# Patient Record
Sex: Male | Born: 1950 | Race: White | Hispanic: No | Smoking: Current every day smoker
Health system: Southern US, Community
[De-identification: ages and names within clinical notes are randomized; demographics above are authoritative.]

## PROBLEM LIST (undated history)

## (undated) DIAGNOSIS — L89306 Pressure-induced deep tissue damage of unspecified buttock: Secondary | ICD-10-CM

## (undated) DIAGNOSIS — I10 Essential (primary) hypertension: Secondary | ICD-10-CM

## (undated) DIAGNOSIS — F329 Major depressive disorder, single episode, unspecified: Secondary | ICD-10-CM

## (undated) DIAGNOSIS — E785 Hyperlipidemia, unspecified: Secondary | ICD-10-CM

## (undated) DIAGNOSIS — F32A Depression, unspecified: Secondary | ICD-10-CM

---

## 2014-04-15 ENCOUNTER — Ambulatory Visit: Payer: Self-pay | Admitting: Specialist

## 2014-04-15 DIAGNOSIS — I10 Essential (primary) hypertension: Secondary | ICD-10-CM

## 2014-04-15 LAB — BASIC METABOLIC PANEL
ANION GAP: 5 — AB (ref 7–16)
BUN: 10 mg/dL (ref 7–18)
CREATININE: 0.8 mg/dL (ref 0.60–1.30)
Calcium, Total: 9 mg/dL (ref 8.5–10.1)
Chloride: 104 mmol/L (ref 98–107)
Co2: 30 mmol/L (ref 21–32)
EGFR (African American): >60
EGFR (Non-African Amer.): >60
Glucose: 97 mg/dL (ref 65–99)
Osmolality: 277 (ref 275–301)
Potassium: 4.3 mmol/L (ref 3.5–5.1)
Sodium: 139 mmol/L (ref 136–145)

## 2014-04-15 LAB — PROTIME-INR
INR: 0.9
Prothrombin Time: 12.1 secs (ref 11.5–14.7)

## 2014-04-15 LAB — CBC
HCT: 45 % (ref 40.0–52.0)
HGB: 14.9 g/dL (ref 13.0–18.0)
MCH: 32.2 pg (ref 26.0–34.0)
MCHC: 33.2 g/dL (ref 32.0–36.0)
MCV: 97 fL (ref 80–100)
PLATELETS: 287 10*3/uL (ref 150–440)
RBC: 4.64 10*6/uL (ref 4.40–5.90)
RDW: 14.6 % — ABNORMAL HIGH (ref 11.5–14.5)
WBC: 10.1 10*3/uL (ref 3.8–10.6)

## 2014-04-15 LAB — APTT: ACTIVATED PTT: 25.2 s (ref 23.6–35.9)

## 2014-04-15 LAB — MRSA PCR SCREENING

## 2014-04-17 LAB — URINALYSIS, COMPLETE
BACTERIA: NONE SEEN
BILIRUBIN, UR: NEGATIVE
BLOOD: NEGATIVE
GLUCOSE, UR: NEGATIVE mg/dL (ref 0–75)
Leukocyte Esterase: NEGATIVE
NITRITE: NEGATIVE
Ph: 5 (ref 4.5–8.0)
Protein: NEGATIVE
RBC,UR: NONE SEEN /HPF (ref 0–5)
SPECIFIC GRAVITY: 1.029 (ref 1.003–1.030)
Squamous Epithelial: NONE SEEN
WBC UR: NONE SEEN /HPF (ref 0–5)

## 2018-01-14 ENCOUNTER — Emergency Department: Payer: Medicare PPO

## 2018-01-14 ENCOUNTER — Other Ambulatory Visit: Payer: Self-pay

## 2018-01-14 ENCOUNTER — Observation Stay
Admission: EM | Admit: 2018-01-14 | Discharge: 2018-01-18 | Disposition: A | Discharge status: Home / Self Care | Payer: Medicare PPO | Attending: Specialist | Admitting: Specialist

## 2018-01-14 ENCOUNTER — Encounter: Payer: Self-pay | Admitting: Intensive Care

## 2018-01-14 DIAGNOSIS — R062 Wheezing: Secondary | ICD-10-CM | POA: Diagnosis not present

## 2018-01-14 DIAGNOSIS — M79605 Pain in left leg: Secondary | ICD-10-CM

## 2018-01-14 DIAGNOSIS — I10 Essential (primary) hypertension: Secondary | ICD-10-CM | POA: Insufficient documentation

## 2018-01-14 DIAGNOSIS — Z803 Family history of malignant neoplasm of breast: Secondary | ICD-10-CM | POA: Diagnosis not present

## 2018-01-14 DIAGNOSIS — M25562 Pain in left knee: Secondary | ICD-10-CM | POA: Diagnosis not present

## 2018-01-14 DIAGNOSIS — S82102A Unspecified fracture of upper end of left tibia, initial encounter for closed fracture: Secondary | ICD-10-CM

## 2018-01-14 DIAGNOSIS — F17210 Nicotine dependence, cigarettes, uncomplicated: Secondary | ICD-10-CM | POA: Diagnosis not present

## 2018-01-14 DIAGNOSIS — M8580 Other specified disorders of bone density and structure, unspecified site: Secondary | ICD-10-CM | POA: Insufficient documentation

## 2018-01-14 DIAGNOSIS — G629 Polyneuropathy, unspecified: Secondary | ICD-10-CM | POA: Insufficient documentation

## 2018-01-14 DIAGNOSIS — M1612 Unilateral primary osteoarthritis, left hip: Secondary | ICD-10-CM | POA: Diagnosis not present

## 2018-01-14 DIAGNOSIS — S82192A Other fracture of upper end of left tibia, initial encounter for closed fracture: Principal | ICD-10-CM | POA: Insufficient documentation

## 2018-01-14 DIAGNOSIS — M1712 Unilateral primary osteoarthritis, left knee: Secondary | ICD-10-CM | POA: Diagnosis not present

## 2018-01-14 DIAGNOSIS — E785 Hyperlipidemia, unspecified: Secondary | ICD-10-CM | POA: Diagnosis not present

## 2018-01-14 DIAGNOSIS — Z9181 History of falling: Secondary | ICD-10-CM | POA: Diagnosis not present

## 2018-01-14 DIAGNOSIS — S82832A Other fracture of upper and lower end of left fibula, initial encounter for closed fracture: Secondary | ICD-10-CM | POA: Insufficient documentation

## 2018-01-14 DIAGNOSIS — Y9389 Activity, other specified: Secondary | ICD-10-CM | POA: Diagnosis not present

## 2018-01-14 DIAGNOSIS — S82209A Unspecified fracture of shaft of unspecified tibia, initial encounter for closed fracture: Secondary | ICD-10-CM | POA: Diagnosis present

## 2018-01-14 DIAGNOSIS — Z823 Family history of stroke: Secondary | ICD-10-CM | POA: Insufficient documentation

## 2018-01-14 DIAGNOSIS — Y92009 Unspecified place in unspecified non-institutional (private) residence as the place of occurrence of the external cause: Secondary | ICD-10-CM | POA: Diagnosis not present

## 2018-01-14 DIAGNOSIS — F329 Major depressive disorder, single episode, unspecified: Secondary | ICD-10-CM | POA: Diagnosis not present

## 2018-01-14 DIAGNOSIS — W3189XA Contact with other specified machinery, initial encounter: Secondary | ICD-10-CM | POA: Insufficient documentation

## 2018-01-14 DIAGNOSIS — W19XXXA Unspecified fall, initial encounter: Secondary | ICD-10-CM

## 2018-01-14 DIAGNOSIS — I252 Old myocardial infarction: Secondary | ICD-10-CM | POA: Insufficient documentation

## 2018-01-14 DIAGNOSIS — Z419 Encounter for procedure for purposes other than remedying health state, unspecified: Secondary | ICD-10-CM

## 2018-01-14 HISTORY — DX: Depression, unspecified: F32.A

## 2018-01-14 HISTORY — DX: Hyperlipidemia, unspecified: E78.5

## 2018-01-14 HISTORY — DX: Essential (primary) hypertension: I10

## 2018-01-14 HISTORY — DX: Major depressive disorder, single episode, unspecified: F32.9

## 2018-01-14 LAB — BASIC METABOLIC PANEL
ANION GAP: 12 (ref 5–15)
BUN: 21 mg/dL (ref 8–23)
CALCIUM: 9.1 mg/dL (ref 8.9–10.3)
CO2: 25 mmol/L (ref 22–32)
Chloride: 102 mmol/L (ref 98–111)
Creatinine, Ser: 0.96 mg/dL (ref 0.61–1.24)
GFR calc non Af Amer: >60 mL/min (ref >60)
Glucose, Bld: 111 mg/dL — ABNORMAL HIGH (ref 70–99)
Potassium: 3.8 mmol/L (ref 3.5–5.1)
Sodium: 139 mmol/L (ref 135–145)

## 2018-01-14 LAB — CBC
HEMATOCRIT: 40.6 % (ref 40.0–52.0)
Hemoglobin: 13.6 g/dL (ref 13.0–18.0)
MCH: 32.1 pg (ref 26.0–34.0)
MCHC: 33.4 g/dL (ref 32.0–36.0)
MCV: 96 fL (ref 80.0–100.0)
Platelets: 333 10*3/uL (ref 150–440)
RBC: 4.23 MIL/uL — AB (ref 4.40–5.90)
RDW: 14.3 % (ref 11.5–14.5)
WBC: 16.9 10*3/uL — AB (ref 3.8–10.6)

## 2018-01-14 MED ORDER — ACETAMINOPHEN 325 MG PO TABS: 650.0000 mg | ORAL_TABLET | Freq: Four times a day (QID) | ORAL | Status: DC | PRN

## 2018-01-14 MED ORDER — SODIUM CHLORIDE 0.9% FLUSH
3.0000 mL | Freq: Two times a day (BID) | INTRAVENOUS | Status: DC
  Administered 2018-01-14 – 2018-01-15 (×3): 3 mL via INTRAVENOUS

## 2018-01-14 MED ORDER — DULOXETINE HCL 60 MG PO CPEP
60.0000 mg | ORAL_CAPSULE | Freq: Every day | ORAL | Status: DC
  Administered 2018-01-15 – 2018-01-18 (×3): 60 mg via ORAL
  Filled 2018-01-14 (×4): qty 1

## 2018-01-14 MED ORDER — LISINOPRIL-HYDROCHLOROTHIAZIDE 20-12.5 MG PO TABS: 1.0000 | ORAL_TABLET | Freq: Every day | ORAL | Status: DC

## 2018-01-14 MED ORDER — HYDROMORPHONE HCL 1 MG/ML IJ SOLN
1.0000 mg | INTRAMUSCULAR | Status: AC
  Administered 2018-01-14: 1 mg via INTRAVENOUS
  Filled 2018-01-14: qty 1

## 2018-01-14 MED ORDER — ACETAMINOPHEN 650 MG RE SUPP: 650.0000 mg | Freq: Four times a day (QID) | RECTAL | Status: DC | PRN

## 2018-01-14 MED ORDER — MORPHINE SULFATE (PF) 4 MG/ML IV SOLN
4.0000 mg | Freq: Once | INTRAVENOUS | Status: AC
  Administered 2018-01-14: 4 mg via INTRAVENOUS
  Filled 2018-01-14: qty 1

## 2018-01-14 MED ORDER — BUPROPION HCL ER (SR) 150 MG PO TB12
150.0000 mg | ORAL_TABLET | Freq: Two times a day (BID) | ORAL | Status: DC
  Administered 2018-01-15 – 2018-01-16 (×2): 150 mg via ORAL
  Filled 2018-01-14 (×9): qty 1

## 2018-01-14 MED ORDER — NICOTINE 21 MG/24HR TD PT24
21.0000 mg | MEDICATED_PATCH | Freq: Every day | TRANSDERMAL | Status: DC
  Administered 2018-01-14 – 2018-01-18 (×4): 21 mg via TRANSDERMAL
  Filled 2018-01-14 (×5): qty 1

## 2018-01-14 MED ORDER — SODIUM CHLORIDE 0.9 % IV SOLN: 250.0000 mL | INTRAVENOUS | Status: DC | PRN

## 2018-01-14 MED ORDER — PRAVASTATIN SODIUM 20 MG PO TABS
20.0000 mg | ORAL_TABLET | Freq: Every day | ORAL | Status: DC
  Administered 2018-01-15 – 2018-01-17 (×3): 20 mg via ORAL
  Filled 2018-01-14 (×3): qty 1

## 2018-01-14 MED ORDER — HYDROCODONE-ACETAMINOPHEN 5-325 MG PO TABS
1.0000 | ORAL_TABLET | ORAL | Status: DC | PRN
  Administered 2018-01-14: 1 via ORAL
  Administered 2018-01-15 (×2): 2 via ORAL
  Filled 2018-01-14 (×2): qty 2
  Filled 2018-01-14: qty 1

## 2018-01-14 MED ORDER — LISINOPRIL 20 MG PO TABS
20.0000 mg | ORAL_TABLET | Freq: Every day | ORAL | Status: DC
  Administered 2018-01-15: 20 mg via ORAL
  Filled 2018-01-14: qty 1

## 2018-01-14 MED ORDER — KETOROLAC TROMETHAMINE 15 MG/ML IJ SOLN
15.0000 mg | Freq: Four times a day (QID) | INTRAMUSCULAR | Status: DC | PRN
  Administered 2018-01-14 – 2018-01-15 (×3): 15 mg via INTRAVENOUS
  Filled 2018-01-14 (×3): qty 1

## 2018-01-14 MED ORDER — SENNOSIDES-DOCUSATE SODIUM 8.6-50 MG PO TABS: 1.0000 | ORAL_TABLET | Freq: Every evening | ORAL | Status: DC | PRN

## 2018-01-14 MED ORDER — KETAMINE HCL 10 MG/ML IJ SOLN
0.3000 mg/kg | Freq: Once | INTRAMUSCULAR | Status: AC
  Administered 2018-01-14: 27 mg via INTRAVENOUS
  Filled 2018-01-14: qty 1

## 2018-01-14 MED ORDER — ONDANSETRON HCL 4 MG/2ML IJ SOLN
4.0000 mg | Freq: Once | INTRAMUSCULAR | Status: AC
  Administered 2018-01-14: 4 mg via INTRAVENOUS
  Filled 2018-01-14: qty 2

## 2018-01-14 MED ORDER — HYDROCHLOROTHIAZIDE 12.5 MG PO CAPS
12.5000 mg | ORAL_CAPSULE | Freq: Every day | ORAL | Status: DC
  Administered 2018-01-15: 12.5 mg via ORAL
  Filled 2018-01-14: qty 1

## 2018-01-14 MED ORDER — SODIUM CHLORIDE 0.9% FLUSH: 3.0000 mL | INTRAVENOUS | Status: DC | PRN

## 2018-01-14 MED ORDER — INFLUENZA VAC SPLIT HIGH-DOSE 0.5 ML IM SUSY
0.5000 mL | PREFILLED_SYRINGE | INTRAMUSCULAR | Status: DC
  Filled 2018-01-14: qty 0.5

## 2018-01-14 NOTE — ED Provider Notes (Addendum)
 -----------------------------------------   4:53 PM on 01/14/2018 -----------------------------------------  Still severe pain.  X-rays reveal a proximal fibular fracture.  No foot drop.  Will obtain x-ray of the knee.  Low-dose IV ketamine for ongoing analgesia.    Sharman Cheek, MD 01/14/18 1653   ----------------------------------------- 6:01 PM on 01/14/2018 -----------------------------------------  Pain improved after IV ketamine.  X-ray suspicious for tibial plateau fracture/transverse fracture of the tibial metaphysis with posterior displacement as well.  Will obtain CT knee for further characterization.   Sharman Cheek, MD 01/14/18 1801   ----------------------------------------- 8:51 PM on 01/14/2018 -----------------------------------------  CT shows impacted fracture of the proximal tibia as well as the fibular fracture.  Patient has baseline ambulatory dysfunction, uses a walker and has frequent falls.  With this injury his ability to care for himself at home is highly suspect.  Case discussed with the hospitalist for further management, pain control, Ortho/follow-up planning.  Plan knee immobilizer in the meantime.  Final diagnoses:  Left leg pain  Closed fracture of proximal end of left fibula, unspecified fracture morphology, initial encounter  Closed fracture of proximal end of left tibia, unspecified fracture morphology, initial encounter      Sharman Cheek, MD 01/14/18 2052

## 2018-01-14 NOTE — ED Notes (Signed)
Patient transported to CT 

## 2018-01-14 NOTE — ED Provider Notes (Signed)
[facility] Emergency Department Provider Note   ____________________________________________   First MD Initiated Contact with Patient 01/14/18 1415     (approximate)  I have reviewed the triage vital signs and the nursing notes.   HISTORY  Chief Complaint Fall    HPI GRAVES NIPP is a 67 y.o. 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  He is been having severe pain in the area of the left thigh and left knee since that time.  He fell back onto his buttock.  Denies any back pain.  No numbness tingling or weakness in the legs.  Denies injury to the right leg.  Did not lose consciousness did not strike his head.  Reports he was leaning to put something away in the rack fell with the microwave landing up on him.  No nausea vomiting.  No headache no neck pain no back pain.  Does report he takes a pain pill that he is prescribed at [facility] clinic, unsure of the name or dose and is taking it for a long time which he took about 2 hours ago   Past Medical History:  Diagnosis Date  . Depression   . Hyperlipidemia   . Hypertension     There are no active problems to display for this patient.   History reviewed. No pertinent surgical history.  Prior to Admission medications   Not on File    Allergies Patient has no known allergies.  History reviewed. No pertinent family history.  Social History Social History   Tobacco Use  . Smoking status: Current Every Day Smoker    Types: Cigarettes  . Smokeless tobacco: Never Used  Substance Use Topics  . Alcohol use: Never    Frequency: Never  . Drug use: Yes    Types: Marijuana    Review of Systems Constitutional: No fever/chills Eyes: No  visual changes. ENT: No sore throat. Cardiovascular: Denies chest pain. Respiratory: Denies shortness of breath. Gastrointestinal: No abdominal pain.   Genitourinary: Negative for dysuria. Musculoskeletal: Negative for back pain.  See HPI Skin: Negative for rash. Neurological: Negative for headaches, areas of focal weakness or numbness.    ____________________________________________   PHYSICAL EXAM:  VITAL SIGNS: ED Triage Vitals  Enc Vitals Group     BP 01/14/18 1347 124/75     Pulse Rate 01/14/18 1347 74     Resp 01/14/18 1347 13     Temp 01/14/18 1347 (!) 97.5 F (36.4 C)     Temp Source 01/14/18 1347 Oral     SpO2 01/14/18 1347 98 %     Weight 01/14/18 1349 199 lb (90.3 kg)     Height 01/14/18 1349 5\' 11"  (1.803 m)     Head Circumference --      Peak Flow --      Pain Score 01/14/18 1349 10     Pain Loc --      Pain Edu? --      Excl. in GC? --     Constitutional: Alert and oriented.  Sitting holding hand over her left mid thigh, appears in moderate to severe pain, especially with attempts to move the left leg. Eyes: Conjunctivae are normal. Head:  Atraumatic. Nose: No congestion/rhinnorhea. Mouth/Throat: Mucous membranes are moist. Neck: No stridor.  Cardiovascular: Normal rate, regular rhythm. Grossly normal heart sounds.  Good peripheral circulation. Respiratory: Normal respiratory effort.  No retractions. Lungs CTAB. Gastrointestinal: Soft and nontender. No distention. Musculoskeletal:   RIGHT Right upper extremity demonstrates normal strength, good use of all muscles. No edema bruising or contusions of the right shoulder/upper arm, right elbow, right forearm / hand. Full range of motion of the right right upper extremity without pain. No evidence of trauma. Strong radial pulse. Intact median/ulnar/radial neuro-muscular exam.  LEFT Left upper extremity demonstrates normal strength, good use of all muscles. No edema bruising or contusions of the left  shoulder/upper arm, left elbow, left forearm / hand. Full range of motion of the left  upper extremity without pain. No evidence of trauma. Strong radial pulse. Intact median/ulnar/radial neuro-muscular exam.   Lower Extremities  No edema. Normal DP/PT pulses bilateral with good cap refill.  Normal neuro-motor function lower extremities bilateral.  RIGHT Right lower extremity demonstrates normal strength, good use of all muscles. No edema bruising or contusions of the right hip, right knee, right ankle. Full range of motion of the right lower extremity without pain. No pain on axial loading. No evidence of trauma.  LEFT Left lower extremity demonstrates limited strength due to pain with slight tenderness over the left hip, left mid thigh and modest to severe tenderness overlying the distal left femur.  No obvious deformity or hematoma is denoted.  Some slight bruising over the anterior left mid to lower femur is notable without deformity.  Normal strength, good use of all muscles symptoms limited by pain without neurologic deficit denoted. No edema bruising or contusions of the hip, ankle, or foot.   Neurologic:  Normal speech and language. No gross focal neurologic deficits are appreciated.  Skin:  Skin is warm, dry and intact. No rash noted. Psychiatric: Mood and affect are normal. Speech and behavior are normal.  ____________________________________________   LABS (all labs ordered are listed, but only abnormal results are displayed)  Labs Reviewed  CBC - Abnormal; Notable for the following components:      Result Value   WBC 16.9 (*)    RBC 4.23 (*)    All other components within normal limits  BASIC METABOLIC PANEL - Abnormal; Notable for the following components:   Glucose, Bld 111 (*)    All other components within normal limits    ____________________________________________  EKG   ____________________________________________  RADIOLOGY   ____________________________________________   PROCEDURES  Procedure(s) performed: None  Procedures  Critical Care performed: No  ____________________________________________   INITIAL IMPRESSION / ASSESSMENT AND PLAN / ED COURSE  Pertinent labs & imaging results that were available during my care of the patient were reviewed by me and considered in my medical decision making (see chart for details).   Pain after microwave fell off cooking rack onto his left leg.  Majority of tenderness seems to be around the distal left femur without obvious injury or deformity about the knee or remaining left lower leg.  No back pain or injury.  No thoracic or lumbar tenderness  Notable pain, after first dose of morphine table patient unable to tolerate x-ray, given second dose of morphine with better effect.  Suspect musculoskeletal injury, no evidence of neurologic or vascular deficits.  No large hematoma and compartments are soft.  ----------------------------------------- 3:37 PM on 01/14/2018 -----------------------------------------  Patient pain is improved, reports feeling better on his way to x-ray and  appears more comfortable and fully alert.  Ongoing care assigned to Dr. Scotty Court, follow-up imaging studies of the left lower leg, reassess thereafter and if pain continued to be severe without clear x-ray finding will consider CT scan of the left femur.  I suspect musculoskeletal etiology.      ____________________________________________   FINAL CLINICAL IMPRESSION(S) / ED DIAGNOSES  Final diagnoses:  Left leg pain        Note:  This document was prepared using Dragon voice recognition software and may include unintentional dictation errors       Sharyn Creamer, MD 01/14/18 1538

## 2018-01-14 NOTE — H&P (Signed)
[facility] [place] at [facility]   PATIENT NAME: Alejandro Cook    MR#:  [id number]  DATE OF BIRTH:  1950/06/29  DATE OF ADMISSION:  01/14/2018  PRIMARY CARE PHYSICIAN: Leanna Sato, MD   REQUESTING/REFERRING PHYSICIAN:   CHIEF COMPLAINT:   Chief Complaint  Patient presents with  . Fall    HISTORY OF PRESENT ILLNESS: Alejandro Cook  is a 67 y.o. male with a known history of hypertension, hyperlipidemia, tobacco abuse was trying to load his bottom freezer.  The microwave on the top shelf displaced and fell on patient's left knee.  Patient has no swelling in the left knee and pain.  Pain was very severe aching and sharp in nature and 10 out of 10 on a scale of 1-10 when he presented to the emergency room.  IV ketamine relieve the pain.  Patient was worked up with x-ray imaging studies of the left knee along with CT scan of the knee which showed tibial and fibular fracture.  ER physician consulted orthopedics and hospitalist service.  PAST MEDICAL HISTORY:   Past Medical History:  Diagnosis Date  . Depression   . Hyperlipidemia   . Hypertension     PAST SURGICAL HISTORY: History reviewed. No pertinent surgical history.  SOCIAL HISTORY:  Social History   Tobacco Use  . Smoking status: Current Every Day Smoker    Types: Cigarettes  . Smokeless tobacco: Never Used  Substance Use Topics  . Alcohol use: Never    Frequency: Never    FAMILY HISTORY:  Family History  Problem Relation Age of Onset  . Breast cancer Mother   . CVA Father     DRUG ALLERGIES: No Known Allergies  REVIEW OF SYSTEMS:   CONSTITUTIONAL: No fever, fatigue or weakness.  EYES: No blurred or double vision.  EARS, NOSE, AND THROAT: No tinnitus or ear pain.  RESPIRATORY: No cough, shortness of breath, wheezing or hemoptysis.  CARDIOVASCULAR: No chest pain, orthopnea, edema.  GASTROINTESTINAL: No nausea, vomiting, diarrhea or abdominal pain.  GENITOURINARY: No dysuria,  hematuria.  ENDOCRINE: No polyuria, nocturia,  HEMATOLOGY: No anemia, easy bruising or bleeding SKIN: No rash or lesion. MUSCULOSKELETAL: Has left knee swelling and pain NEUROLOGIC: No tingling, numbness, weakness.  PSYCHIATRY: No anxiety or depression.   MEDICATIONS AT HOME:  Prior to Admission medications   Not on File      PHYSICAL EXAMINATION:   VITAL SIGNS: Blood pressure (!) 145/98, pulse 90, temperature (!) 97.5 F (36.4 C), temperature source Oral, resp. rate 20, height 5\' 11"  (1.803 m), weight 90.3 kg, SpO2 95 %.  GENERAL:  67 y.o.-year-old patient lying in the bed with no acute distress.  EYES: Pupils equal, round, reactive to light and accommodation. No scleral icterus. Extraocular muscles intact.  HEENT: Head atraumatic, normocephalic. Oropharynx and nasopharynx clear.  NECK:  Supple, no jugular venous distention. No thyroid enlargement, no tenderness.  LUNGS: Normal breath sounds bilaterally, no wheezing, rales,rhonchi or crepitation. No use of accessory muscles of respiration.  CARDIOVASCULAR: S1, S2 normal. No murmurs, rubs, or gallops.  ABDOMEN: Soft, nontender, nondistended. Bowel sounds present. No organomegaly or mass.  EXTREMITIES: No pedal edema, cyanosis, or clubbing.  Left knee swollen Tenderness present NEUROLOGIC: Cranial nerves II through XII are intact. Muscle strength 5/5 in all extremities. Sensation intact. Gait not checked.  PSYCHIATRIC: The patient is alert and oriented x 3.  SKIN: No obvious rash, lesion, or ulcer.   LABORATORY PANEL:   CBC Recent Labs  Lab 01/14/18 1435  WBC 16.9*  HGB 13.6  HCT 40.6  PLT 333  MCV 96.0  MCH 32.1  MCHC 33.4  RDW 14.3   ------------------------------------------------------------------------------------------------------------------  Chemistries  Recent Labs  Lab 01/14/18 1435  NA 139  K 3.8  CL 102  CO2 25  GLUCOSE 111*  BUN 21  CREATININE 0.96  CALCIUM 9.1    ------------------------------------------------------------------------------------------------------------------ estimated creatinine clearance is 78.4 mL/min (by C-G formula based on SCr of 0.96 mg/dL). ------------------------------------------------------------------------------------------------------------------ No results for input(s): TSH, T4TOTAL, T3FREE, THYROIDAB in the last 72 hours.  Invalid input(s): FREET3   Coagulation profile No results for input(s): INR, PROTIME in the last 168 hours. ------------------------------------------------------------------------------------------------------------------- No results for input(s): DDIMER in the last 72 hours. -------------------------------------------------------------------------------------------------------------------  Cardiac Enzymes No results for input(s): CKMB, TROPONINI, MYOGLOBIN in the last 168 hours.  Invalid input(s): CK ------------------------------------------------------------------------------------------------------------------ Invalid input(s): POCBNP  ---------------------------------------------------------------------------------------------------------------  Urinalysis    Component Value Date/Time   COLORURINE Yellow 04/17/2014 1601   APPEARANCEUR Turbid 04/17/2014 1601   LABSPEC 1.029 04/17/2014 1601   PHURINE 5.0 04/17/2014 1601   GLUCOSEU Negative 04/17/2014 1601   HGBUR Negative 04/17/2014 1601   BILIRUBINUR Negative 04/17/2014 1601   KETONESUR Trace 04/17/2014 1601   PROTEINUR Negative 04/17/2014 1601   NITRITE Negative 04/17/2014 1601   LEUKOCYTESUR Negative 04/17/2014 1601     RADIOLOGY: Dg Pelvis 1-2 Views  Result Date: 01/14/2018 CLINICAL DATA:  Pelvic pain since a shelf fell on the patient today. EXAM: PELVIS - 1-2 VIEW COMPARISON:  None. FINDINGS: No acute bony or joint abnormality is identified. Severe left hip osteoarthritis is seen. Extensive atherosclerosis is noted.  Colon projects far below the pubic bone suggestive of a large ventral hernia. IMPRESSION: Negative for fracture. Severe left hip osteoarthritis. Findings suggestive of a large ventral hernia. Electronically Signed   By: Drusilla Kanner M.D.   On: 01/14/2018 16:07   Dg Tibia/fibula Left  Result Date: 01/14/2018 CLINICAL DATA:  Onset left lower leg pain when a shelf fell on the patient today. Initial encounter. EXAM: LEFT TIBIA AND FIBULA - 2 VIEW COMPARISON:  None. FINDINGS: Nondisplaced fracture of the proximal diaphysis of the fibula is identified. No other fracture is seen. The patient has severe subtalar osteoarthritis. Extensive atherosclerosis is noted. IMPRESSION: Nondisplaced fracture proximal diaphysis of the fibula. If the patient has knee pain, dedicated plain films of the knee are recommended for further evaluation as the knee is incompletely evaluated on this exam. Severe subtalar arthritis. Atherosclerosis. Electronically Signed   By: Drusilla Kanner M.D.   On: 01/14/2018 16:06   Ct Knee Left Wo Contrast  Result Date: 01/14/2018 CLINICAL DATA:  Tibial and fibular fractures. EXAM: CT OF THE LEFT KNEE WITHOUT CONTRAST TECHNIQUE: Multidetector CT imaging of the LEFT knee was performed according to the standard protocol. Multiplanar CT image reconstructions were also generated. COMPARISON:  None. FINDINGS: Bones/Joint/Cartilage Fine bony detail is slightly limited due to marked osteopenia about the knee. No joint effusion is identified. The fracture involving the proximal tibial metaphysis as seen on the same day radiographs appears to be extra-articular extending to the level of the tibial tuberosity where there is partial avulsion of the tibial tuberosity. No patella alta is identified as the tibial tuberosity still maintains its relationship with the proximal tibia. Slight volar angulation of the distal main fracture fragment of the tibia is identified with probable slight anterior impaction. A  proximal diaphyseal oblique fibular fracture with slight anterior and medial displacement of the distal fracture fragment is  identified with roughly 1/4 shaft width. Ligaments Suboptimally assessed by CT. Muscles and Tendons Intact appearing extensor mechanism tendons. No intramuscular hemorrhage or mass. Soft tissues Mild subcutaneous soft tissue edema about the fracture. Atherosclerotic calcifications of the included popliteal and proximal tibial arteries. IMPRESSION: Osteopenic appearance of the left knee limiting fine bony detail. 1. The fracture involving the proximal tibial metaphysis extends to the level of the tibial tuberosity where there is partial avulsion noted. The fracture does not appear to extend into the knee joint as initially suspected radiographically. This is further supported by lack of a joint effusion. Slight anterior impaction of the tibial diaphysis upon the metaphysis is suggested on the sagittal images. Slight volar angulation is also noted of the tibial diaphysis. 2. Acute proximal fibular diaphyseal fracture with slight anterior and medial displacement of the main fracture fragment. Electronically Signed   By: Tollie Eth M.D.   On: 01/14/2018 19:55   Dg Knee Complete 4 Views Left  Result Date: 01/14/2018 CLINICAL DATA:  Fibular fracture EXAM: LEFT KNEE - COMPLETE 4+ VIEW COMPARISON:  LEFT tibial and fibular radiographs of 01/14/2018 FINDINGS: Diffuse osseous demineralization. Nondisplaced transverse fracture of the proximal LEFT fibular diaphysis with minimal apex medial angulation. Narrowing of knee joint spaces. Femur and patella appear grossly intact. Mildly displaced fracture of the proximal tibial metaphysis, extent poorly delineated due to degree of demineralization but at least involving the lateral plateau and suspect transversely at the metaphysis. Extensive atherosclerotic calcifications and regional soft tissue swelling. IMPRESSION: Transverse displaced fracture of the  proximal LEFT fibular diaphysis. Probable transverse metaphyseal fracture of the proximal LEFT tibia at with extension into the lateral plateau, poorly delineated due to degree of demineralization; CT of the LEFT knee recommended to characterize. Electronically Signed   By: Ulyses Southward M.D.   On: 01/14/2018 17:55   Dg Femur Min 2 Views Left  Result Date: 01/14/2018 CLINICAL DATA:  Diffuse left leg pain due to an injury today when a shelf fell on the patient. Initial encounter. EXAM: LEFT FEMUR 2 VIEWS COMPARISON:  None. FINDINGS: The patient has severe left hip osteoarthritis with bone-on-bone joint space narrowing and extensive subchondral sclerosis. No femur fracture is identified but the patient has a fracture of the proximal diaphysis of the fibula. Extensive atherosclerosis is noted. Colon projects below the pubic bones. IMPRESSION: Intact left femur. Partial visualization of a left fibular fracture. Severe osteoarthritis left hip. Extensive atherosclerosis. Findings highly suggestive of a large ventral hernia. Electronically Signed   By: Drusilla Kanner M.D.   On: 01/14/2018 16:05    EKG: Orders placed or performed during the hospital encounter of 01/14/18  . ED EKG  . ED EKG    IMPRESSION AND PLAN:  67 year old male patient with history of hypertension, hyperlipidemia, tobacco abuse was at home when the microwave got displaced and fell on his left knee  -Left tibial and fibular fracture Admit patient to medical floor Orthopedic surgery consultation Pain management with IV Toradol and oral Percocet N.p.o. after midnight for orthopedic surgery evaluation and procedure  -Tobacco abuse Tobacco cessation counseled to the patient for 6 minutes Nicotine patch offered  -Hypertension Stable Resume home meds  -DVT prophylaxis We will hold blood thinners for now as patient may need procedure to fix the fracture All the records are reviewed and case discussed with ED  provider. Management plans discussed with the patient, family and they are in agreement.  CODE STATUS:Full code    TOTAL TIME TAKING CARE OF THIS  PATIENT: 51 minutes.    Ihor Austin M.D on 01/14/2018 at 8:18 PM  Between 7am to 6pm - Pager - [phone]  After 6pm go to [email or web address] - password [email or web address] [email or web address]  [place] [facility]  Office  [phone]  CC: Primary care physician; Leanna Sato, MD

## 2018-01-14 NOTE — Progress Notes (Signed)
Advanced care plan. Purpose of the Encounter: CODE STATUS Parties in Attendance: Patient Patient's Decision Capacity: Good Subjective/Patient's story: Presented to emergency room with left knee pain after microwave fell on the knee Objective/Medical story Imaging studies in the emergency room showed a tibia and fibular fracture in the left knee Goals of care determination:  Advance care directives goals of care discussed Tobacco cessation discussed Patient wants everything done for now which includes CPR, intubation ventilator if the need arises CODE STATUS: Full code Time spent discussing advanced care planning: 16 minutes

## 2018-01-14 NOTE — ED Triage Notes (Addendum)
Patient arrived by EMS from home for fall. C/o pain to L knee after falling and a metal cooking rack fell on L knee. Patient has had problems for years with L hip and uses walker at home. Reports multiple falls due to L hip. A&O x4 during triage

## 2018-01-14 NOTE — Progress Notes (Signed)
Pt admitted to floor alert and oriented. Pt refusing to remove his pants and T-shirt for skin check. Pt refusing to wear knee immobilizer at this time. Pt has chronic pain baseline pain score is a 9.

## 2018-01-14 NOTE — ED Notes (Addendum)
Pt hollering out in room due to pain from L knee. This RN and tech Gerilyn Pilgrim tried to pull patient up in bed and he refused.

## 2018-01-15 LAB — URINALYSIS, ROUTINE W REFLEX MICROSCOPIC
Bilirubin Urine: NEGATIVE
Glucose, UA: NEGATIVE mg/dL
Hgb urine dipstick: NEGATIVE
Ketones, ur: 5 mg/dL — AB
LEUKOCYTES UA: NEGATIVE
NITRITE: NEGATIVE
PROTEIN: NEGATIVE mg/dL
Specific Gravity, Urine: 1.028 (ref 1.005–1.030)
pH: 5 (ref 5.0–8.0)

## 2018-01-15 LAB — BASIC METABOLIC PANEL
Anion gap: 5 (ref 5–15)
BUN: 22 mg/dL (ref 8–23)
CALCIUM: 8.7 mg/dL — AB (ref 8.9–10.3)
CO2: 29 mmol/L (ref 22–32)
CREATININE: 1.06 mg/dL (ref 0.61–1.24)
Chloride: 106 mmol/L (ref 98–111)
GFR calc Af Amer: >60 mL/min (ref >60)
GLUCOSE: 115 mg/dL — AB (ref 70–99)
Potassium: 3.9 mmol/L (ref 3.5–5.1)
SODIUM: 140 mmol/L (ref 135–145)

## 2018-01-15 LAB — CBC
HCT: 36.4 % — ABNORMAL LOW (ref 40.0–52.0)
Hemoglobin: 12.2 g/dL — ABNORMAL LOW (ref 13.0–18.0)
MCH: 32.6 pg (ref 26.0–34.0)
MCHC: 33.5 g/dL (ref 32.0–36.0)
MCV: 97.3 fL (ref 80.0–100.0)
PLATELETS: 303 10*3/uL (ref 150–440)
RBC: 3.74 MIL/uL — ABNORMAL LOW (ref 4.40–5.90)
RDW: 14.1 % (ref 11.5–14.5)
WBC: 11.5 10*3/uL — AB (ref 3.8–10.6)

## 2018-01-15 LAB — SURGICAL PCR SCREEN
MRSA, PCR: NEGATIVE
STAPHYLOCOCCUS AUREUS: NEGATIVE

## 2018-01-15 MED ORDER — MORPHINE SULFATE (PF) 2 MG/ML IV SOLN
2.0000 mg | INTRAVENOUS | Status: DC | PRN
  Administered 2018-01-16: 2 mg via INTRAVENOUS
  Filled 2018-01-15: qty 1

## 2018-01-15 MED ORDER — ENOXAPARIN SODIUM 40 MG/0.4ML ~~LOC~~ SOLN
40.0000 mg | Freq: Once | SUBCUTANEOUS | Status: DC
  Filled 2018-01-15: qty 0.4

## 2018-01-15 MED ORDER — CEFAZOLIN SODIUM-DEXTROSE 2-4 GM/100ML-% IV SOLN
2.0000 g | INTRAVENOUS | Status: DC
  Filled 2018-01-15: qty 100

## 2018-01-15 MED ORDER — MORPHINE SULFATE (PF) 2 MG/ML IV SOLN
2.0000 mg | Freq: Once | INTRAVENOUS | Status: AC
  Administered 2018-01-15: 2 mg via INTRAVENOUS
  Filled 2018-01-15: qty 1

## 2018-01-15 MED ORDER — METHOCARBAMOL 500 MG PO TABS
500.0000 mg | ORAL_TABLET | Freq: Four times a day (QID) | ORAL | Status: DC | PRN
  Administered 2018-01-15 – 2018-01-16 (×3): 500 mg via ORAL
  Filled 2018-01-15 (×3): qty 1

## 2018-01-15 MED ORDER — INFLUENZA VAC SPLIT HIGH-DOSE 0.5 ML IM SUSY: 0.5000 mL | PREFILLED_SYRINGE | INTRAMUSCULAR | Status: DC

## 2018-01-15 MED ORDER — CEFAZOLIN SODIUM-DEXTROSE 2-4 GM/100ML-% IV SOLN
2.0000 g | INTRAVENOUS | Status: AC
  Administered 2018-01-16: 2 g via INTRAVENOUS
  Filled 2018-01-15: qty 100

## 2018-01-15 MED ORDER — OXYCODONE HCL 5 MG PO TABS
5.0000 mg | ORAL_TABLET | ORAL | Status: DC | PRN
  Administered 2018-01-15: 5 mg via ORAL
  Administered 2018-01-15 – 2018-01-16 (×3): 10 mg via ORAL
  Filled 2018-01-15 (×3): qty 2
  Filled 2018-01-15: qty 1

## 2018-01-15 MED ORDER — MUPIROCIN 2 % EX OINT
1.0000 "application " | TOPICAL_OINTMENT | Freq: Two times a day (BID) | CUTANEOUS | Status: DC
  Filled 2018-01-15: qty 22

## 2018-01-15 NOTE — Consult Note (Signed)
ORTHOPAEDIC CONSULTATION  REQUESTING PHYSICIAN: Houston Siren, MD  Chief Complaint: Left tibia and fibula fracture  HPI: Alejandro Cook is a 67 y.o. male who complains of pain in the left knee after a fall at home yesterday.  A shelf collapsed and a microwave fell onto his left knee.  He was brought to the ER and was diagnosed with a proximal tibia and fibula fracture by xray and CT.  Patient is seen in his hospital room today with his son at the bedside.  Patient explains that he has advanced osteoarthritis in the left knee and has limited range of motion at baseline.  He states he is unable to fully straighten the knee even at baseline.  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  Past Medical History:  Diagnosis Date  . Depression   . Hyperlipidemia   . Hypertension    History reviewed. No pertinent surgical history. Social History   Socioeconomic History  . Marital status: Divorced    Spouse name: Not on file  . Number of children: Not on file  . Years of education: Not on file  . Highest education level: Not on file  Occupational History    Employer: DISABLED  Social Needs  . Financial resource strain: Not on file  . Food insecurity:    Worry: Not on file    Inability: Not on file  . Transportation needs:    Medical: Not on file    Non-medical: Not on file  Tobacco Use  . Smoking status: Current Every Day Smoker    Types: Cigarettes  . Smokeless tobacco: Never Used  Substance and Sexual Activity  . Alcohol use: Never    Frequency: Never  . Drug use: Yes    Types: Marijuana  . Sexual activity: Not on file  Lifestyle  . Physical activity:    Days per week: Not on file    Minutes per session: Not on file  . Stress: Not on file  Relationships  . Social connections:    Talks on phone: Not on file    Gets together: Not on file    Attends religious service: Not on file    Active member of club or organization: Not on file    Attends meetings  of clubs or organizations: Not on file    Relationship status: Not on file  Other Topics Concern  . Not on file  Social History Narrative  . Not on file   Family History  Problem Relation Age of Onset  . Breast cancer Mother   . CVA Father    No Known Allergies Prior to Admission medications   Medication Sig Start Date End Date Taking? Authorizing Provider  diclofenac (VOLTAREN) 75 MG EC tablet Take 1 tablet by mouth 2 (two) times daily. 11/20/17  Yes [provider]  DULoxetine (CYMBALTA) 60 MG capsule Take 1 capsule by mouth daily. 12/19/17  Yes [provider]  lisinopril-hydrochlorothiazide (PRINZIDE,ZESTORETIC) 20-12.5 MG tablet Take 1 tablet by mouth daily. 12/19/17  Yes [provider]  lovastatin (MEVACOR) 40 MG tablet Take 1 tablet by mouth daily.   Yes [provider]  buPROPion (WELLBUTRIN SR) 150 MG 12 hr tablet Take 1 tablet by mouth 2 (two) times daily. 11/08/17   [provider]   Dg Pelvis 1-2 Views  Result Date: 01/14/2018 CLINICAL DATA:  Pelvic pain since a shelf fell on the patient today. EXAM: PELVIS - 1-2 VIEW COMPARISON:  None. FINDINGS: No acute bony or joint abnormality is identified. Severe left hip osteoarthritis is seen. Extensive atherosclerosis is noted. Colon projects far below the pubic bone suggestive of a large ventral hernia. IMPRESSION: Negative for fracture. Severe left hip osteoarthritis. Findings suggestive of a large ventral hernia. Electronically Signed   By: Drusilla Kanner M.D.   On: 01/14/2018 16:07   Dg Tibia/fibula Left  Result Date: 01/14/2018 CLINICAL DATA:  Onset left lower leg pain when a shelf fell on the patient today. Initial encounter. EXAM: LEFT TIBIA AND FIBULA - 2 VIEW COMPARISON:  None. FINDINGS: Nondisplaced fracture of the proximal diaphysis of the fibula is identified. No other fracture is seen. The patient has severe subtalar osteoarthritis. Extensive atherosclerosis is noted. IMPRESSION:  Nondisplaced fracture proximal diaphysis of the fibula. If the patient has knee pain, dedicated plain films of the knee are recommended for further evaluation as the knee is incompletely evaluated on this exam. Severe subtalar arthritis. Atherosclerosis. Electronically Signed   By: Drusilla Kanner M.D.   On: 01/14/2018 16:06   Ct Knee Left Wo Contrast  Result Date: 01/14/2018 CLINICAL DATA:  Tibial and fibular fractures. EXAM: CT OF THE LEFT KNEE WITHOUT CONTRAST TECHNIQUE: Multidetector CT imaging of the LEFT knee was performed according to the standard protocol. Multiplanar CT image reconstructions were also generated. COMPARISON:  None. FINDINGS: Bones/Joint/Cartilage Fine bony detail is slightly limited due to marked osteopenia about the knee. No joint effusion is identified. The fracture involving the proximal tibial metaphysis as seen on the same day radiographs appears to be extra-articular extending to the level of the tibial tuberosity where there is partial avulsion of the tibial tuberosity. No patella alta is identified as the tibial tuberosity still maintains its relationship with the proximal tibia. Slight volar angulation of the distal main fracture fragment of the tibia is identified with probable slight anterior impaction. A proximal diaphyseal oblique fibular fracture with slight anterior and medial displacement of the distal fracture fragment is identified with roughly 1/4 shaft width. Ligaments Suboptimally assessed by CT. Muscles and Tendons Intact appearing extensor mechanism tendons. No intramuscular hemorrhage or mass. Soft tissues Mild subcutaneous soft tissue edema about the fracture. Atherosclerotic calcifications of the included popliteal and proximal tibial arteries. IMPRESSION: Osteopenic appearance of the left knee limiting fine bony detail. 1. The fracture involving the proximal tibial metaphysis extends to the level of the tibial tuberosity where there is partial avulsion noted.  The fracture does not appear to extend into the knee joint as initially suspected radiographically. This is further supported by lack of a joint effusion. Slight anterior impaction of the tibial diaphysis upon the metaphysis is suggested on the sagittal images. Slight volar angulation is also noted of the tibial diaphysis. 2. Acute proximal fibular diaphyseal fracture with slight anterior and medial displacement of the main fracture fragment. Electronically Signed   By: Tollie Eth M.D.   On: 01/14/2018 19:55   Dg Knee Complete 4 Views Left  Result Date: 01/14/2018 CLINICAL DATA:  Fibular fracture EXAM: LEFT KNEE - COMPLETE 4+ VIEW COMPARISON:  LEFT tibial and fibular radiographs of 01/14/2018 FINDINGS: Diffuse osseous demineralization. Nondisplaced transverse fracture of the proximal LEFT fibular diaphysis with minimal apex medial angulation. Narrowing of knee joint spaces. Femur and patella appear grossly intact. Mildly displaced fracture of the proximal tibial metaphysis, extent poorly delineated due to degree of demineralization but at least involving the lateral plateau and suspect transversely at the metaphysis. Extensive atherosclerotic calcifications and regional soft tissue swelling. IMPRESSION:  Transverse displaced fracture of the proximal LEFT fibular diaphysis. Probable transverse metaphyseal fracture of the proximal LEFT tibia at with extension into the lateral plateau, poorly delineated due to degree of demineralization; CT of the LEFT knee recommended to characterize. Electronically Signed   By: Ulyses Southward M.D.   On: 01/14/2018 17:55   Dg Femur Min 2 Views Left  Result Date: 01/14/2018 CLINICAL DATA:  Diffuse left leg pain due to an injury today when a shelf fell on the patient. Initial encounter. EXAM: LEFT FEMUR 2 VIEWS COMPARISON:  None. FINDINGS: The patient has severe left hip osteoarthritis with bone-on-bone joint space narrowing and extensive subchondral sclerosis. No femur fracture is  identified but the patient has a fracture of the proximal diaphysis of the fibula. Extensive atherosclerosis is noted. Colon projects below the pubic bones. IMPRESSION: Intact left femur. Partial visualization of a left fibular fracture. Severe osteoarthritis left hip. Extensive atherosclerosis. Findings highly suggestive of a large ventral hernia. Electronically Signed   By: Drusilla Kanner M.D.   On: 01/14/2018 16:05    Positive ROS: All other systems have been reviewed and were otherwise negative with the exception of those mentioned in the HPI and as above.  Physical Exam: General: Alert, no acute distress  MUSCULOSKELETAL: Left lower extremity: Patient has his left knee flex to approximately 60 degrees.  His hip is externally rotated.  He has intact skin.  He has swelling of the left knee and proximal leg without erythema or ecchymosis.  Compartments are soft and compressible.  He has intact motor function.  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  The x-rays and CT scan are limited due to the patient's knee range of motion.  Is difficult to determine the displacement of these fractures.  Patient states he has at least 20 degrees of flexion contracture at baseline.  He can put his toes to the ground on the left side but his heel does not touch the ground.  I recommended that we bring the patient to the OR tomorrow.  I would recommend trying to straighten his left knee as far as possible.  At that point we will take the arm images of the fracture.  If it is minimally displaced the patient would like to avoid surgery.  A splint or cast will be applied.  If the fracture is displaced then an open reduction internal fixation would be performed.  I reviewed the plan for surgery, the details of  the operation and the postoperative course with the patient and his son.  They are in agreement with this plan.  Patient will discontinue his Lovenox in preparation for surgery.  Will be n.p.o. after midnight.  Secondarily the patient will require left hip replacement and likely left knee replacement in the future once his fracture has healed.    Juanell Fairly, MD    01/15/2018 1:45 PM

## 2018-01-15 NOTE — NC FL2 (Signed)
[place] MEDICAID FL2 LEVEL OF CARE SCREENING TOOL     IDENTIFICATION  Patient Name: Alejandro Cook Birthdate: 1950/06/14 Sex: male Admission Date (Current Location): 01/14/2018  [place] and [place] Number:  Chiropodist and Address:  [facility], [address] [place], [place] [address]      Provider Number: [id number]  Attending Physician Name and Address:  Houston Siren, MD  Relative Name and Phone Number:       Current Level of Care: Hospital Recommended Level of Care: Skilled Nursing Facility Prior Approval Number:    Date Approved/Denied:   PASRR Number: [id number])  Discharge Plan: SNF    Current Diagnoses: Patient Active Problem List   Diagnosis Date Noted  . Tibia fracture 01/14/2018    Orientation RESPIRATION BLADDER Height & Weight     Self, Time, Situation, Place  Normal Continent Weight: 200 lb 1.6 oz (90.8 kg) Height:  5\' 11"  (180.3 cm)  BEHAVIORAL SYMPTOMS/MOOD NEUROLOGICAL BOWEL NUTRITION STATUS      Continent Diet(Diet: Regular )  AMBULATORY STATUS COMMUNICATION OF NEEDS Skin   Extensive Assist Verbally Normal                       Personal Care Assistance Level of Assistance  Bathing, Feeding, Dressing Bathing Assistance: Limited assistance Feeding assistance: Independent Dressing Assistance: Limited assistance     Functional Limitations Info  Sight, Hearing, Speech Sight Info: Adequate Hearing Info: Adequate Speech Info: Adequate    SPECIAL CARE FACTORS FREQUENCY  PT (By licensed PT), OT (By licensed OT)     PT Frequency: (5) OT Frequency: (5)            Contractures      Additional Factors Info  Code Status, Allergies Code Status Info: (Full Code. ) Allergies Info: (No Known Allergies. )           Current Medications (01/15/2018):  This is the current hospital active medication list Current Facility-Administered Medications  Medication Dose Route Frequency Provider Last  Rate Last Dose  . 0.9 %  sodium chloride infusion  250 mL Intravenous PRN Pyreddy, Vivien Rota, MD      . acetaminophen (TYLENOL) tablet 650 mg  650 mg Oral Q6H PRN Pyreddy, Vivien Rota, MD       Or  . acetaminophen (TYLENOL) suppository 650 mg  650 mg Rectal Q6H PRN Ihor Austin, MD      . buPROPion (WELLBUTRIN SR) 12 hr tablet 150 mg  150 mg Oral BID Ihor Austin, MD   150 mg at 01/15/18 0811  . DULoxetine (CYMBALTA) DR capsule 60 mg  60 mg Oral Daily Ihor Austin, MD   60 mg at 01/15/18 0811  . enoxaparin (LOVENOX) injection 40 mg  40 mg Subcutaneous Once Sainani, Vivek J, MD      . lisinopril (PRINIVIL,ZESTRIL) tablet 20 mg  20 mg Oral Daily Levi Aland A, RPH   20 mg at 01/15/18 0810   Or  . hydrochlorothiazide (MICROZIDE) capsule 12.5 mg  12.5 mg Oral Daily Levi Aland A, RPH   12.5 mg at 01/15/18 0809  . HYDROcodone-acetaminophen (NORCO/VICODIN) 5-325 MG per tablet 1-2 tablet  1-2 tablet Oral Q4H PRN Ihor Austin, MD   2 tablet at 01/15/18 0808  . [START ON 01/17/2018] Influenza vac split quadrivalent PF (FLUZONE HIGH-DOSE) injection 0.5 mL  0.5 mL Intramuscular Tomorrow-1000 Sainani, Vivek J, MD      . ketorolac (TORADOL) 15 MG/ML injection 15 mg  15  mg Intravenous Q6H PRN Ihor Austin, MD   15 mg at 01/15/18 1153  . methocarbamol (ROBAXIN) tablet 500 mg  500 mg Oral Q6H PRN Houston Siren, MD   500 mg at 01/15/18 4098  . mupirocin ointment (BACTROBAN) 2 % 1 application  1 application Nasal BID Juanell Fairly, MD      . nicotine (NICODERM CQ - dosed in mg/24 hours) patch 21 mg  21 mg Transdermal Daily Pyreddy, Vivien Rota, MD   21 mg at 01/15/18 0818  . pravastatin (PRAVACHOL) tablet 20 mg  20 mg Oral q1800 Pyreddy, Vivien Rota, MD      . senna-docusate (Senokot-S) tablet 1 tablet  1 tablet Oral QHS PRN Pyreddy, Vivien Rota, MD      . sodium chloride flush (NS) 0.9 % injection 3 mL  3 mL Intravenous Q12H Ihor Austin, MD   3 mL at 01/14/18 2111  . sodium chloride flush (NS) 0.9 %  injection 3 mL  3 mL Intravenous PRN Ihor Austin, MD         Discharge Medications: Please see discharge summary for a list of discharge medications.  Relevant Imaging Results:  Relevant Lab Results:   Additional Information (SSN: [id number])  Vlad Mayberry, Darleen Crocker, LCSW

## 2018-01-15 NOTE — Care Management Obs Status (Signed)
MEDICARE OBSERVATION STATUS NOTIFICATION   Patient Details  Name: RANDELL TEARE MRN: [id number] Date of Birth: 1950/11/30   Medicare Observation Status Notification Given:  Yes Permission to X    Gwenette Greet, RN 01/15/2018, 2:39 PM

## 2018-01-15 NOTE — Care Management CC44 (Signed)
Condition Code 44 Documentation Completed  Patient Details  Name: Alejandro Cook MRN: [id number] Date of Birth: 12/24/1950   Condition Code 44 given:  Yes Patient signature on Condition Code 44 notice:  Yes Documentation of 2 MD's agreement:  Yes Code 44 added to claim:  Yes    Gwenette Greet, RN 01/15/2018, 2:39 PM

## 2018-01-15 NOTE — Consult Note (Signed)
Aware of consult.  Patient admitted to floor with tibia/fibula fracture.   I was never contacted about this patient from the ED.  Will see the patient later today.   Patient's fracture in decent alignment and may not require surgery.   I will discuss options with the patient.   He is to be NON WEIGHTBEARING on the left lower extremity.  He may eat now.  If he elects for surgical management, he will be added to the OR schedule TOMORROW.

## 2018-01-15 NOTE — Clinical Social Work Note (Signed)
Clinical Social Work Assessment  Patient Details  Name: Alejandro Cook MRN: [id number] Date of Birth: 10-21-1950  Date of referral:  01/15/18               Reason for consult:  Facility Placement                Permission sought to share information with:    Permission granted to share information::     Name::        Agency::     Relationship::     Contact Information:     Housing/Transportation Living arrangements for the past 2 months:  Single Family Home Source of Information:  Patient Patient Interpreter Needed:  None Criminal Activity/Legal Involvement Pertinent to Current Situation/Hospitalization:  No - Comment as needed Significant Relationships:  Adult Children Lives with:  Self Do you feel safe going back to the place where you live?  Yes Need for family participation in patient care:  Yes (Comment)  Care giving concerns:  Patient lives alone in [place].    Facilities manager / plan:  Holiday representative ([place]) reviewed chart and noted that patient has a tibia fracture. PT is pending. CSW met with patient alone at bedside to discuss D/C plan. Patient was alert and oriented X4 and was laying in the bed. CSW introduced self and explained role of CSW department. Per patient he lives alone in [place]. CSW explained that PT will evaluate him and make a recommendation of home health or SNF. Per patient he prefers to go home and does not want SNF. Patient requested home health and a wheel chair. Per patient his son Alejandro Cook can check on him and he has a male friend that goes to the grocery store for him. RN case manager aware of above. CSW will continue to follow and assist as needed.   Employment status:  Retired Nurse, adult PT Recommendations:  Not assessed at this time Information / Referral to community resources:  Other (Comment Required)(Patient prefers to D/C home with home health. )  Patient/Family's Response to care:  Patient prefers to  D/C home.   Patient/Family's Understanding of and Emotional Response to Diagnosis, Current Treatment, and Prognosis:  Patient was very pleasant and thanked CSW for assistance.   Emotional Assessment Appearance:  Appears stated age Attitude/Demeanor/Rapport:    Affect (typically observed):  Accepting, Adaptable, Pleasant Orientation:  Oriented to Self, Oriented to Place, Oriented to  Time, Oriented to Situation Alcohol / Substance use:  Not Applicable Psych involvement (Current and /or in the community):  No (Comment)  Discharge Needs  Concerns to be addressed:  Discharge Planning Concerns Readmission within the last 30 days:  No Current discharge risk:  Dependent with Mobility Barriers to Discharge:  Continued Medical Work up   [employer], Veronia Beets, LCSW 01/15/2018, 12:36 PM

## 2018-01-15 NOTE — Progress Notes (Signed)
   01/15/18 1100  Clinical Encounter Type  Visited With Health care provider;Family (Son, Clint)  Visit Type Initial (AD HCPOA)  Referral From Nurse   The patient's nurse called to ask for support in securing a HCPOA for the patient. To facilitate the request, Chaplain called the patient's son Clint [phone]) to arrange an appt. for AD at 1300.

## 2018-01-15 NOTE — Progress Notes (Signed)
[facility] - [place] at [facility]      PATIENT NAME: Alejandro Cook    MR#:  [id number]  DATE OF BIRTH:  [birth year]  SUBJECTIVE:   Patient here due to a mechanical fall and noted to have a left tibial and fibular fracture.  Patient still having significant pain in that left leg, discussed with orthopedics and they will try to take the patient to the OR tomorrow to reduce the fracture and consider surgery or place the patient in a cast if the fractures are aligned well.  REVIEW OF SYSTEMS:    Review of Systems  Constitutional: Negative for chills and fever.  HENT: Negative for congestion and tinnitus.   Eyes: Negative for blurred vision and double vision.  Respiratory: Negative for cough, shortness of breath and wheezing.   Cardiovascular: Negative for chest pain, orthopnea and PND.  Gastrointestinal: Negative for abdominal pain, diarrhea, nausea and vomiting.  Genitourinary: Negative for dysuria and hematuria.  Musculoskeletal: Positive for falls and joint pain (Left leg pain).  Neurological: Negative for dizziness, sensory change and focal weakness.  All other systems reviewed and are negative.   Nutrition: Regular and NPO after midnight Tolerating Diet: Yes Tolerating PT: Await Eval.   DRUG ALLERGIES:  No Known Allergies  VITALS:  Blood pressure 129/80, pulse 75, temperature 98.2 F (36.8 C), temperature source Oral, resp. rate 18, height 5\' 11"  (1.803 m), weight 90.8 kg, SpO2 99 %.  PHYSICAL EXAMINATION:   Physical Exam  GENERAL:  67 y.o.-year-old patient lying in bed in no acute distress.  EYES: Pupils equal, round, reactive to light and accommodation. No scleral icterus. Extraocular muscles intact.  HEENT: Head atraumatic, normocephalic. Oropharynx and nasopharynx clear.  NECK:  Supple, no jugular venous distention. No thyroid enlargement, no tenderness.  LUNGS: Normal breath sounds bilaterally, no wheezing, rales, rhonchi. No use of accessory  muscles of respiration.  CARDIOVASCULAR: S1, S2 normal. No murmurs, rubs, or gallops.  ABDOMEN: Soft, nontender, nondistended. Bowel sounds present. No organomegaly or mass.  EXTREMITIES: No cyanosis, clubbing or edema b/l. Left leg in a contracted lateral position due to fracture.    NEUROLOGIC: Cranial nerves II through XII are intact. No focal Motor or sensory deficits b/l.  PSYCHIATRIC: The patient is alert and oriented x 3.  SKIN: No obvious rash, lesion, or ulcer.    LABORATORY PANEL:   CBC Recent Labs  Lab 01/15/18 0317  WBC 11.5*  HGB 12.2*  HCT 36.4*  PLT 303   ------------------------------------------------------------------------------------------------------------------  Chemistries  Recent Labs  Lab 01/15/18 0317  NA 140  K 3.9  CL 106  CO2 29  GLUCOSE 115*  BUN 22  CREATININE 1.06  CALCIUM 8.7*   ------------------------------------------------------------------------------------------------------------------  Cardiac Enzymes No results for input(s): TROPONINI in the last 168 hours. ------------------------------------------------------------------------------------------------------------------  RADIOLOGY:  Dg Pelvis 1-2 Views  Result Date: 01/14/2018 CLINICAL DATA:  Pelvic pain since a shelf fell on the patient today. EXAM: PELVIS - 1-2 VIEW COMPARISON:  None. FINDINGS: No acute bony or joint abnormality is identified. Severe left hip osteoarthritis is seen. Extensive atherosclerosis is noted. Colon projects far below the pubic bone suggestive of a large ventral hernia. IMPRESSION: Negative for fracture. Severe left hip osteoarthritis. Findings suggestive of a large ventral hernia. Electronically Signed   By: Drusilla Kanner M.D.   On: 01/14/2018 16:07   Dg Tibia/fibula Left  Result Date: 01/14/2018 CLINICAL DATA:  Onset left lower leg pain when a shelf fell on the patient today. Initial encounter. EXAM:  LEFT TIBIA AND FIBULA - 2 VIEW COMPARISON:   None. FINDINGS: Nondisplaced fracture of the proximal diaphysis of the fibula is identified. No other fracture is seen. The patient has severe subtalar osteoarthritis. Extensive atherosclerosis is noted. IMPRESSION: Nondisplaced fracture proximal diaphysis of the fibula. If the patient has knee pain, dedicated plain films of the knee are recommended for further evaluation as the knee is incompletely evaluated on this exam. Severe subtalar arthritis. Atherosclerosis. Electronically Signed   By: Drusilla Kanner M.D.   On: 01/14/2018 16:06   Ct Knee Left Wo Contrast  Result Date: 01/14/2018 CLINICAL DATA:  Tibial and fibular fractures. EXAM: CT OF THE LEFT KNEE WITHOUT CONTRAST TECHNIQUE: Multidetector CT imaging of the LEFT knee was performed according to the standard protocol. Multiplanar CT image reconstructions were also generated. COMPARISON:  None. FINDINGS: Bones/Joint/Cartilage Fine bony detail is slightly limited due to marked osteopenia about the knee. No joint effusion is identified. The fracture involving the proximal tibial metaphysis as seen on the same day radiographs appears to be extra-articular extending to the level of the tibial tuberosity where there is partial avulsion of the tibial tuberosity. No patella alta is identified as the tibial tuberosity still maintains its relationship with the proximal tibia. Slight volar angulation of the distal main fracture fragment of the tibia is identified with probable slight anterior impaction. A proximal diaphyseal oblique fibular fracture with slight anterior and medial displacement of the distal fracture fragment is identified with roughly 1/4 shaft width. Ligaments Suboptimally assessed by CT. Muscles and Tendons Intact appearing extensor mechanism tendons. No intramuscular hemorrhage or mass. Soft tissues Mild subcutaneous soft tissue edema about the fracture. Atherosclerotic calcifications of the included popliteal and proximal tibial arteries.  IMPRESSION: Osteopenic appearance of the left knee limiting fine bony detail. 1. The fracture involving the proximal tibial metaphysis extends to the level of the tibial tuberosity where there is partial avulsion noted. The fracture does not appear to extend into the knee joint as initially suspected radiographically. This is further supported by lack of a joint effusion. Slight anterior impaction of the tibial diaphysis upon the metaphysis is suggested on the sagittal images. Slight volar angulation is also noted of the tibial diaphysis. 2. Acute proximal fibular diaphyseal fracture with slight anterior and medial displacement of the main fracture fragment. Electronically Signed   By: Tollie Eth M.D.   On: 01/14/2018 19:55   Dg Knee Complete 4 Views Left  Result Date: 01/14/2018 CLINICAL DATA:  Fibular fracture EXAM: LEFT KNEE - COMPLETE 4+ VIEW COMPARISON:  LEFT tibial and fibular radiographs of 01/14/2018 FINDINGS: Diffuse osseous demineralization. Nondisplaced transverse fracture of the proximal LEFT fibular diaphysis with minimal apex medial angulation. Narrowing of knee joint spaces. Femur and patella appear grossly intact. Mildly displaced fracture of the proximal tibial metaphysis, extent poorly delineated due to degree of demineralization but at least involving the lateral plateau and suspect transversely at the metaphysis. Extensive atherosclerotic calcifications and regional soft tissue swelling. IMPRESSION: Transverse displaced fracture of the proximal LEFT fibular diaphysis. Probable transverse metaphyseal fracture of the proximal LEFT tibia at with extension into the lateral plateau, poorly delineated due to degree of demineralization; CT of the LEFT knee recommended to characterize. Electronically Signed   By: Ulyses Southward M.D.   On: 01/14/2018 17:55   Dg Femur Min 2 Views Left  Result Date: 01/14/2018 CLINICAL DATA:  Diffuse left leg pain due to an injury today when a shelf fell on the  patient. Initial encounter. EXAM: LEFT  FEMUR 2 VIEWS COMPARISON:  None. FINDINGS: The patient has severe left hip osteoarthritis with bone-on-bone joint space narrowing and extensive subchondral sclerosis. No femur fracture is identified but the patient has a fracture of the proximal diaphysis of the fibula. Extensive atherosclerosis is noted. Colon projects below the pubic bones. IMPRESSION: Intact left femur. Partial visualization of a left fibular fracture. Severe osteoarthritis left hip. Extensive atherosclerosis. Findings highly suggestive of a large ventral hernia. Electronically Signed   By: Drusilla Kanner M.D.   On: 01/14/2018 16:05     ASSESSMENT AND PLAN:   67 year old male with past medical history of hypertension, hyperlipidemia, depression who presented to the hospital after mechanical fall and noted to have a left fibular and tibial fracture.  1.  Status post fall and left tibial and fibular fracture- seen by orthopedics, and the plan for possible reduction in the OR tomorrow and if fractures are not aligned consider surgical intervention. -Continue supportive care with pain control.  Patient is n.p.o. after midnight. -Discussed with Dr. Martha Clan.  2.  Essential hypertension-continue lisinopril/HCTZ.  3.  Hyperlipidemia-continue Pravachol.  4.  Depression-continue Cymbalta, Wellbutrin.   All the records are reviewed and case discussed with Care Management/Social Worker. Management plans discussed with the patient, family and they are in agreement.  CODE STATUS: Full code  DVT Prophylaxis: Lovenox  TOTAL TIME TAKING CARE OF THIS PATIENT: 30 minutes.   POSSIBLE D/C IN 1-2 DAYS, DEPENDING ON CLINICAL CONDITION.   Houston Siren M.D on 01/15/2018 at 2:53 PM  Between 7am to 6pm - Pager - [phone]  After 6pm go to [email or web address] - Social research officer, government  [facility]  Office  [phone]  CC: Primary care physician; Leanna Sato, MD

## 2018-01-15 NOTE — Progress Notes (Signed)
   01/15/18 1300  Clinical Encounter Type  Visited With Patient and family together  Visit Type Follow-up (AD education/completion.)  Referral From Nurse  Spiritual Encounters  Spiritual Needs Emotional  Stress Factors  Patient Stress Factors Health changes  Advance Directives (For Healthcare)  Does Patient Have a Medical Advance Directive? Yes  Type of Advance Directive Living will;Healthcare Power of Attorney  Copy of Healthcare Power of Attorney in Chart? Yes  Copy of Living Will in Chart? Yes  Mental Health Advance Directives  Does Patient Have a Mental Health Advance Directive? Yes (Included in Living Will)   Chaplain provided AD education to patient and his son Clint. Patient reviewed AD and made selections. The patient's signature was witnessed and notarized. A paper copy was included in his chart.

## 2018-01-15 NOTE — Plan of Care (Signed)

## 2018-01-15 NOTE — Progress Notes (Signed)
Pt with pain 10/10 and not time for pain medication. Spoke with Dr. Sheryle Hail md to place order.

## 2018-01-15 NOTE — Care Management Note (Signed)
Case Management Note  Patient Details  Name: Alejandro Cook MRN: [id number] Date of Birth: [birth year]  Subjective/Objective:   Admitted to [facility] as admit the changed to observation. Lives alone. Son is Client [phone]). See Dr. Marvis Moeller July 30th, Prescriptions are filled per [facility]. No home health. No skilled facility. No home oxygen. Cane and rolling walker in the home. Larey Seat prior to this admission. Fair appetite. Takes care of all basic activities of daily living himeself, drives. Family will transport NPO Scheduled for surgery 01/16/18                  Action/Plan: Will continue to follow for discharge plans   Expected Discharge Date:  01/18/18               Expected Discharge Plan:     In-House Referral:   yes  Discharge planning Services   yes  Post Acute Care Choice:    Choice offered to:     DME Arranged:    DME Agency:     HH Arranged:    HH Agency:     Status of Service:     If discussed at [employer], dates discussed:    Additional Comments:  Gwenette Greet, RN MSN CCM Care management [phone] 01/15/2018, 2:41 PM

## 2018-01-16 ENCOUNTER — Observation Stay: Payer: Medicare PPO | Admitting: Anesthesiology

## 2018-01-16 ENCOUNTER — Ambulatory Visit: Admit: 2018-01-16 | Payer: Medicare PPO | Admitting: Orthopedic Surgery

## 2018-01-16 ENCOUNTER — Encounter
Admission: EM | Disposition: A | Discharge status: Home / Self Care | Payer: Self-pay | Source: Home / Self Care | Attending: Emergency Medicine

## 2018-01-16 ENCOUNTER — Encounter: Payer: Self-pay | Admitting: *Deleted

## 2018-01-16 ENCOUNTER — Observation Stay: Payer: Medicare PPO

## 2018-01-16 HISTORY — PX: ORIF TIBIA FRACTURE: SHX5416

## 2018-01-16 HISTORY — PX: CLOSED REDUCTION TIBIA: SHX5115

## 2018-01-16 LAB — HIV ANTIBODY (ROUTINE TESTING W REFLEX): HIV SCREEN 4TH GENERATION: NONREACTIVE

## 2018-01-16 SURGERY — CLOSED REDUCTION, TIBIA
Anesthesia: General | Laterality: Left

## 2018-01-16 MED ORDER — SUCCINYLCHOLINE CHLORIDE 20 MG/ML IJ SOLN
INTRAMUSCULAR | Status: DC | PRN
  Administered 2018-01-16: 100 mg via INTRAVENOUS

## 2018-01-16 MED ORDER — LISINOPRIL 20 MG PO TABS
20.0000 mg | ORAL_TABLET | Freq: Every day | ORAL | Status: DC
  Administered 2018-01-17 – 2018-01-18 (×2): 20 mg via ORAL
  Filled 2018-01-16 (×2): qty 1

## 2018-01-16 MED ORDER — FENTANYL CITRATE (PF) 100 MCG/2ML IJ SOLN
INTRAMUSCULAR | Status: AC
  Filled 2018-01-16: qty 2

## 2018-01-16 MED ORDER — KETOROLAC TROMETHAMINE 15 MG/ML IJ SOLN
7.5000 mg | Freq: Four times a day (QID) | INTRAMUSCULAR | Status: AC
  Administered 2018-01-16 – 2018-01-17 (×4): 7.5 mg via INTRAVENOUS
  Filled 2018-01-16 (×4): qty 1

## 2018-01-16 MED ORDER — LABETALOL HCL 5 MG/ML IV SOLN
INTRAVENOUS | Status: DC | PRN
  Administered 2018-01-16: 10 mg via INTRAVENOUS

## 2018-01-16 MED ORDER — PHENOL 1.4 % MT LIQD
1.0000 | OROMUCOSAL | Status: DC | PRN
  Filled 2018-01-16: qty 177

## 2018-01-16 MED ORDER — DEXAMETHASONE SODIUM PHOSPHATE 10 MG/ML IJ SOLN
INTRAMUSCULAR | Status: AC
  Filled 2018-01-16: qty 1

## 2018-01-16 MED ORDER — PHENYLEPHRINE HCL 10 MG/ML IJ SOLN
INTRAMUSCULAR | Status: AC
  Filled 2018-01-16: qty 1

## 2018-01-16 MED ORDER — ACETAMINOPHEN 10 MG/ML IV SOLN
INTRAVENOUS | Status: DC | PRN
  Administered 2018-01-16: 1000 mg via INTRAVENOUS

## 2018-01-16 MED ORDER — BUPIVACAINE HCL (PF) 0.25 % IJ SOLN
INTRAMUSCULAR | Status: AC
  Filled 2018-01-16: qty 30

## 2018-01-16 MED ORDER — PROPOFOL 10 MG/ML IV BOLUS
INTRAVENOUS | Status: AC
  Filled 2018-01-16: qty 20

## 2018-01-16 MED ORDER — OXYCODONE HCL 5 MG PO TABS
10.0000 mg | ORAL_TABLET | ORAL | Status: DC | PRN
  Administered 2018-01-17 – 2018-01-18 (×3): 10 mg via ORAL
  Filled 2018-01-16: qty 3

## 2018-01-16 MED ORDER — OXYCODONE HCL 5 MG PO TABS
5.0000 mg | ORAL_TABLET | Freq: Once | ORAL | Status: AC | PRN
  Administered 2018-01-16: 5 mg via ORAL

## 2018-01-16 MED ORDER — METHOCARBAMOL 500 MG PO TABS
500.0000 mg | ORAL_TABLET | Freq: Four times a day (QID) | ORAL | Status: DC | PRN
  Administered 2018-01-16 – 2018-01-18 (×4): 500 mg via ORAL
  Filled 2018-01-16 (×4): qty 1

## 2018-01-16 MED ORDER — HYDROCHLOROTHIAZIDE 12.5 MG PO CAPS
12.5000 mg | ORAL_CAPSULE | Freq: Every day | ORAL | Status: DC
  Administered 2018-01-17 – 2018-01-18 (×2): 12.5 mg via ORAL
  Filled 2018-01-16 (×2): qty 1

## 2018-01-16 MED ORDER — MAGNESIUM CITRATE PO SOLN
1.0000 | Freq: Once | ORAL | Status: DC | PRN
  Filled 2018-01-16: qty 296

## 2018-01-16 MED ORDER — CEFAZOLIN SODIUM-DEXTROSE 2-4 GM/100ML-% IV SOLN
INTRAVENOUS | Status: AC
  Filled 2018-01-16: qty 100

## 2018-01-16 MED ORDER — OXYCODONE HCL 5 MG PO TABS
ORAL_TABLET | ORAL | Status: AC
  Administered 2018-01-16: 15 mg
  Filled 2018-01-16: qty 1

## 2018-01-16 MED ORDER — FENTANYL CITRATE (PF) 100 MCG/2ML IJ SOLN
INTRAMUSCULAR | Status: DC | PRN
  Administered 2018-01-16 (×4): 50 ug via INTRAVENOUS

## 2018-01-16 MED ORDER — MENTHOL 3 MG MT LOZG
1.0000 | LOZENGE | OROMUCOSAL | Status: DC | PRN
  Filled 2018-01-16: qty 9

## 2018-01-16 MED ORDER — LIDOCAINE HCL (CARDIAC) PF 100 MG/5ML IV SOSY
PREFILLED_SYRINGE | INTRAVENOUS | Status: DC | PRN
  Administered 2018-01-16: 100 mg via INTRAVENOUS

## 2018-01-16 MED ORDER — FENTANYL CITRATE (PF) 100 MCG/2ML IJ SOLN
25.0000 ug | INTRAMUSCULAR | Status: DC | PRN
  Administered 2018-01-16: 25 ug via INTRAVENOUS

## 2018-01-16 MED ORDER — ACETAMINOPHEN 500 MG PO TABS
1000.0000 mg | ORAL_TABLET | Freq: Four times a day (QID) | ORAL | Status: AC
  Administered 2018-01-16 – 2018-01-17 (×4): 1000 mg via ORAL
  Filled 2018-01-16 (×4): qty 2

## 2018-01-16 MED ORDER — POLYETHYLENE GLYCOL 3350 17 G PO PACK
17.0000 g | PACK | Freq: Every day | ORAL | Status: DC | PRN
  Administered 2018-01-17: 17 g via ORAL
  Filled 2018-01-16: qty 1

## 2018-01-16 MED ORDER — HYDROMORPHONE HCL 1 MG/ML IJ SOLN: 0.5000 mg | INTRAMUSCULAR | Status: DC | PRN

## 2018-01-16 MED ORDER — SUGAMMADEX SODIUM 200 MG/2ML IV SOLN
INTRAVENOUS | Status: AC
  Filled 2018-01-16: qty 2

## 2018-01-16 MED ORDER — ROCURONIUM BROMIDE 100 MG/10ML IV SOLN
INTRAVENOUS | Status: DC | PRN
  Administered 2018-01-16 (×2): 10 mg via INTRAVENOUS
  Administered 2018-01-16: 30 mg via INTRAVENOUS
  Administered 2018-01-16 (×3): 10 mg via INTRAVENOUS

## 2018-01-16 MED ORDER — PHENYLEPHRINE HCL 10 MG/ML IJ SOLN
INTRAMUSCULAR | Status: DC | PRN
  Administered 2018-01-16 (×6): 100 ug via INTRAVENOUS

## 2018-01-16 MED ORDER — DOCUSATE SODIUM 100 MG PO CAPS
100.0000 mg | ORAL_CAPSULE | Freq: Two times a day (BID) | ORAL | Status: DC
  Administered 2018-01-16 – 2018-01-18 (×4): 100 mg via ORAL
  Filled 2018-01-16 (×4): qty 1

## 2018-01-16 MED ORDER — OXYCODONE HCL 5 MG PO TABS
5.0000 mg | ORAL_TABLET | ORAL | Status: DC | PRN
  Administered 2018-01-16: 5 mg via ORAL
  Filled 2018-01-16: qty 2
  Filled 2018-01-16: qty 1
  Filled 2018-01-16 (×2): qty 2

## 2018-01-16 MED ORDER — ACETAMINOPHEN 325 MG PO TABS: 325.0000 mg | ORAL_TABLET | Freq: Four times a day (QID) | ORAL | Status: DC | PRN

## 2018-01-16 MED ORDER — PROPOFOL 10 MG/ML IV BOLUS
INTRAVENOUS | Status: DC | PRN
  Administered 2018-01-16: 150 mg via INTRAVENOUS

## 2018-01-16 MED ORDER — ENOXAPARIN SODIUM 40 MG/0.4ML ~~LOC~~ SOLN
40.0000 mg | SUBCUTANEOUS | Status: DC
  Administered 2018-01-17 – 2018-01-18 (×2): 40 mg via SUBCUTANEOUS
  Filled 2018-01-16 (×2): qty 0.4

## 2018-01-16 MED ORDER — BISACODYL 10 MG RE SUPP: 10.0000 mg | Freq: Every day | RECTAL | Status: DC | PRN

## 2018-01-16 MED ORDER — SODIUM CHLORIDE 0.9 % IV SOLN
INTRAVENOUS | Status: DC | PRN
  Administered 2018-01-16: 10 ug/min via INTRAVENOUS

## 2018-01-16 MED ORDER — BUPIVACAINE HCL (PF) 0.25 % IJ SOLN
INTRAMUSCULAR | Status: DC | PRN
  Administered 2018-01-16: 30 mL

## 2018-01-16 MED ORDER — LIDOCAINE HCL (PF) 2 % IJ SOLN
INTRAMUSCULAR | Status: AC
  Filled 2018-01-16: qty 10

## 2018-01-16 MED ORDER — ONDANSETRON HCL 4 MG PO TABS: 4.0000 mg | ORAL_TABLET | Freq: Four times a day (QID) | ORAL | Status: DC | PRN

## 2018-01-16 MED ORDER — SUCCINYLCHOLINE CHLORIDE 20 MG/ML IJ SOLN
INTRAMUSCULAR | Status: AC
  Filled 2018-01-16: qty 1

## 2018-01-16 MED ORDER — ROCURONIUM BROMIDE 50 MG/5ML IV SOLN
INTRAVENOUS | Status: AC
  Filled 2018-01-16: qty 2

## 2018-01-16 MED ORDER — OXYCODONE HCL 5 MG/5ML PO SOLN: 5.0000 mg | Freq: Once | ORAL | Status: AC | PRN

## 2018-01-16 MED ORDER — CEFAZOLIN SODIUM-DEXTROSE 2-4 GM/100ML-% IV SOLN
2.0000 g | Freq: Four times a day (QID) | INTRAVENOUS | Status: AC
  Administered 2018-01-16 (×2): 2 g via INTRAVENOUS
  Filled 2018-01-16 (×2): qty 100

## 2018-01-16 MED ORDER — SUGAMMADEX SODIUM 200 MG/2ML IV SOLN
INTRAVENOUS | Status: DC | PRN
  Administered 2018-01-16: 200 mg via INTRAVENOUS

## 2018-01-16 MED ORDER — ALUM & MAG HYDROXIDE-SIMETH 200-200-20 MG/5ML PO SUSP: 30.0000 mL | ORAL | Status: DC | PRN

## 2018-01-16 MED ORDER — ONDANSETRON HCL 4 MG/2ML IJ SOLN: 4.0000 mg | Freq: Four times a day (QID) | INTRAMUSCULAR | Status: DC | PRN

## 2018-01-16 MED ORDER — GABAPENTIN 300 MG PO CAPS
300.0000 mg | ORAL_CAPSULE | Freq: Three times a day (TID) | ORAL | Status: DC
  Administered 2018-01-16 – 2018-01-18 (×6): 300 mg via ORAL
  Filled 2018-01-16 (×6): qty 1

## 2018-01-16 MED ORDER — DEXAMETHASONE SODIUM PHOSPHATE 10 MG/ML IJ SOLN
INTRAMUSCULAR | Status: DC | PRN
  Administered 2018-01-16: 10 mg via INTRAVENOUS

## 2018-01-16 MED ORDER — ACETAMINOPHEN 10 MG/ML IV SOLN
INTRAVENOUS | Status: AC
  Filled 2018-01-16: qty 100

## 2018-01-16 MED ORDER — SENNOSIDES-DOCUSATE SODIUM 8.6-50 MG PO TABS
1.0000 | ORAL_TABLET | Freq: Every day | ORAL | Status: DC
  Administered 2018-01-16 – 2018-01-17 (×2): 1 via ORAL
  Filled 2018-01-16 (×2): qty 1

## 2018-01-16 MED ORDER — FENTANYL CITRATE (PF) 100 MCG/2ML IJ SOLN
INTRAMUSCULAR | Status: AC
  Administered 2018-01-16: 25 ug via INTRAVENOUS
  Filled 2018-01-16: qty 2

## 2018-01-16 MED ORDER — METHOCARBAMOL 1000 MG/10ML IJ SOLN
500.0000 mg | Freq: Four times a day (QID) | INTRAVENOUS | Status: DC | PRN
  Filled 2018-01-16: qty 5

## 2018-01-16 MED ORDER — MIDAZOLAM HCL 2 MG/2ML IJ SOLN
INTRAMUSCULAR | Status: AC
  Filled 2018-01-16: qty 2

## 2018-01-16 MED ORDER — ONDANSETRON HCL 4 MG/2ML IJ SOLN
INTRAMUSCULAR | Status: AC
  Filled 2018-01-16: qty 2

## 2018-01-16 MED ORDER — POTASSIUM CHLORIDE IN NACL 20-0.9 MEQ/L-% IV SOLN
INTRAVENOUS | Status: DC
  Administered 2018-01-16 – 2018-01-17 (×2): via INTRAVENOUS
  Filled 2018-01-16 (×6): qty 1000

## 2018-01-16 MED ORDER — NEOMYCIN-POLYMYXIN B GU 40-200000 IR SOLN
Status: DC | PRN
  Administered 2018-01-16: 2 mL

## 2018-01-16 MED ORDER — ONDANSETRON HCL 4 MG/2ML IJ SOLN
INTRAMUSCULAR | Status: DC | PRN
  Administered 2018-01-16: 4 mg via INTRAVENOUS

## 2018-01-16 MED ORDER — SODIUM CHLORIDE 0.9 % IV SOLN
INTRAVENOUS | Status: DC | PRN
  Administered 2018-01-16 (×2): via INTRAVENOUS

## 2018-01-16 MED ORDER — TRAMADOL HCL 50 MG PO TABS
50.0000 mg | ORAL_TABLET | Freq: Four times a day (QID) | ORAL | Status: DC
  Administered 2018-01-16 – 2018-01-18 (×7): 50 mg via ORAL
  Filled 2018-01-16 (×7): qty 1

## 2018-01-16 MED ORDER — MIDAZOLAM HCL 2 MG/2ML IJ SOLN
INTRAMUSCULAR | Status: DC | PRN
  Administered 2018-01-16: 2 mg via INTRAVENOUS

## 2018-01-16 SURGICAL SUPPLY — 85 items
BIT DRILL 100X2.5XANTM LCK (BIT) ×1 IMPLANT
BIT DRILL CAL (BIT) ×1 IMPLANT
BIT DRILL SLEEVE MEASURING (BIT) ×1 IMPLANT
BIT DRL 100X2.5XANTM LCK (BIT) ×1
BNDG ESMARK 6X12 TAN STRL LF (GAUZE/BANDAGES/DRESSINGS) ×3 IMPLANT
CANISTER SUCT 1200ML W/VALVE (MISCELLANEOUS) ×3 IMPLANT
CUFF TOURN 24 STER (MISCELLANEOUS) IMPLANT
CUFF TOURN 30 STER DUAL PORT (MISCELLANEOUS) ×3 IMPLANT
DRAPE C-ARM XRAY 36X54 (DRAPES) ×3 IMPLANT
DRAPE C-ARMOR (DRAPES) ×3 IMPLANT
DRAPE INCISE IOBAN 66X45 STRL (DRAPES) ×3 IMPLANT
DRAPE INCISE IOBAN 66X60 STRL (DRAPES) ×3 IMPLANT
DRAPE SHEET LG 3/4 BI-LAMINATE (DRAPES) ×3 IMPLANT
DRILL BIT 2.5MM (BIT) ×2
DRILL BIT CAL (BIT) ×3
DRILL SLEEVE MEASURING (BIT) ×3
DRSG EMULSION OIL 3X8 NADH (GAUZE/BANDAGES/DRESSINGS) ×3 IMPLANT
DURAPREP 26ML APPLICATOR (WOUND CARE) ×9 IMPLANT
ELECT CAUTERY BLADE 6.4 (BLADE) ×3 IMPLANT
ELECT REM PT RETURN 9FT ADLT (ELECTROSURGICAL) ×3
ELECTRODE REM PT RTRN 9FT ADLT (ELECTROSURGICAL) ×1 IMPLANT
GAUZE 4X4 16PLY RFD (DISPOSABLE) ×3 IMPLANT
GAUZE PETRO XEROFOAM 1X8 (MISCELLANEOUS) ×3 IMPLANT
GAUZE SPONGE 4X4 12PLY STRL (GAUZE/BANDAGES/DRESSINGS) ×3 IMPLANT
GLOVE BIOGEL PI IND STRL 9 (GLOVE) ×1 IMPLANT
GLOVE BIOGEL PI INDICATOR 9 (GLOVE) ×2
GLOVE SURG 9.0 ORTHO LTXF (GLOVE) ×12 IMPLANT
GLOVE SURG LX XRAY STRL SZ9 (GLOVE) ×3 IMPLANT
GOWN STRL REUS W/ TWL LRG LVL3 (GOWN DISPOSABLE) ×1 IMPLANT
GOWN STRL REUS W/ TWL XL LVL3 (GOWN DISPOSABLE) ×1 IMPLANT
GOWN STRL REUS W/TWL 2XL LVL3 (GOWN DISPOSABLE) ×3 IMPLANT
GOWN STRL REUS W/TWL LRG LVL3 (GOWN DISPOSABLE) ×2
GOWN STRL REUS W/TWL XL LVL3 (GOWN DISPOSABLE) ×2
GOWN STRL REUS W/TWL XL LVL4 (GOWN DISPOSABLE) ×3 IMPLANT
HANDLE YANKAUER SUCT BULB TIP (MISCELLANEOUS) ×3 IMPLANT
HEMOVAC 400CC 10FR (MISCELLANEOUS) IMPLANT
IMMOB KNEE 24 THIGH 24 443303 (SOFTGOODS) ×3 IMPLANT
K-WIRE ACE 1.6X6 (WIRE) ×15
KIT TURNOVER KIT A (KITS) ×3 IMPLANT
KWIRE ACE 1.6X6 (WIRE) ×5 IMPLANT
NEEDLE FILTER BLUNT 18X 1/2SAF (NEEDLE) ×2
NEEDLE FILTER BLUNT 18X1 1/2 (NEEDLE) ×1 IMPLANT
NEEDLE HYPO 22GX1.5 SAFETY (NEEDLE) ×3 IMPLANT
NS IRRIG 1000ML POUR BTL (IV SOLUTION) ×6 IMPLANT
PACK TOTAL KNEE (MISCELLANEOUS) ×3 IMPLANT
PAD ABD DERMACEA PRESS 5X9 (GAUZE/BANDAGES/DRESSINGS) ×3 IMPLANT
PAD CAST CTTN 4X4 STRL (SOFTGOODS) ×2 IMPLANT
PAD PREP 24X41 OB/GYN DISP (PERSONAL CARE ITEMS) ×3 IMPLANT
PAD WRAPON POLAR KNEE (MISCELLANEOUS) ×1 IMPLANT
PADDING CAST COTTON 4X4 STRL (SOFTGOODS) ×4
PLATE LOCK LG 7H LT PROX TIB (Plate) ×3 IMPLANT
SCREW CORT FT 32X3.5XNONLOCK (Screw) ×1 IMPLANT
SCREW CORTICAL 3.5MM  28MM (Screw) ×2 IMPLANT
SCREW CORTICAL 3.5MM  30MM (Screw) ×2 IMPLANT
SCREW CORTICAL 3.5MM  32MM (Screw) ×2 IMPLANT
SCREW CORTICAL 3.5MM  34MM (Screw) ×4 IMPLANT
SCREW CORTICAL 3.5MM 28MM (Screw) ×1 IMPLANT
SCREW CORTICAL 3.5MM 30MM (Screw) ×1 IMPLANT
SCREW CORTICAL 3.5MM 34MM (Screw) ×2 IMPLANT
SCREW LOCK CORT STAR 3.5X26 (Screw) ×6 IMPLANT
SCREW LOCK CORT STAR 3.5X30 (Screw) ×6 IMPLANT
SCREW LOCK CORT STAR 3.5X50 (Screw) ×3 IMPLANT
SCREW LOCK CORT STAR 3.5X60 (Screw) ×3 IMPLANT
SCREW LOCK CORT STAR 3.5X65 (Screw) ×6 IMPLANT
SCREW LOCK CORT STAR 3.5X70 (Screw) ×3 IMPLANT
SCREW LOCK CORT STAR 3.5X75 (Screw) ×9 IMPLANT
SCREW LP 3.5X75MM (Screw) ×3 IMPLANT
SCREW LP 3.5X85MM (Screw) ×6 IMPLANT
SPLINT CAST 1 STEP 4X30 (MISCELLANEOUS) ×6 IMPLANT
SPONGE LAP 18X18 RF (DISPOSABLE) ×3 IMPLANT
STAPLER SKIN PROX 35W (STAPLE) ×3 IMPLANT
STOCKINETTE BIAS CUT 6 980064 (GAUZE/BANDAGES/DRESSINGS) ×3 IMPLANT
STRAP SAFETY 5IN WIDE (MISCELLANEOUS) ×3 IMPLANT
SUT ETHILON 2 0 FSLX (SUTURE) ×3 IMPLANT
SUT VIC AB 0 CT2 27 (SUTURE) ×6 IMPLANT
SUT VIC AB 2-0 CT1 27 (SUTURE) ×4
SUT VIC AB 2-0 CT1 TAPERPNT 27 (SUTURE) ×2 IMPLANT
SUT VIC AB 2-0 SH 27 (SUTURE) ×2
SUT VIC AB 2-0 SH 27XBRD (SUTURE) ×1 IMPLANT
SUT VICRYL 1-0 27IN ABS (SUTURE) ×3
SUTURE VICRYL 1-0 27IN ABS (SUTURE) ×1 IMPLANT
SYR 20CC LL (SYRINGE) ×3 IMPLANT
SYR 5ML LL (SYRINGE) ×3 IMPLANT
TRAY FOLEY MTR SLVR 16FR STAT (SET/KITS/TRAYS/PACK) ×3 IMPLANT
WRAPON POLAR PAD KNEE (MISCELLANEOUS) ×3

## 2018-01-16 NOTE — Progress Notes (Signed)
Subjective:  POST-OP CHECK:  Patient reports left leg pain as mild to moderate.  No other complaints.  Objective:   VITALS:   Vitals:   01/16/18 1358 01/16/18 1408 01/16/18 1452 01/16/18 1606  BP: 138/83 125/78 128/85 128/88  Pulse: 84 81 88 87  Resp: (!) 22 16 18 18   Temp:  98.8 F (37.1 C) 98 F (36.7 C) 98.2 F (36.8 C)  TempSrc:   Oral Oral  SpO2: 95% 95% 93% 98%  Weight:      Height:        PHYSICAL EXAM: Left lower extremity: Neurovascular intact Sensation intact distally Intact pulses distally Dorsiflexion/Plantar flexion intact Incision: dressing C/D/I Compartment soft  LABS  No results found for this or any previous visit (from the past 24 hour(s)).  Dg Tibia/fibula Left  Result Date: 01/16/2018 CLINICAL DATA:  ORIF tibial fracture. EXAM: LEFT TIBIA AND FIBULA - 2 VIEW COMPARISON:  01/14/2018 radiographs FINDINGS: Intraoperative spot views of the proximal tibia are submitted postoperatively for interpretation. Internal plate and screw fixation noted traversing a proximal tibial fracture, in near-anatomic alignment and position. No definite hardware complicating features noted. A minimally displaced fibular fracture is unchanged. IMPRESSION: ORIF proximal tibial fracture, in near-anatomic alignment and position. Electronically Signed   By: Harmon Pier M.D.   On: 01/16/2018 12:45   Ct Knee Left Wo Contrast  Result Date: 01/14/2018 CLINICAL DATA:  Tibial and fibular fractures. EXAM: CT OF THE LEFT KNEE WITHOUT CONTRAST TECHNIQUE: Multidetector CT imaging of the LEFT knee was performed according to the standard protocol. Multiplanar CT image reconstructions were also generated. COMPARISON:  None. FINDINGS: Bones/Joint/Cartilage Fine bony detail is slightly limited due to marked osteopenia about the knee. No joint effusion is identified. The fracture involving the proximal tibial metaphysis as seen on the same day radiographs appears to be extra-articular extending to  the level of the tibial tuberosity where there is partial avulsion of the tibial tuberosity. No patella alta is identified as the tibial tuberosity still maintains its relationship with the proximal tibia. Slight volar angulation of the distal main fracture fragment of the tibia is identified with probable slight anterior impaction. A proximal diaphyseal oblique fibular fracture with slight anterior and medial displacement of the distal fracture fragment is identified with roughly 1/4 shaft width. Ligaments Suboptimally assessed by CT. Muscles and Tendons Intact appearing extensor mechanism tendons. No intramuscular hemorrhage or mass. Soft tissues Mild subcutaneous soft tissue edema about the fracture. Atherosclerotic calcifications of the included popliteal and proximal tibial arteries. IMPRESSION: Osteopenic appearance of the left knee limiting fine bony detail. 1. The fracture involving the proximal tibial metaphysis extends to the level of the tibial tuberosity where there is partial avulsion noted. The fracture does not appear to extend into the knee joint as initially suspected radiographically. This is further supported by lack of a joint effusion. Slight anterior impaction of the tibial diaphysis upon the metaphysis is suggested on the sagittal images. Slight volar angulation is also noted of the tibial diaphysis. 2. Acute proximal fibular diaphyseal fracture with slight anterior and medial displacement of the main fracture fragment. Electronically Signed   By: Tollie Eth M.D.   On: 01/14/2018 19:55   Dg Knee Left Port  Result Date: 01/16/2018 CLINICAL DATA:  ORIF proximal tibial fracture. EXAM: PORTABLE LEFT KNEE - 1-2 VIEW COMPARISON:  Prior studies FINDINGS: Internal plate screw fixation noted traversing a proximal tibial fracture, in near-anatomic alignment and position. A mildly displaced proximal fibular fracture is identified. No  complicating features are identified. IMPRESSION: ORIF proximal  tibial fracture, in near-anatomic alignment and position. Electronically Signed   By: Harmon Pier M.D.   On: 01/16/2018 14:16   Dg C-arm 1-60 Min-no Report  Result Date: 01/16/2018 Fluoroscopy was utilized by the requesting physician.  No radiographic interpretation.    Assessment/Plan: Day of Surgery   Active Problems:   Tibia fracture  Patient doing well postop.  Continue 24 hours of postop antibiotics.  Continue knee immobilizer at all times.  Continue Polar Care and elevation of the left lower extremity to reduce swelling.  PT to in the morning.  Patient will begin Lovenox tomorrow.  Reviewed the postop x-ray which shows the fracture is well reduced.  The hardware is well-positioned and shows no evidence of failure.  There are no postop complication seen on x-ray.    Juanell Fairly , MD 01/16/2018, 5:51 PM

## 2018-01-16 NOTE — Progress Notes (Signed)
Subjective:  Patient seen in pre-op area.  Patient reports left knee/leg pain as mild.    Objective:   VITALS:   Vitals:   01/15/18 0822 01/15/18 1640 01/15/18 2352 01/16/18 0801  BP: 129/80 125/76 129/85 131/71  Pulse: 75 70 74 78  Resp: 18 18 19 18   Temp: 98.2 F (36.8 C) 98 F (36.7 C) 97.9 F (36.6 C) 98.8 F (37.1 C)  TempSrc: Oral Oral Oral Tympanic  SpO2: 99% 96% 97% 96%  Weight:    90.8 kg  Height:    5\' 11"  (1.803 m)    PHYSICAL EXAM: Left knee: Neurovascular intact Sensation intact distally Intact pulses distally Dorsiflexion/Plantar flexion intact No cellulitis present Compartment soft  LABS  Results for orders placed or performed during the hospital encounter of 01/14/18 (from the past 24 hour(s))  Surgical PCR screen     Status: None   Collection Time: 01/15/18  2:34 PM  Result Value Ref Range   MRSA, PCR NEGATIVE NEGATIVE   Staphylococcus aureus NEGATIVE NEGATIVE    Dg Pelvis 1-2 Views  Result Date: 01/14/2018 CLINICAL DATA:  Pelvic pain since a shelf fell on the patient today. EXAM: PELVIS - 1-2 VIEW COMPARISON:  None. FINDINGS: No acute bony or joint abnormality is identified. Severe left hip osteoarthritis is seen. Extensive atherosclerosis is noted. Colon projects far below the pubic bone suggestive of a large ventral hernia. IMPRESSION: Negative for fracture. Severe left hip osteoarthritis. Findings suggestive of a large ventral hernia. Electronically Signed   By: Drusilla Kanner M.D.   On: 01/14/2018 16:07   Dg Tibia/fibula Left  Result Date: 01/14/2018 CLINICAL DATA:  Onset left lower leg pain when a shelf fell on the patient today. Initial encounter. EXAM: LEFT TIBIA AND FIBULA - 2 VIEW COMPARISON:  None. FINDINGS: Nondisplaced fracture of the proximal diaphysis of the fibula is identified. No other fracture is seen. The patient has severe subtalar osteoarthritis. Extensive atherosclerosis is noted. IMPRESSION: Nondisplaced fracture proximal  diaphysis of the fibula. If the patient has knee pain, dedicated plain films of the knee are recommended for further evaluation as the knee is incompletely evaluated on this exam. Severe subtalar arthritis. Atherosclerosis. Electronically Signed   By: Drusilla Kanner M.D.   On: 01/14/2018 16:06   Ct Knee Left Wo Contrast  Result Date: 01/14/2018 CLINICAL DATA:  Tibial and fibular fractures. EXAM: CT OF THE LEFT KNEE WITHOUT CONTRAST TECHNIQUE: Multidetector CT imaging of the LEFT knee was performed according to the standard protocol. Multiplanar CT image reconstructions were also generated. COMPARISON:  None. FINDINGS: Bones/Joint/Cartilage Fine bony detail is slightly limited due to marked osteopenia about the knee. No joint effusion is identified. The fracture involving the proximal tibial metaphysis as seen on the same day radiographs appears to be extra-articular extending to the level of the tibial tuberosity where there is partial avulsion of the tibial tuberosity. No patella alta is identified as the tibial tuberosity still maintains its relationship with the proximal tibia. Slight volar angulation of the distal main fracture fragment of the tibia is identified with probable slight anterior impaction. A proximal diaphyseal oblique fibular fracture with slight anterior and medial displacement of the distal fracture fragment is identified with roughly 1/4 shaft width. Ligaments Suboptimally assessed by CT. Muscles and Tendons Intact appearing extensor mechanism tendons. No intramuscular hemorrhage or mass. Soft tissues Mild subcutaneous soft tissue edema about the fracture. Atherosclerotic calcifications of the included popliteal and proximal tibial arteries. IMPRESSION: Osteopenic appearance of the left knee limiting  fine bony detail. 1. The fracture involving the proximal tibial metaphysis extends to the level of the tibial tuberosity where there is partial avulsion noted. The fracture does not appear to  extend into the knee joint as initially suspected radiographically. This is further supported by lack of a joint effusion. Slight anterior impaction of the tibial diaphysis upon the metaphysis is suggested on the sagittal images. Slight volar angulation is also noted of the tibial diaphysis. 2. Acute proximal fibular diaphyseal fracture with slight anterior and medial displacement of the main fracture fragment. Electronically Signed   By: Tollie Eth M.D.   On: 01/14/2018 19:55   Dg Knee Complete 4 Views Left  Result Date: 01/14/2018 CLINICAL DATA:  Fibular fracture EXAM: LEFT KNEE - COMPLETE 4+ VIEW COMPARISON:  LEFT tibial and fibular radiographs of 01/14/2018 FINDINGS: Diffuse osseous demineralization. Nondisplaced transverse fracture of the proximal LEFT fibular diaphysis with minimal apex medial angulation. Narrowing of knee joint spaces. Femur and patella appear grossly intact. Mildly displaced fracture of the proximal tibial metaphysis, extent poorly delineated due to degree of demineralization but at least involving the lateral plateau and suspect transversely at the metaphysis. Extensive atherosclerotic calcifications and regional soft tissue swelling. IMPRESSION: Transverse displaced fracture of the proximal LEFT fibular diaphysis. Probable transverse metaphyseal fracture of the proximal LEFT tibia at with extension into the lateral plateau, poorly delineated due to degree of demineralization; CT of the LEFT knee recommended to characterize. Electronically Signed   By: Ulyses Southward M.D.   On: 01/14/2018 17:55   Dg Femur Min 2 Views Left  Result Date: 01/14/2018 CLINICAL DATA:  Diffuse left leg pain due to an injury today when a shelf fell on the patient. Initial encounter. EXAM: LEFT FEMUR 2 VIEWS COMPARISON:  None. FINDINGS: The patient has severe left hip osteoarthritis with bone-on-bone joint space narrowing and extensive subchondral sclerosis. No femur fracture is identified but the patient has  a fracture of the proximal diaphysis of the fibula. Extensive atherosclerosis is noted. Colon projects below the pubic bones. IMPRESSION: Intact left femur. Partial visualization of a left fibular fracture. Severe osteoarthritis left hip. Extensive atherosclerosis. Findings highly suggestive of a large ventral hernia. Electronically Signed   By: Drusilla Kanner M.D.   On: 01/14/2018 16:05    Assessment/Plan: Day of Surgery   Active Problems:   Tibia fracture  Reviewed the surgical plan with the patient.  Closed reduction will be performed and xrays taken.  If fracture well aligned, long leg cast will be applied as patient would prefer not to have open surgery.  If the fracture is not well aligned after surgery, ORIF will be performed.  Patient understands and agrees with this plan.    Juanell Fairly , MD 01/16/2018, 9:07 AM

## 2018-01-16 NOTE — Anesthesia Procedure Notes (Signed)
Procedure Name: Intubation Date/Time: 01/16/2018 9:21 AM Performed by: Irving Burton, CRNA Pre-anesthesia Checklist: Patient identified, Emergency Drugs available, Suction available and Patient being monitored Patient Re-evaluated:Patient Re-evaluated prior to induction Oxygen Delivery Method: Circle system utilized Preoxygenation: Pre-oxygenation with 100% oxygen Induction Type: IV induction Ventilation: Two handed mask ventilation required Laryngoscope Size: McGraph and 4 Grade View: Grade I Tube type: Oral Tube size: 7.5 mm Number of attempts: 1 Airway Equipment and Method: Stylet and Video-laryngoscopy Placement Confirmation: ETT inserted through vocal cords under direct vision,  positive ETCO2 and breath sounds checked- equal and bilateral Secured at: 21 cm Tube secured with: Tape Dental Injury: Teeth and Oropharynx as per pre-operative assessment  Difficulty Due To: Difficulty was anticipated and Difficult Airway- due to dentition

## 2018-01-16 NOTE — Transfer of Care (Signed)
Immediate Anesthesia Transfer of Care Note  Patient: LLEWYN HEAP  Procedure(s) Performed: CLOSED REDUCTION TIBIA VS. ORIF TIBIA (Left ) OPEN REDUCTION INTERNAL FIXATION (ORIF) TIBIA FRACTURE (Left )  Patient Location: PACU  Anesthesia Type:General  Level of Consciousness: awake  Airway & Oxygen Therapy: Patient connected to face mask oxygen  Post-op Assessment: Post -op Vital signs reviewed and stable  Post vital signs: stable  Last Vitals:  Vitals Value Taken Time  BP 147/95 01/16/2018  1:13 PM  Temp 36.6 C 01/16/2018  1:13 PM  Pulse 96 01/16/2018  1:13 PM  Resp 22 01/16/2018  1:13 PM  SpO2 98 % 01/16/2018  1:13 PM  Vitals shown include unvalidated device data.  Last Pain:  Vitals:   01/16/18 1313  TempSrc: Temporal  PainSc:       Patients Stated Pain Goal: 3 (01/16/18 0801)  Complications: No apparent anesthesia complications

## 2018-01-16 NOTE — Anesthesia Post-op Follow-up Note (Signed)
Anesthesia QCDR form completed.        

## 2018-01-16 NOTE — Anesthesia Preprocedure Evaluation (Addendum)
Anesthesia Evaluation  Patient identified by MRN, date of birth, ID band Patient awake    Reviewed: Allergy & Precautions, H&P , NPO status , Patient's Chart, lab work & pertinent test results  Airway Mallampati: III  TM Distance: >3 FB Neck ROM: full   Comment: beard Dental  (+) Missing, Chipped, Poor Dentition   Pulmonary Current Smoker,     + wheezing (soft scattered wheeze)      Cardiovascular Exercise Tolerance: Poor hypertension, + Past MI (pt was told he had a prior MI after an EKG, no cardiac interventions)   Rhythm:regular Rate:Normal  Activity limited 2/2 arthritis, uses walker to ambulate   Neuro/Psych PSYCHIATRIC DISORDERS Depression negative neurological ROS  negative psych ROS   GI/Hepatic negative GI ROS, Neg liver ROS,   Endo/Other  negative endocrine ROS  Renal/GU      Musculoskeletal   Abdominal   Peds  Hematology negative hematology ROS (+)   Anesthesia Other Findings Past Medical History: No date: Depression No date: Hyperlipidemia No date: Hypertension  History reviewed. No pertinent surgical history.  BMI    Body Mass Index:  27.91 kg/m      Reproductive/Obstetrics negative OB ROS                           Anesthesia Physical Anesthesia Plan  ASA: III  Anesthesia Plan: General ETT   Post-op Pain Management:    Induction:   PONV Risk Score and Plan: Ondansetron and Dexamethasone  Airway Management Planned:   Additional Equipment:   Intra-op Plan:   Post-operative Plan:   Informed Consent: I have reviewed the patients History and Physical, chart, labs and discussed the procedure including the risks, benefits and alternatives for the proposed anesthesia with the patient or authorized representative who has indicated his/her understanding and acceptance.   Dental Advisory Given  Plan Discussed with: Anesthesiologist, CRNA and Surgeon  Anesthesia  Plan Comments:        Anesthesia Quick Evaluation

## 2018-01-16 NOTE — Progress Notes (Signed)
[facility] - [place] [facility]      PATIENT NAME: Alejandro Cook    MR#:  [id number]  DATE OF BIRTH:  [birth year]  SUBJECTIVE:   Patient is status post open reduction internal fixation of the left fibular and tibial fracture today.  Patient seen postoperatively and denies any complaints presently.  REVIEW OF SYSTEMS:    Review of Systems  Constitutional: Negative for chills and fever.  HENT: Negative for congestion and tinnitus.   Eyes: Negative for blurred vision and double vision.  Respiratory: Negative for cough, shortness of breath and wheezing.   Cardiovascular: Negative for chest pain, orthopnea and PND.  Gastrointestinal: Negative for abdominal pain, diarrhea, nausea and vomiting.  Genitourinary: Negative for dysuria and hematuria.  Musculoskeletal: Positive for falls and joint pain (Left leg pain).  Neurological: Negative for dizziness, sensory change and focal weakness.  All other systems reviewed and are negative.   Nutrition: Regular  Tolerating Diet: Yes Tolerating PT: Await Eval.   DRUG ALLERGIES:  No Known Allergies  VITALS:  Blood pressure 128/85, pulse 88, temperature 98 F (36.7 C), temperature source Oral, resp. rate 18, height 5\' 11"  (1.803 m), weight 90.8 kg, SpO2 93 %.  PHYSICAL EXAMINATION:   Physical Exam  GENERAL:  67 y.o.-year-old patient lying in bed in no acute distress.  EYES: Pupils equal, round, reactive to light and accommodation. No scleral icterus. Extraocular muscles intact.  HEENT: Head atraumatic, normocephalic. Oropharynx and nasopharynx clear.  NECK:  Supple, no jugular venous distention. No thyroid enlargement, no tenderness.  LUNGS: Normal breath sounds bilaterally, no wheezing, rales, rhonchi. No use of accessory muscles of respiration.  CARDIOVASCULAR: S1, S2 normal. No murmurs, rubs, or gallops.  ABDOMEN: Soft, nontender, nondistended. Bowel sounds present. No organomegaly or mass.  EXTREMITIES: No  cyanosis, clubbing or edema b/l. Left leg s/p ORIF and in Immobilizer NEUROLOGIC: Cranial nerves II through XII are intact. No focal Motor or sensory deficits b/l.  PSYCHIATRIC: The patient is alert and oriented x 3.  SKIN: No obvious rash, lesion, or ulcer.   Foley Cath in place with yellow urine draining.    LABORATORY PANEL:   CBC Recent Labs  Lab 01/15/18 0317  WBC 11.5*  HGB 12.2*  HCT 36.4*  PLT 303   ------------------------------------------------------------------------------------------------------------------  Chemistries  Recent Labs  Lab 01/15/18 0317  NA 140  K 3.9  CL 106  CO2 29  GLUCOSE 115*  BUN 22  CREATININE 1.06  CALCIUM 8.7*   ------------------------------------------------------------------------------------------------------------------  Cardiac Enzymes No results for input(s): TROPONINI in the last 168 hours. ------------------------------------------------------------------------------------------------------------------  RADIOLOGY:  Dg Tibia/fibula Left  Result Date: 01/16/2018 CLINICAL DATA:  ORIF tibial fracture. EXAM: LEFT TIBIA AND FIBULA - 2 VIEW COMPARISON:  01/14/2018 radiographs FINDINGS: Intraoperative spot views of the proximal tibia are submitted postoperatively for interpretation. Internal plate and screw fixation noted traversing a proximal tibial fracture, in near-anatomic alignment and position. No definite hardware complicating features noted. A minimally displaced fibular fracture is unchanged. IMPRESSION: ORIF proximal tibial fracture, in near-anatomic alignment and position. Electronically Signed   By: Harmon Pier M.D.   On: 01/16/2018 12:45   Ct Knee Left Wo Contrast  Result Date: 01/14/2018 CLINICAL DATA:  Tibial and fibular fractures. EXAM: CT OF THE LEFT KNEE WITHOUT CONTRAST TECHNIQUE: Multidetector CT imaging of the LEFT knee was performed according to the standard protocol. Multiplanar CT image reconstructions were  also generated. COMPARISON:  None. FINDINGS: Bones/Joint/Cartilage Fine bony detail is slightly limited due to marked  osteopenia about the knee. No joint effusion is identified. The fracture involving the proximal tibial metaphysis as seen on the same day radiographs appears to be extra-articular extending to the level of the tibial tuberosity where there is partial avulsion of the tibial tuberosity. No patella alta is identified as the tibial tuberosity still maintains its relationship with the proximal tibia. Slight volar angulation of the distal main fracture fragment of the tibia is identified with probable slight anterior impaction. A proximal diaphyseal oblique fibular fracture with slight anterior and medial displacement of the distal fracture fragment is identified with roughly 1/4 shaft width. Ligaments Suboptimally assessed by CT. Muscles and Tendons Intact appearing extensor mechanism tendons. No intramuscular hemorrhage or mass. Soft tissues Mild subcutaneous soft tissue edema about the fracture. Atherosclerotic calcifications of the included popliteal and proximal tibial arteries. IMPRESSION: Osteopenic appearance of the left knee limiting fine bony detail. 1. The fracture involving the proximal tibial metaphysis extends to the level of the tibial tuberosity where there is partial avulsion noted. The fracture does not appear to extend into the knee joint as initially suspected radiographically. This is further supported by lack of a joint effusion. Slight anterior impaction of the tibial diaphysis upon the metaphysis is suggested on the sagittal images. Slight volar angulation is also noted of the tibial diaphysis. 2. Acute proximal fibular diaphyseal fracture with slight anterior and medial displacement of the main fracture fragment. Electronically Signed   By: Tollie Eth M.D.   On: 01/14/2018 19:55   Dg Knee Complete 4 Views Left  Result Date: 01/14/2018 CLINICAL DATA:  Fibular fracture EXAM:  LEFT KNEE - COMPLETE 4+ VIEW COMPARISON:  LEFT tibial and fibular radiographs of 01/14/2018 FINDINGS: Diffuse osseous demineralization. Nondisplaced transverse fracture of the proximal LEFT fibular diaphysis with minimal apex medial angulation. Narrowing of knee joint spaces. Femur and patella appear grossly intact. Mildly displaced fracture of the proximal tibial metaphysis, extent poorly delineated due to degree of demineralization but at least involving the lateral plateau and suspect transversely at the metaphysis. Extensive atherosclerotic calcifications and regional soft tissue swelling. IMPRESSION: Transverse displaced fracture of the proximal LEFT fibular diaphysis. Probable transverse metaphyseal fracture of the proximal LEFT tibia at with extension into the lateral plateau, poorly delineated due to degree of demineralization; CT of the LEFT knee recommended to characterize. Electronically Signed   By: Ulyses Southward M.D.   On: 01/14/2018 17:55   Dg Knee Left Port  Result Date: 01/16/2018 CLINICAL DATA:  ORIF proximal tibial fracture. EXAM: PORTABLE LEFT KNEE - 1-2 VIEW COMPARISON:  Prior studies FINDINGS: Internal plate screw fixation noted traversing a proximal tibial fracture, in near-anatomic alignment and position. A mildly displaced proximal fibular fracture is identified. No complicating features are identified. IMPRESSION: ORIF proximal tibial fracture, in near-anatomic alignment and position. Electronically Signed   By: Harmon Pier M.D.   On: 01/16/2018 14:16   Dg C-arm 1-60 Min-no Report  Result Date: 01/16/2018 Fluoroscopy was utilized by the requesting physician.  No radiographic interpretation.     ASSESSMENT AND PLAN:   67 year old male with past medical history of hypertension, hyperlipidemia, depression who presented to the hospital after mechanical fall and noted to have a left fibular and tibial fracture.  1.  Status post fall and left tibial and fibular fracture- seen by  orthopedics, and patient taken to the operating room and status post open reduction internal fixation today. -Continue pain control and further care as per orthopedics.  We will get physical therapy tomorrow. -Continue  supportive care with pain control with Oxycodone  2.  Essential hypertension-continue lisinopril/HCTZ.  3.  Hyperlipidemia-continue Pravachol.  4.  Depression-continue Cymbalta, Wellbutrin.  5. Tobacco abuse - cont. Nicotine patch.   6.  Neuropathy-continue gabapentin.  All the records are reviewed and case discussed with Care Management/Social Worker. Management plans discussed with the patient, family and they are in agreement.  CODE STATUS: Full code  DVT Prophylaxis: Lovenox  TOTAL TIME TAKING CARE OF THIS PATIENT: 30 minutes.   POSSIBLE D/C IN 1-2 DAYS, DEPENDING ON CLINICAL CONDITION.   Houston Siren M.D on 01/16/2018 at 4:00 PM  Between 7am to 6pm - Pager - [phone]  After 6pm go to [email or web address] - Social research officer, government  [facility]  Hospitalists  Office  [phone]  CC: Primary care physician; Leanna Sato, MD

## 2018-01-17 LAB — CBC
HCT: 32.3 % — ABNORMAL LOW (ref 39.0–52.0)
Hemoglobin: 10.5 g/dL — ABNORMAL LOW (ref 13.0–17.0)
MCH: 32 pg (ref 26.0–34.0)
MCHC: 32.5 g/dL (ref 30.0–36.0)
MCV: 98.5 fL (ref 80.0–100.0)
NRBC: 0 % (ref 0.0–0.2)
PLATELETS: 243 10*3/uL (ref 150–400)
RBC: 3.28 MIL/uL — ABNORMAL LOW (ref 4.22–5.81)
RDW: 13.2 % (ref 11.5–15.5)
WBC: 17.2 10*3/uL — AB (ref 4.0–10.5)

## 2018-01-17 LAB — BASIC METABOLIC PANEL
ANION GAP: 8 (ref 5–15)
BUN: 16 mg/dL (ref 8–23)
CALCIUM: 8.4 mg/dL — AB (ref 8.9–10.3)
CO2: 27 mmol/L (ref 22–32)
Chloride: 105 mmol/L (ref 98–111)
Creatinine, Ser: 0.91 mg/dL (ref 0.61–1.24)
Glucose, Bld: 120 mg/dL — ABNORMAL HIGH (ref 70–99)
Potassium: 4.8 mmol/L (ref 3.5–5.1)
Sodium: 140 mmol/L (ref 135–145)

## 2018-01-17 NOTE — Anesthesia Postprocedure Evaluation (Signed)
Anesthesia Post Note  Patient: Alejandro Cook  Procedure(s) Performed: CLOSED REDUCTION TIBIA VS. ORIF TIBIA (Left ) OPEN REDUCTION INTERNAL FIXATION (ORIF) TIBIA FRACTURE (Left )  Patient location during evaluation: PACU Anesthesia Type: General Level of consciousness: awake and alert Pain management: pain level controlled Vital Signs Assessment: post-procedure vital signs reviewed and stable Respiratory status: spontaneous breathing, nonlabored ventilation and respiratory function stable Cardiovascular status: blood pressure returned to baseline and stable Postop Assessment: no apparent nausea or vomiting Anesthetic complications: no     Last Vitals:  Vitals:   01/17/18 0302 01/17/18 0742  BP: 138/81 139/83  Pulse: 61 66  Resp: 15 18  Temp: 36.6 C 36.8 C  SpO2: 99% 100%    Last Pain:  Vitals:   01/17/18 1003  TempSrc:   PainSc: 7                  Jovita Gamma

## 2018-01-17 NOTE — Progress Notes (Signed)
Physical Therapy Treatment Patient Details Name: Alejandro Cook MRN: [id number] DOB: 1951-03-29 Today's Date: 01/17/2018    History of Present Illness Pt is a 67 y/o M s/p fall with resultant L tibial and fibular fx.  Pt is now s/p ORIF.  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     PT Comments    Mr. Pundt made modest progress toward mobility goals this session but continues to require assist for bed mobility, transfers, and short distance ambulation.  Pt demonstrates poor safety awareness and imbalance while ambulating despite updated status to TTWB with LLE per Dr. Martha Clan.  Time was spent demonstrating and discussing safe management of RW with ambulation.  Given pt's current mobility status, recommendation for SNF remains appropriate; however, pt desires to return home at d/c.  Pt has two steps to enter home and will need a WC to allow entry into his home and to navigate inside home.    Patient with surgery above which impairs his ability to perform daily activities like toileting, feeding, dressing, grooming, and bathing in the home. A walker will not resolve the patient's issue with performing activities of daily living. A wheelchair is required/recommended and will allow patient to safely perform daily activities.   Patient can safely propel the wheelchair in the home or has a caregiver who can provide assistance.     Follow Up Recommendations  SNF(HHPT with 24/7 assist/sup if pt continues to refuse SNF)     Equipment Recommendations  Wheelchair (measurements PT);Wheelchair cushion (measurements PT);Other (comment)(WC with elevating leg rests)    Recommendations for Other Services OT consult     Precautions / Restrictions Precautions Precautions: Fall Required Braces or Orthoses: Knee Immobilizer - Left Knee Immobilizer - Left: On at all times Restrictions Weight Bearing Restrictions: Yes LLE Weight Bearing: Touchdown weight bearing Other  Position/Activity Restrictions: Dr. Martha Clan verbally updated WB status to TTWB for improved balance on 10/9 at 15:00.     Mobility  Bed Mobility Overal bed mobility: Needs Assistance Bed Mobility: Sit to Supine       Sit to supine: Min assist;+2 for physical assistance   General bed mobility comments: Pt requires assist to bring LLE into bed and to direct trunk for sit>supine.   Transfers Overall transfer level: Needs assistance Equipment used: Rolling walker (2 wheeled) Transfers: Sit to/from Stand Sit to Stand: +2 physical assistance;Min assist         General transfer comment: Assist to boost to standing and to remain steady.  To sit, cues to reach back for armrests.  Min assist for controlled descent to sit.   Ambulation/Gait Ambulation/Gait assistance: Min assist;+2 safety/equipment Gait Distance (Feet): 10 Feet Assistive device: Rolling walker (2 wheeled) Gait Pattern/deviations: (hop to) Gait velocity: decreased Gait velocity interpretation: <1.31 ft/sec, indicative of household ambulator General Gait Details: Pt adhered to TTWB by not weight shifting to LLE but just allowing toe to touch for improved balance.  Pt continues to hop at times even when he is unsteady and pt requires frequent verbal cues to slow down.  Demonstrated safe technique after ambulating and pt verbalized understanding.    Stairs             Wheelchair Mobility    Modified Rankin (Stroke Patients Only)       Balance Overall balance assessment: Needs assistance;History of Falls Sitting-balance support: Single extremity supported;Feet supported Sitting balance-Leahy Scale: Poor Sitting balance - Comments: Pt relies on at least  1UE support while sitting EOB   Standing balance support: Bilateral upper extremity supported;During functional activity Standing balance-Leahy Scale: Poor Standing balance comment: Pt relies on BUE support for static and dynamic activities                             Cognition Arousal/Alertness: Awake/alert Behavior During Therapy: WFL for tasks assessed/performed Overall Cognitive Status: Within Functional Limits for tasks assessed                                        Exercises General Exercises - Lower Extremity Ankle Circles/Pumps: AROM;Both;10 reps;Seated Hip ABduction/ADduction: AAROM;Both;10 reps;Seated Straight Leg Raises: AAROM;Both;10 reps;Seated Other Exercises Other Exercises: Pt educated in polar care and compression sock mgt including wear schedule, donning/doffing, and positioning. Other Exercises: Pt educated in and practiced use of DME and AE for LB dressing. Pt practiced with sock aid to don/doff sock on RLE. Other Exercises: Pt educated in home environment modifications for increased safety and independence.    General Comments        Pertinent Vitals/Pain Pain Assessment: Faces Pain Score: 7  Faces Pain Scale: Hurts even more Pain Location: LLE with mobility Pain Descriptors / Indicators: Aching;Grimacing;Guarding Pain Intervention(s): Limited activity within patient's tolerance;Monitored during session;Repositioned;Ice applied    Home Living Family/patient expects to be discharged to:: Private residence Living Arrangements: Alone Available Help at Discharge: Family;Available PRN/intermittently(Son available PRN) Type of Home: House Home Access: Stairs to enter   Home Layout: Able to live on main level with bedroom/bathroom Home Equipment: Walker - 2 wheels;Walker - standard;Bedside commode;Shower seat      Prior Function Level of Independence: Needs assistance  Gait / Transfers Assistance Needed: Pt ambulates household and community distances with 2WW. ADL's / Homemaking Assistance Needed: Pt independent for cooking, feeding, sponge bath (due to fear of falling/inability to get LLE into tub). Friend helps with errands, laundry (unable to go down steps into basement with  laundry room) and light house cleaning.     PT Goals (current goals can now be found in the care plan section) Acute Rehab PT Goals Patient Stated Goal: to return to PLOF PT Goal Formulation: With patient Time For Goal Achievement: 01/31/18 Potential to Achieve Goals: Fair Progress towards PT goals: Progressing toward goals    Frequency    BID      PT Plan Equipment recommendations need to be updated    Co-evaluation              AM-PAC PT "6 Clicks" Daily Activity  Outcome Measure  Difficulty turning over in bed (including adjusting bedclothes, sheets and blankets)?: Unable Difficulty moving from lying on back to sitting on the side of the bed? : Unable Difficulty sitting down on and standing up from a chair with arms (e.g., wheelchair, bedside commode, etc,.)?: Unable Help needed moving to and from a bed to chair (including a wheelchair)?: A Lot Help needed walking in hospital room?: A Lot Help needed climbing 3-5 steps with a railing? : Total 6 Click Score: 8    End of Session Equipment Utilized During Treatment: Gait belt Activity Tolerance: Patient tolerated treatment well;Patient limited by fatigue Patient left: with call bell/phone within reach;in bed;Other (comment)(LLE elevated on pillow;pt agrees no OOB without assist) Nurse Communication: Mobility status;Other (comment)(bed alarm not working) PT Visit Diagnosis: Pain;Muscle weakness (generalized) (M62.81);History of  falling (Z91.81);Unsteadiness on feet (R26.81);Other abnormalities of gait and mobility (R26.89);Difficulty in walking, not elsewhere classified (R26.2) Pain - Right/Left: Left Pain - part of body: Knee     Time: 8469-6295 PT Time Calculation (min) (ACUTE ONLY): 36 min  Charges:  $Gait Training: 8-22 mins $Therapeutic Exercise: 8-22 mins                     Encarnacion Chu PT, DPT 01/17/2018, 3:29 PM

## 2018-01-17 NOTE — Evaluation (Signed)
Occupational Therapy Evaluation Patient Details Name: Alejandro Cook MRN: [id number] DOB: 1951-03-02 Today's Date: 01/17/2018    History of Present Illness Pt is a 67 y/o M s/p fall with resultant L tibial and fibular fx.  Pt is now s/p ORIF.  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    Clinical Impression   Pt seen for OT evaluation this date. Pt is a 67 y/o male with a L tibial and fibular fx. Prior to admission, pt enjoyed spending time reading and taking care of his pet. Pt lives in alone in a 2 story house with the ability to live on the main floor during recovery. Pt home has two steps to enter without rail. Pt's  son will provide intermittent assistance for ADL tasks ( LB dressing) and Pt has a neighbor/friend to help with IADLs such as running errands, laundry and light house keeping. Currently, pt demonstrates impairments with pain ( pain level 6-7 throughout session 2/2 chronic L hip pain and fx), balance, ROM, and strength requiring MIN/MOD assist with sit to stand transfer and MOD assist for LB ADL. Pt educated in falls prevention strategies, home environment modifications, and use of DME and AE to maximize safety and independence and minimize falls risk. Pt verbalized understanding to all education/training provided this date. Pt will benefit from skilled OT services to address noted impairments and functional deficits in order to minimize caregiver burden and falls risk and maximize independence.  Will continue to progress. OT recommends STR upon discharge.     Follow Up Recommendations  SNF    Equipment Recommendations  Other (comment)(sock aid)    Recommendations for Other Services       Precautions / Restrictions Precautions Precautions: Other (comment);Fall;Knee Required Braces or Orthoses: Knee Immobilizer - Left Knee Immobilizer - Left: On at all times Restrictions Weight Bearing Restrictions: Yes LLE Weight Bearing: Non weight  bearing Other Position/Activity Restrictions: NWB LLE verbal confirmation from Dr. Martha Clan in am of 10/9.       Mobility Bed Mobility            General bed mobility comments: Pt presented in recliner.   Transfers Overall transfer level: Needs assistance Equipment used: Rolling walker (2 wheeled) Transfers: Sit to/from Stand Sit to Stand: Mod assist        General transfer comment: Required MIN/MOD assist from Sit to stand. Cues provided for hand/foot placement and how to adhere to NWB for transfer.     Balance Overall balance assessment: Needs assistance Sitting-balance support: Bilateral upper extremity supported;Feet supported Standing balance-Leahy Scale: Poor    Standing balance support: Bilateral upper extremity supported;During functional activity Standing balance-Leahy Scale: Poor Standing balance comment: Pt relies on BUE support for sit to stand transfer.                           ADL either performed or assessed with clinical judgement   ADL Overall ADL's : Needs assistance/impaired Eating/Feeding: Independent   Grooming: Sitting;Independent   Upper Body Bathing: Sitting; Independent  Lower Body Bathing: Moderate assistance; Sitting/lateral leans  Upper Body Dressing : Sitting; Modified independent  Upper Body Dressing Details (indicate cue type and reason): Extra time/effort Lower Body Dressing: Sit to/from stand;With adaptive equipment;Moderate assistance Lower Body Dressing Details (indicate cue type and reason): Pt educated in and practiced with reacher and sock aid to assist with LB dressing. Donning/doffing pants made more difficult by [facility]  on LLE. Toilet Transfer: BSC;Moderate assistance;Cueing for safety, RW          Functional mobility during ADLs: Rolling walker;Cueing for safety;Moderate assistance       Vision Baseline Vision/History: Wears glasses Wears Glasses: Reading only Patient Visual Report: No change from  baseline       Perception     Praxis      Pertinent Vitals/Pain Pain Assessment: 0-10 Pain Score: 7  Pain Location: LLE with sitting and mobility Pain Descriptors / Indicators: Other (Comment)("Twinging" pain with motion of LLE) Pain Intervention(s): Limited activity within patient's tolerance;Monitored during session;Repositioned     Hand Dominance     Extremity/Trunk Assessment Upper Extremity Assessment Upper Extremity Assessment: LUE deficits/detail (tingling in L fingers) LUE deficits/detail Comments: Strength grossly WFL, however, appears to have limited wrist ext/flex control after weightbearing through LUE during transfer, with dorsal aspect of L hand coming to rest on the top of the arm rail and difficult to get hand placed on the RW   Lower Extremity Assessment Lower Extremity Assessment: LLE deficits/detail  LLE Deficits / Details: Pt keeps L hip in adduction while sitting and during transfer from sit to stand LLE: Unable to fully assess due to pain       Communication Communication Communication: No difficulties   Cognition Arousal/Alertness: Awake/alert Behavior During Therapy: WFL for tasks assessed/performed Overall Cognitive Status: Within Functional Limits for tasks assessed                                     General Comments       Exercises  Other Exercises: Pt educated in polar care and compression sock mgt including wear schedule, donning/doffing, and positioning. Other Exercises: Pt educated in and practiced use of DME and AE for LB dressing. Pt practiced with sock aid to don/doff sock on RLE. Other Exercises: Pt educated in home environment modifications for increased safety and independence.   Shoulder Instructions      Home Living Family/patient expects to be discharged to:: Private residence Living Arrangements: Alone Available Help at Discharge: Family;Available PRN/intermittently(Son available PRN) Type of Home:  House Home Access: Stairs to enter [employer] of Steps: 2 Entrance Stairs-Rails: (post on the R) Home Layout: Able to live on main level with bedroom/bathroom     Bathroom Shower/Tub: Chief Strategy Officer: Standard Bathroom Accessibility: Yes   Home Equipment: Environmental consultant - 2 wheels;Walker - standard;Bedside commode;Shower seat          Prior Functioning/Environment Level of Independence: Needs assistance  Gait / Transfers Assistance Needed: Pt ambulates household and community distances with 2WW. ADL's / Homemaking Assistance Needed: Pt independent for cooking, feeding, sponge bath (due to fear of falling/inability to get LLE into tub). Friend helps with errands, laundry (unable to go down steps into basement with laundry room) and light house cleaning.            OT Problem List: Decreased strength;Impaired balance (sitting and/or standing);Pain;Decreased range of motion;Decreased safety awareness;Decreased activity tolerance;Decreased knowledge of use of DME or AE      OT Treatment/Interventions: Self-care/ADL training;Therapeutic exercise;Therapeutic activities;DME and/or AE instruction;Patient/family education    OT Goals(Current goals can be found in the care plan section) Acute Rehab OT Goals Patient Stated Goal: get back to taking care of pet OT Goal Formulation: With patient Time For Goal Achievement: 01/31/18 Potential to Achieve Goals: Good ADL Goals Pt Will Perform  Lower Body Dressing: sit to/from stand;with adaptive equipment;with modified independence Pt Will Transfer to Toilet: regular height toilet;ambulating;with min guard assist Additional ADL Goal #1: Pt will utilize 2-3  falls prevention strategies while completing ADL tasks for increased safety.  OT Frequency: Min 2X/week   Barriers to D/C:            Co-evaluation              AM-PAC PT "6 Clicks" Daily Activity     Outcome Measure Help from another person eating  meals?: None Help from another person taking care of personal grooming?: None Help from another person toileting, which includes using toliet, bedpan, or urinal?: A Lot Help from another person bathing (including washing, rinsing, drying)?: A Little Help from another person to put on and taking off regular upper body clothing?: None Help from another person to put on and taking off regular lower body clothing?: A Lot 6 Click Score: 19   End of Session Equipment Utilized During Treatment: Gait belt;Rolling walker;Left knee immobilizer;Other (comment)(reacher and sock aid)  Activity Tolerance: Patient tolerated treatment well Patient left: in chair;with chair alarm set;with call bell/phone within reach  OT Visit Diagnosis: Other abnormalities of gait and mobility (R26.89);Pain Pain - Right/Left: Left Pain - part of body: Hip;Leg                Time: 1610-9604 OT Time Calculation (min): 62 min Charges:     Gypsy Balsam OTS  Nolberto Hanlon Adamariz Gillott 01/17/2018, 1:34 PM

## 2018-01-17 NOTE — Progress Notes (Signed)
PT is recommending SNF. Clinical Education officer, museum (CSW) met patient to discuss D/C plan. Patient refused SNF. CSW asked how patient would get to the bathroom and he asked for a bedside commode. Per patient he wants to go home and will have his son checking on him. Patient requested home health and stated that he already has a walker. RN case manager aware of above. CSW will continue to follow and assist as needed.   [employer], LCSW [phone]

## 2018-01-17 NOTE — Progress Notes (Signed)
[facility] - [place] [facility]      PATIENT NAME: Alejandro Cook    MR#:  [id number]  DATE OF BIRTH:  Mar 13, 1951  SUBJECTIVE:   Patient is status post open reduction internal fixation of the left fibular and tibial fracture POD # 1. Today. Pain is better controlled today. Seen by PT but pt is refusing SNF.   REVIEW OF SYSTEMS:    Review of Systems  Constitutional: Negative for chills and fever.  HENT: Negative for congestion and tinnitus.   Eyes: Negative for blurred vision and double vision.  Respiratory: Negative for cough, shortness of breath and wheezing.   Cardiovascular: Negative for chest pain, orthopnea and PND.  Gastrointestinal: Negative for abdominal pain, diarrhea, nausea and vomiting.  Genitourinary: Negative for dysuria and hematuria.  Musculoskeletal: Positive for falls and joint pain (Left leg pain).  Neurological: Negative for dizziness, sensory change and focal weakness.  All other systems reviewed and are negative.   Nutrition: Regular  Tolerating Diet: Yes Tolerating PT: Eval noted.   DRUG ALLERGIES:  No Known Allergies  VITALS:  Blood pressure 109/72, pulse 83, temperature 98.5 F (36.9 C), temperature source Oral, resp. rate 18, height 5\' 11"  (1.803 m), weight 90.8 kg, SpO2 98 %.  PHYSICAL EXAMINATION:   Physical Exam  GENERAL:  67 y.o.-year-old patient lying in bed in no acute distress.  EYES: Pupils equal, round, reactive to light and accommodation. No scleral icterus. Extraocular muscles intact.  HEENT: Head atraumatic, normocephalic. Oropharynx and nasopharynx clear.  NECK:  Supple, no jugular venous distention. No thyroid enlargement, no tenderness.  LUNGS: Normal breath sounds bilaterally, no wheezing, rales, rhonchi. No use of accessory muscles of respiration.  CARDIOVASCULAR: S1, S2 normal. No murmurs, rubs, or gallops.  ABDOMEN: Soft, nontender, nondistended. Bowel sounds present. No organomegaly or mass.    EXTREMITIES: No cyanosis, clubbing or edema b/l. Left leg s/p ORIF and in Immobilizer NEUROLOGIC: Cranial nerves II through XII are intact. No focal Motor or sensory deficits b/l.  PSYCHIATRIC: The patient is alert and oriented x 3.  SKIN: No obvious rash, lesion, or ulcer.   Foley Cath in place with yellow urine draining.    LABORATORY PANEL:   CBC Recent Labs  Lab 01/17/18 0351  WBC 17.2*  HGB 10.5*  HCT 32.3*  PLT 243   ------------------------------------------------------------------------------------------------------------------  Chemistries  Recent Labs  Lab 01/17/18 0351  NA 140  K 4.8  CL 105  CO2 27  GLUCOSE 120*  BUN 16  CREATININE 0.91  CALCIUM 8.4*   ------------------------------------------------------------------------------------------------------------------  Cardiac Enzymes No results for input(s): TROPONINI in the last 168 hours. ------------------------------------------------------------------------------------------------------------------  RADIOLOGY:  Dg Tibia/fibula Left  Result Date: 01/16/2018 CLINICAL DATA:  ORIF tibial fracture. EXAM: LEFT TIBIA AND FIBULA - 2 VIEW COMPARISON:  01/14/2018 radiographs FINDINGS: Intraoperative spot views of the proximal tibia are submitted postoperatively for interpretation. Internal plate and screw fixation noted traversing a proximal tibial fracture, in near-anatomic alignment and position. No definite hardware complicating features noted. A minimally displaced fibular fracture is unchanged. IMPRESSION: ORIF proximal tibial fracture, in near-anatomic alignment and position. Electronically Signed   By: Harmon Pier M.D.   On: 01/16/2018 12:45   Dg Knee Left Port  Result Date: 01/16/2018 CLINICAL DATA:  ORIF proximal tibial fracture. EXAM: PORTABLE LEFT KNEE - 1-2 VIEW COMPARISON:  Prior studies FINDINGS: Internal plate screw fixation noted traversing a proximal tibial fracture, in near-anatomic alignment  and position. A mildly displaced proximal fibular fracture is identified. No  complicating features are identified. IMPRESSION: ORIF proximal tibial fracture, in near-anatomic alignment and position. Electronically Signed   By: Harmon Pier M.D.   On: 01/16/2018 14:16   Dg C-arm 1-60 Min-no Report  Result Date: 01/16/2018 Fluoroscopy was utilized by the requesting physician.  No radiographic interpretation.     ASSESSMENT AND PLAN:   67 year old male with past medical history of hypertension, hyperlipidemia, depression who presented to the hospital after mechanical fall and noted to have a left fibular and tibial fracture.  1.  Status post fall and left tibial and fibular fracture- seen by orthopedics, and patient taken to the operating room and status post open reduction internal fixation POD #1. -Controlled with oral oxycodone.  PT eval noted.  Patient is refusing short-term rehab. - We will discharge home tomorrow with home health services. Appreciate orthopedics input.  Continue Lovenox for DVT prophylaxis.  2.  Essential hypertension-continue lisinopril/HCTZ.  3.  Hyperlipidemia-continue Pravachol.  4.  Depression-continue Cymbalta, Wellbutrin.  5. Tobacco abuse - cont. Nicotine patch.   6.  Neuropathy-continue gabapentin.  Likely discharge home tomorrow with home health services.  All the records are reviewed and case discussed with Care Management/Social Worker. Management plans discussed with the patient, family and they are in agreement.  CODE STATUS: Full code  DVT Prophylaxis: Lovenox  TOTAL TIME TAKING CARE OF THIS PATIENT: 30 minutes.   POSSIBLE D/C IN 1-2 DAYS, DEPENDING ON CLINICAL CONDITION.   Houston Siren M.D on 01/17/2018 at 4:57 PM  Between 7am to 6pm - Pager - [phone]  After 6pm go to [email or web address] - Social research officer, government  [facility]  [facility]  Office  [phone]  CC: Primary care physician; Leanna Sato, MD

## 2018-01-17 NOTE — Care Management (Signed)
IV Toradol injections discontinued today.  Anticipate discharge home tomorrow.  No agency preference.  Referral to [facility] for PT OT and aide and BSC.  Patient declines skilled nursing placement which is of concern for physical therapy due to patient's non weight bearing status on the left.  Patient is strong in his decision to discharge directly home

## 2018-01-17 NOTE — Care Management (Signed)
Patient will also require wheelchair.  notified [facility]. Physical therapy entered medical necessity documentation in their note for the wheelchair.  Patient requesting ems transport home tomorrow.  CM discussed that could not guarantee insurance will cover.  Also discussed would have to ask ems I [facility] if wheelchair could travel home in the truck with patient.  If not, his son that works at [employer] in IT could come to unit to take the wheelchair home.  EMS form completed and printed

## 2018-01-17 NOTE — Evaluation (Signed)
Physical Therapy Evaluation Patient Details Name: Alejandro Cook MRN: [id number] DOB: 12-Dec-1950 Today's Date: 01/17/2018   History of Present Illness  Pt is a 67 y/o M s/p fall with resultant L tibial and fibular fx.  Pt is now s/p ORIF.  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     Clinical Impression  Patient is s/p above surgery resulting in functional limitations due to the deficits listed below (see PT Problem List). At baseline pt with L hip AVN and L knee OA and pt reports dragging his LLE when he ambulates at baseline.  He currently requires a L KI at all times and keeps L hip in ER and adduction, encouraged pt to keep L hip in neutral alignment for proper healing.  Pt currently requires min assist for bed mobility, min +2 assist for transfers with cues to adhere to NWB LLE as pt intermittently demonstrates TDWB with stand pivot transfer.  Pt lives alone with steps to enter his home.  Given pt's current mobility status, recommending SNF at d/c.   Patient will benefit from skilled PT to increase their independence and safety with mobility to allow discharge to the venue listed below.      Follow Up Recommendations SNF    Equipment Recommendations  None recommended by PT    Recommendations for Other Services OT consult     Precautions / Restrictions Precautions Precautions: Other (comment);Fall;Knee Required Braces or Orthoses: Knee Immobilizer - Left Knee Immobilizer - Left: On at all times Restrictions Weight Bearing Restrictions: Yes LLE Weight Bearing: Non weight bearing Other Position/Activity Restrictions: NWB LLE verbal confirmation from Dr. Martha Clan in am of 10/9.       Mobility  Bed Mobility Overal bed mobility: Needs Assistance Bed Mobility: Supine to Sit     Supine to sit: Min assist;HOB elevated     General bed mobility comments: Assist to advance LLE to EOB. Pt uses bed rail.  Increased effort and time.   Transfers Overall  transfer level: Needs assistance Equipment used: Rolling walker (2 wheeled) Transfers: Sit to/from [employer] Sit to Stand: +2 physical assistance;From elevated surface;Min assist Stand pivot transfers: Min assist;+2 physical assistance;+2 safety/equipment       General transfer comment: Bed elevated as pt with knee immobilizer.  Cues for how to adhere to NWB with transfer.  Assist to boost to standing and to remain steady with pivot.  Pt requires verbal cues when pivoting to avoid TDWB, but to adhere to NWB.    Ambulation/Gait             General Gait Details: Not safe to attempt at this time  Stairs            Wheelchair Mobility    Modified Rankin (Stroke Patients Only)       Balance Overall balance assessment: Needs assistance;History of Falls Sitting-balance support: Single extremity supported;Feet supported Sitting balance-Leahy Scale: Poor Sitting balance - Comments: Pt relies on at least 1UE support while sitting EOB   Standing balance support: Bilateral upper extremity supported;During functional activity Standing balance-Leahy Scale: Poor Standing balance comment: Pt relies on BUE support for static and dynamic activities                             Pertinent Vitals/Pain Pain Assessment: 0-10 Pain Score: 9  Pain Location: LLE with mobility(6/10 at rest) Pain Descriptors / Indicators: Aching;Grimacing;Guarding;Spasm Pain  Intervention(s): Limited activity within patient's tolerance;Monitored during session;Repositioned    Home Living Family/patient expects to be discharged to:: Private residence Living Arrangements: Alone Available Help at Discharge: Family;Available PRN/intermittently(Son available PRN) Type of Home: House Home Access: Stairs to enter Entrance Stairs-Rails: (post on the R) Entrance Stairs-Number of Steps: 2 Home Layout: Able to live on main level with bedroom/bathroom Home Equipment: Walker - 2  wheels;Walker - standard;Bedside commode;Shower seat      Prior Function Level of Independence: Needs assistance   Gait / Transfers Assistance Needed: Pt ambulates household and community distances with 2WW.  ADL's / Homemaking Assistance Needed: Pt independent for cooking, feeding, sponge bath (due to fear of falling/inability to get LLE into tub). Friend helps with errands, laundry (unable to go down steps into basement with laundry room) and light house cleaning.        Hand Dominance        Extremity/Trunk Assessment   Upper Extremity Assessment Upper Extremity Assessment: Defer to OT evaluation    Lower Extremity Assessment Lower Extremity Assessment: LLE deficits/detail;RLE deficits/detail RLE Deficits / Details: Strength grossly 4+/5 LLE Deficits / Details: Pt keeps L hip in ER and adduction (pt unable to say if this is normal for him at baseline, "I don't pay too much attention".  Strength grossly 2-/5 and limited by pain LLE: Unable to fully assess due to immobilization;Unable to fully assess due to pain       Communication   Communication: No difficulties  Cognition Arousal/Alertness: Awake/alert Behavior During Therapy: WFL for tasks assessed/performed Overall Cognitive Status: Within Functional Limits for tasks assessed                                        General Comments      Exercises General Exercises - Lower Extremity Ankle Circles/Pumps: AROM;Both;10 reps;Supine Hip ABduction/ADduction: AAROM;Both;10 reps;Supine Straight Leg Raises: AAROM;Both;10 reps;Supine Other Exercises Other Exercises: Marching in standing with BUE support on RW x10 each LE   Assessment/Plan    PT Assessment Patient needs continued PT services  PT Problem List Decreased strength;Decreased range of motion;Decreased activity tolerance;Decreased balance;Decreased mobility;Decreased knowledge of use of DME;Decreased safety awareness;Pain;Decreased knowledge of  precautions       PT Treatment Interventions DME instruction;Gait training;Stair training;Functional mobility training;Therapeutic activities;Therapeutic exercise;Balance training;Neuromuscular re-education;Patient/family education;Wheelchair mobility training;Modalities    PT Goals (Current goals can be found in the Care Plan section)  Acute Rehab PT Goals Patient Stated Goal: to return to PLOF PT Goal Formulation: With patient Time For Goal Achievement: 01/31/18 Potential to Achieve Goals: Fair    Frequency BID   Barriers to discharge Inaccessible home environment;Decreased caregiver support Pt lives alone and has steps to enter home    Co-evaluation               AM-PAC PT "6 Clicks" Daily Activity  Outcome Measure Difficulty turning over in bed (including adjusting bedclothes, sheets and blankets)?: Unable Difficulty moving from lying on back to sitting on the side of the bed? : Unable Difficulty sitting down on and standing up from a chair with arms (e.g., wheelchair, bedside commode, etc,.)?: Unable Help needed moving to and from a bed to chair (including a wheelchair)?: A Lot Help needed walking in hospital room?: A Lot Help needed climbing 3-5 steps with a railing? : Total 6 Click Score: 8    End of Session Equipment Utilized During Treatment:  Gait belt Activity Tolerance: Patient tolerated treatment well;Patient limited by fatigue Patient left: in chair;with call bell/phone within reach;with chair alarm set Nurse Communication: Mobility status;Weight bearing status;Other (comment)(KI at all times) PT Visit Diagnosis: Pain;Muscle weakness (generalized) (M62.81);History of falling (Z91.81);Unsteadiness on feet (R26.81);Other abnormalities of gait and mobility (R26.89);Difficulty in walking, not elsewhere classified (R26.2) Pain - Right/Left: Left Pain - part of body: Knee    Time: 9147-8295 PT Time Calculation (min) (ACUTE ONLY): 45 min   Charges:   PT  Evaluation $PT Eval Moderate Complexity: 1 Mod PT Treatments $Therapeutic Exercise: 8-22 mins $Therapeutic Activity: 8-22 mins        Encarnacion Chu PT, DPT 01/17/2018, 12:58 PM

## 2018-01-17 NOTE — Progress Notes (Signed)
Subjective:  POD# 1 s/p ORIF left proximal tibia.  Patient reports left knee/leg pain as mild.  Patient OOB to chair.    Objective:   VITALS:   Vitals:   01/16/18 2306 01/17/18 0302 01/17/18 0742 01/17/18 1153  BP: 134/77 138/81 139/83 117/68  Pulse: (!) 58 61 66 88  Resp: 16 15 18 18   Temp: 97.7 F (36.5 C) 97.9 F (36.6 C) 98.2 F (36.8 C) 98.3 F (36.8 C)  TempSrc: Oral  Oral Oral  SpO2: 99% 99% 100% 93%  Weight:      Height:        PHYSICAL EXAM: Left lower extremity: Neurovascular intact Sensation intact distally Intact pulses distally Dorsiflexion/Plantar flexion intact Incision: dressing C/D/I Compartment soft  LABS  Results for orders placed or performed during the hospital encounter of 01/14/18 (from the past 24 hour(s))  CBC     Status: Abnormal   Collection Time: 01/17/18  3:51 AM  Result Value Ref Range   WBC 17.2 (H) 4.0 - 10.5 K/uL   RBC 3.28 (L) 4.22 - 5.81 MIL/uL   Hemoglobin 10.5 (L) 13.0 - 17.0 g/dL   HCT 16.1 (L) 09.6 - 04.5 %   MCV 98.5 80.0 - 100.0 fL   MCH 32.0 26.0 - 34.0 pg   MCHC 32.5 30.0 - 36.0 g/dL   RDW 40.9 81.1 - 91.4 %   Platelets 243 150 - 400 K/uL   nRBC 0.0 0.0 - 0.2 %  Basic metabolic panel     Status: Abnormal   Collection Time: 01/17/18  3:51 AM  Result Value Ref Range   Sodium 140 135 - 145 mmol/L   Potassium 4.8 3.5 - 5.1 mmol/L   Chloride 105 98 - 111 mmol/L   CO2 27 22 - 32 mmol/L   Glucose, Bld 120 (H) 70 - 99 mg/dL   BUN 16 8 - 23 mg/dL   Creatinine, Ser 7.82 0.61 - 1.24 mg/dL   Calcium 8.4 (L) 8.9 - 10.3 mg/dL   GFR calc non Af Amer >60 >60 mL/min   GFR calc Af Amer >60 >60 mL/min   Anion gap 8 5 - 15    Dg Tibia/fibula Left  Result Date: 01/16/2018 CLINICAL DATA:  ORIF tibial fracture. EXAM: LEFT TIBIA AND FIBULA - 2 VIEW COMPARISON:  01/14/2018 radiographs FINDINGS: Intraoperative spot views of the proximal tibia are submitted postoperatively for interpretation. Internal plate and screw fixation noted  traversing a proximal tibial fracture, in near-anatomic alignment and position. No definite hardware complicating features noted. A minimally displaced fibular fracture is unchanged. IMPRESSION: ORIF proximal tibial fracture, in near-anatomic alignment and position. Electronically Signed   By: Harmon Pier M.D.   On: 01/16/2018 12:45   Dg Knee Left Port  Result Date: 01/16/2018 CLINICAL DATA:  ORIF proximal tibial fracture. EXAM: PORTABLE LEFT KNEE - 1-2 VIEW COMPARISON:  Prior studies FINDINGS: Internal plate screw fixation noted traversing a proximal tibial fracture, in near-anatomic alignment and position. A mildly displaced proximal fibular fracture is identified. No complicating features are identified. IMPRESSION: ORIF proximal tibial fracture, in near-anatomic alignment and position. Electronically Signed   By: Harmon Pier M.D.   On: 01/16/2018 14:16   Dg C-arm 1-60 Min-no Report  Result Date: 01/16/2018 Fluoroscopy was utilized by the requesting physician.  No radiographic interpretation.    Assessment/Plan: 1 Day Post-Op   Active Problems:   Tibia fracture  Patient doing well post-op.  Start lovenox 40 mg daily today for DVT prophylaxis.  Continue  for 2 weeks.  Follow up in my office in 10-14 day after discharge.  Continue PT.     Juanell Fairly , MD 01/17/2018, 2:11 PM

## 2018-01-18 LAB — BASIC METABOLIC PANEL
Anion gap: 5 (ref 5–15)
BUN: 20 mg/dL (ref 8–23)
CHLORIDE: 105 mmol/L (ref 98–111)
CO2: 29 mmol/L (ref 22–32)
CREATININE: 0.91 mg/dL (ref 0.61–1.24)
Calcium: 8.3 mg/dL — ABNORMAL LOW (ref 8.9–10.3)
GFR calc Af Amer: >60 mL/min (ref >60)
GFR calc non Af Amer: >60 mL/min (ref >60)
Glucose, Bld: 108 mg/dL — ABNORMAL HIGH (ref 70–99)
Potassium: 4.8 mmol/L (ref 3.5–5.1)
Sodium: 139 mmol/L (ref 135–145)

## 2018-01-18 LAB — CBC
HEMATOCRIT: 30.1 % — AB (ref 39.0–52.0)
HEMOGLOBIN: 9.9 g/dL — AB (ref 13.0–17.0)
MCH: 32 pg (ref 26.0–34.0)
MCHC: 32.9 g/dL (ref 30.0–36.0)
MCV: 97.4 fL (ref 80.0–100.0)
Platelets: 281 10*3/uL (ref 150–400)
RBC: 3.09 MIL/uL — ABNORMAL LOW (ref 4.22–5.81)
RDW: 13.7 % (ref 11.5–15.5)
WBC: 13.4 10*3/uL — ABNORMAL HIGH (ref 4.0–10.5)
nRBC: 0 % (ref 0.0–0.2)

## 2018-01-18 MED ORDER — METHOCARBAMOL 500 MG PO TABS: 500.0000 mg | ORAL_TABLET | Freq: Four times a day (QID) | ORAL | 0 refills | Status: DC | PRN

## 2018-01-18 MED ORDER — OXYCODONE HCL 10 MG PO TABS: 10.0000 mg | ORAL_TABLET | ORAL | 0 refills | Status: DC | PRN

## 2018-01-18 MED ORDER — ENOXAPARIN SODIUM 40 MG/0.4ML ~~LOC~~ SOLN: 40.0000 mg | SUBCUTANEOUS | 0 refills | Status: DC

## 2018-01-18 NOTE — Plan of Care (Signed)

## 2018-01-18 NOTE — Progress Notes (Signed)
Pt has not had a bowel since surgery, Pt refusing suppository or PRN bowel regimen. Pt states " he will have one at home". MD Martha Clan notified. MD Sainani notified and recommended Lactulose, pt notified but also refusing that. Pt educated on the importance of having a bowel movement after surgery.

## 2018-01-18 NOTE — Discharge Summary (Signed)
[facility] [place] [facility]   PATIENT NAME: Alejandro Cook    MR#:  [id number]  DATE OF BIRTH:  1950-07-10  DATE OF ADMISSION:  01/14/2018 ADMITTING PHYSICIAN: Ihor Austin, MD  DATE OF DISCHARGE: 01/18/2018  PRIMARY CARE PHYSICIAN: Leanna Sato, MD    ADMISSION DIAGNOSIS:  Fall [W19.XXXA] Left leg pain [M79.605]  DISCHARGE DIAGNOSIS:  Active Problems:   Tibia fracture   SECONDARY DIAGNOSIS:   Past Medical History:  Diagnosis Date  . Depression   . Hyperlipidemia   . Hypertension     HOSPITAL COURSE:   67 year old male with past medical history of hypertension, hyperlipidemia, depression who presented to the hospital after mechanical fall and noted to have a left fibular and tibial fracture.  1.  Status post fall and left tibial and fibular fracture- seen by orthopedics, and patient taken to the operating room and is status post open reduction internal fixation POD #2. - PT eval noted, but Patient is refusing short-term rehab. - His pain is well controlled on oral oxycodone and Robaxin and he is being discharged on that. -He will be discharged on Lovenox subcutaneously for DVT prophylaxis for 2 weeks and follow-up with orthopedics as outpatient.  2.  Essential hypertension- pt. Will continue lisinopril/HCTZ.  3.  Hyperlipidemia- pt. Will continue Pravachol.  4.  Depression-pt. Will continue Cymbalta, Wellbutrin.    DISCHARGE CONDITIONS:   Stable  CONSULTS OBTAINED:    DRUG ALLERGIES:  No Known Allergies  DISCHARGE MEDICATIONS:   Allergies as of 01/18/2018   No Known Allergies     Medication List    TAKE these medications   buPROPion 150 MG 12 hr tablet Commonly known as:  WELLBUTRIN SR Take 1 tablet by mouth 2 (two) times daily.   diclofenac 75 MG EC tablet Commonly known as:  VOLTAREN Take 1 tablet by mouth 2 (two) times daily.   DULoxetine 60 MG capsule Commonly known as:  CYMBALTA Take 1 capsule by mouth  daily.   enoxaparin 40 MG/0.4ML injection Commonly known as:  LOVENOX Inject 0.4 mLs (40 mg total) into the skin daily for 14 days. Start taking on:  01/19/2018   lisinopril-hydrochlorothiazide 20-12.5 MG tablet Commonly known as:  PRINZIDE,ZESTORETIC Take 1 tablet by mouth daily.   lovastatin 40 MG tablet Commonly known as:  MEVACOR Take 1 tablet by mouth daily.   methocarbamol 500 MG tablet Commonly known as:  ROBAXIN Take 1 tablet (500 mg total) by mouth every 6 (six) hours as needed for muscle spasms.   Oxycodone HCl 10 MG Tabs Take 1 tablet (10 mg total) by mouth every 4 (four) hours as needed for moderate pain (pain score 4-6).            Durable Medical Equipment  (From admission, onward)         Start     Ordered   01/18/18 0902  For home use only DME standard manual wheelchair with seat cushion  Once    Comments:  Patient suffers from Fibular/Tibial fracture which impairs their ability to perform daily activities like dressing and toileting in the home.  A walker will not resolve  issue with performing activities of daily living. A wheelchair will allow patient to safely perform daily activities. Patient can safely propel the wheelchair in the home or has a caregiver who can provide assistance.  Accessories: elevating leg rests (ELRs), wheel locks, extensions and anti-tippers.   01/18/18 0902   01/18/18 0857  For home use  only DME Bedside commode  Once    Question:  Patient needs a bedside commode to treat with the following condition  Answer:  Closed fibular fracture   01/18/18 0857            DISCHARGE INSTRUCTIONS:   DIET:  Cardiac diet  DISCHARGE CONDITION:  Stable  ACTIVITY:  Activity as tolerated  OXYGEN:  Home Oxygen: No.   Oxygen Delivery: room air  DISCHARGE LOCATION:  Home with Home health PT, RN.     If you experience worsening of your admission symptoms, develop shortness of breath, life threatening emergency, suicidal or  homicidal thoughts you must seek medical attention immediately by calling 911 or calling your MD immediately  if symptoms less severe.  You Must read complete instructions/literature along with all the possible adverse reactions/side effects for all the Medicines you take and that have been prescribed to you. Take any new Medicines after you have completely understood and accpet all the possible adverse reactions/side effects.   Please note  You were cared for by a hospitalist during your hospital stay. If you have any questions about your discharge medications or the care you received while you were in the hospital after you are discharged, you can call the unit and asked to speak with the hospitalist on call if the hospitalist that took care of you is not available. Once you are discharged, your primary care physician will handle any further medical issues. Please note that NO REFILLS for any discharge medications will be authorized once you are discharged, as it is imperative that you return to your primary care physician (or establish a relationship with a primary care physician if you do not have one) for your aftercare needs so that they can reassess your need for medications and monitor your lab values.     Today   Pain in the left leg is improved.  Patient denies any chest pains, shortness of breath.  He has not had a bowel movement in multiple days but is refusing laxatives at this time.  He also refuses suppository.  VITAL SIGNS:  Blood pressure (!) 145/80, pulse 81, temperature 98 F (36.7 C), temperature source Oral, resp. rate 18, height 5\' 11"  (1.803 m), weight 90.8 kg, SpO2 94 %.  I/O:    Intake/Output Summary (Last 24 hours) at 01/18/2018 1604 Last data filed at 01/18/2018 1232 Gross per 24 hour  Intake 840 ml  Output 1400 ml  Net -560 ml    PHYSICAL EXAMINATION:   GENERAL:  67 y.o.-year-old patient lying in bed in no acute distress.  EYES: Pupils equal, round,  reactive to light and accommodation. No scleral icterus. Extraocular muscles intact.  HEENT: Head atraumatic, normocephalic. Oropharynx and nasopharynx clear.  NECK:  Supple, no jugular venous distention. No thyroid enlargement, no tenderness.  LUNGS: Normal breath sounds bilaterally, no wheezing, rales, rhonchi. No use of accessory muscles of respiration.  CARDIOVASCULAR: S1, S2 normal. No murmurs, rubs, or gallops.  ABDOMEN: Soft, nontender, nondistended. Bowel sounds present. No organomegaly or mass.  EXTREMITIES: No cyanosis, clubbing or edema b/l. Left leg s/p ORIF and in Immobilizer/cast NEUROLOGIC: Cranial nerves II through XII are intact. No focal Motor or sensory deficits b/l.  PSYCHIATRIC: The patient is alert and oriented x 3.  SKIN: No obvious rash, lesion, or ulcer.   DATA REVIEW:   CBC Recent Labs  Lab 01/18/18 0536  WBC 13.4*  HGB 9.9*  HCT 30.1*  PLT 281    Chemistries  Recent  Labs  Lab 01/18/18 0536  NA 139  K 4.8  CL 105  CO2 29  GLUCOSE 108*  BUN 20  CREATININE 0.91  CALCIUM 8.3*    Cardiac Enzymes No results for input(s): TROPONINI in the last 168 hours.  Microbiology Results  Results for orders placed or performed during the hospital encounter of 01/14/18  Surgical PCR screen     Status: None   Collection Time: 01/15/18  2:34 PM  Result Value Ref Range Status   MRSA, PCR NEGATIVE NEGATIVE Final   Staphylococcus aureus NEGATIVE NEGATIVE Final    Comment: (NOTE) The Xpert SA Assay (FDA approved for NASAL specimens in patients 72 years of age and older), is one component of a comprehensive surveillance program. It is not intended to diagnose infection nor to guide or monitor treatment. Performed at [facility], [address], [place], [place] [address]     RADIOLOGY:  No results found.    Management plans discussed with the patient, family and they are in agreement.  CODE STATUS:     Code Status Orders  (From admission,  onward)         Start     Ordered   01/14/18 2051  Full code  Continuous     01/14/18 2050       TOTAL TIME TAKING CARE OF THIS PATIENT: 40 minutes.    Houston Siren M.D on 01/18/2018 at 4:04 PM  Between 7am to 6pm - Pager - [phone]  After 6pm go to [email or web address] - Social research officer, government  [facility]  Office  [phone]  CC: Primary care physician; Leanna Sato, MD

## 2018-01-18 NOTE — Progress Notes (Signed)
Physical Therapy Treatment Patient Details Name: Alejandro Cook MRN: [id number] DOB: 29-Oct-1950 Today's Date: 01/18/2018    History of Present Illness Pt is a 67 y/o M s/p fall with resultant L tibial and fibular fx.  Pt is now s/p ORIF.  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     PT Comments    Pt ready for session.  Discharge orders noted.  Discussed discharge plan at length.  Pt remains firm that he will go home vs SNF despite safety risks. To edge of bed with increased effort and HOB raised.  Stated he has a hospital bed at home.  Once sitting, tried to simulate home environment as best we could.  Stated he plans to have bed close to wall but far enough so wheelchair can fit.  He will not have much room to turn walker in that area.  Also plans to have bedside commode near bed and education provided on placing it in the corner to brace and prevent backwards or sideways tipping as it is a tip hazard when transferring.  He was fearful during standing asking writer to brace walker with foot "You got it?"  He stated it would be braced against wall at home.  He was able to stand with min/mod a x 2.  He transferred with overall poor posture and balance with +2 min/mod assist to wheelchair.  Pt keeps LLE with some knee flexion, external rotation and back behind him during transfer and does not stand fully on RLE making falls a big concern even with skilled assistance.  Practiced transfer x 2 with little improvement.  He then was educated and able to complete lateral scoot transfer in and out of wheelchair x 4 with arm dropped.  Overall transfer quality and safety was improved but he still requires +1/+2 assist for safety.  Education provided on wheelchair brakes, features and overall safety.  He was fatigued with effort and returned to bed where he required max a x 2 to reposition for comfort.  Pt stated he will have only occasional help at home and that his home is not wide  enough for him to navigate his wheelchair safely.  He will have a wheelchair delivered and plans to have a friend bring a second chair so he can access his kitchen as it will not fit through his kitchen door.  Stated his walker would not fit wither prior to admit so he would have to use cabinets for support with gait or "Jump" the walker over.  Pt is aware he is not able to transfer without assist and does not voice a good plan on how to do this especially when he is home alone.  Pt stated he has a bedpan at home and will stay in bed or place it under him in the wheelchair if needed. Stated his son will help in the am and a friend in the evening but he will be alone the rest of the day.     Discussed at length concerns for pt returning home vs rehab at this point in his recovery.  Pt stated he was not safe with mobility at home prior to fall but was able to manage.  Voiced concerns over his quality of transfers, weight bearing status, general home and ADL management along with further injury with falls.  Pt stated he is aware and will "figure it out."  Pt is aware of recommendations of therapy staff but remains  firm even after session that he is going home.  He has 2 steps into home with 1 rail which he is unable to navigate.  EMS to take pt home.  Care Manager in during session.  Discussed ramp rental but he was not interested in it at this time.    Pt asked me to contact son.  I have tried the number he provided (on whiteboard in room) but it has been busy on all attempts.  I will continue to try throughout the morning.     Follow Up Recommendations  SNF     Equipment Recommendations  Wheelchair (measurements PT);Wheelchair cushion (measurements PT);Other (comment);3in1 (PT)    Recommendations for Other Services       Precautions / Restrictions Precautions Precautions: Fall Required Braces or Orthoses: Knee Immobilizer - Left Knee Immobilizer - Left: On at all times Restrictions Weight  Bearing Restrictions: Yes LLE Weight Bearing: Non weight bearing Other Position/Activity Restrictions: Dr. Martha Clan verbally updated WB status to TTWB for improved balance on 10/9 at 15:00.     Mobility  Bed Mobility Overal bed mobility: Needs Assistance Bed Mobility: Sit to Supine;Supine to Sit     Supine to sit: Min guard;HOB elevated Sit to supine: Min assist;+2 for physical assistance   General bed mobility comments: effortful in and out of bed and pt requried assist to reposition in supine  Transfers Overall transfer level: Needs assistance Equipment used: Rolling walker (2 wheeled) Transfers: Sit to/from Stand;Lateral/Scoot Transfers Sit to Stand: Mod assist;+2 physical assistance;Min assist        Lateral/Scoot Transfers: Min assist;+2 physical assistance General transfer comment: Unsteady in standing with walker and +2 assist for safety.  Poor LE position and pt voices veing uncomfortable in standing.  Prefers lateral scoot transfers with improved safety but remains unsafe to do without assist.  Ambulation/Gait             General Gait Details: Very poor shuffling steps with walker for transfer, unsafe for further gait.  Heavy forward lean on walker with poor LE positioning but seems to maintain TTWB with transfer.   Stairs             Wheelchair Mobility    Modified Rankin (Stroke Patients Only)       Balance Overall balance assessment: Needs assistance;History of Falls Sitting-balance support: Single extremity supported;Feet supported Sitting balance-Leahy Scale: Poor Sitting balance - Comments: Pt relies on at least 1UE support while sitting EOB   Standing balance support: Bilateral upper extremity supported;During functional activity Standing balance-Leahy Scale: Poor Standing balance comment: Pt relies on BUE support for static and dynamic activities                            Cognition Arousal/Alertness: Awake/alert Behavior  During Therapy: WFL for tasks assessed/performed Overall Cognitive Status: Within Functional Limits for tasks assessed                                        Exercises Other Exercises Other Exercises: extensive educaation for safety and discharge plan.    General Comments        Pertinent Vitals/Pain Pain Assessment: 0-10 Pain Score: 6  Pain Location: LLE with mobility Pain Descriptors / Indicators: Aching;Grimacing;Guarding Pain Intervention(s): Limited activity within patient's tolerance;Ice applied    Home Living  Prior Function            PT Goals (current goals can now be found in the care plan section) Progress towards PT goals: Progressing toward goals    Frequency    BID      PT Plan Current plan remains appropriate    Co-evaluation              AM-PAC PT "6 Clicks" Daily Activity  Outcome Measure  Difficulty turning over in bed (including adjusting bedclothes, sheets and blankets)?: Unable Difficulty moving from lying on back to sitting on the side of the bed? : Unable Difficulty sitting down on and standing up from a chair with arms (e.g., wheelchair, bedside commode, etc,.)?: Unable Help needed moving to and from a bed to chair (including a wheelchair)?: A Lot Help needed walking in hospital room?: A Lot Help needed climbing 3-5 steps with a railing? : Total 6 Click Score: 8    End of Session Equipment Utilized During Treatment: Gait belt Activity Tolerance: Patient tolerated treatment well;Patient limited by fatigue Patient left: in bed;with call bell/phone within reach;with SCD's reapplied   Pain - Right/Left: Left Pain - part of body: Knee     Time: 0160-1093 PT Time Calculation (min) (ACUTE ONLY): 28 min  Charges:  $Therapeutic Exercise: 8-22 mins $Therapeutic Activity: 8-22 mins                     Danielle Dess, PTA 01/18/18, 10:17 AM

## 2018-01-18 NOTE — Progress Notes (Signed)
Subjective:  POD#2 s/p ORIF of left proximal tibia fracture. Patient reports left knee pain as mild.    Objective:   VITALS:   Vitals:   01/17/18 1153 01/17/18 1539 01/17/18 2353 01/18/18 0811  BP: 117/68 109/72 128/75 (!) 146/75  Pulse: 88 83 75 70  Resp: 18 18 15 17   Temp: 98.3 F (36.8 C) 98.5 F (36.9 C) 98.5 F (36.9 C) 98.8 F (37.1 C)  TempSrc: Oral Oral  Oral  SpO2: 93% 98% 92% 97%  Weight:      Height:        PHYSICAL EXAM: Left lower extremity: Neurovascular intact Sensation intact distally Intact pulses distally Dorsiflexion/Plantar flexion intact Incision: dressing C/D/I Compartment soft  LABS  Results for orders placed or performed during the hospital encounter of 01/14/18 (from the past 24 hour(s))  CBC     Status: Abnormal   Collection Time: 01/18/18  5:36 AM  Result Value Ref Range   WBC 13.4 (H) 4.0 - 10.5 K/uL   RBC 3.09 (L) 4.22 - 5.81 MIL/uL   Hemoglobin 9.9 (L) 13.0 - 17.0 g/dL   HCT 16.1 (L) 09.6 - 04.5 %   MCV 97.4 80.0 - 100.0 fL   MCH 32.0 26.0 - 34.0 pg   MCHC 32.9 30.0 - 36.0 g/dL   RDW 40.9 81.1 - 91.4 %   Platelets 281 150 - 400 K/uL   nRBC 0.0 0.0 - 0.2 %  Basic metabolic panel     Status: Abnormal   Collection Time: 01/18/18  5:36 AM  Result Value Ref Range   Sodium 139 135 - 145 mmol/L   Potassium 4.8 3.5 - 5.1 mmol/L   Chloride 105 98 - 111 mmol/L   CO2 29 22 - 32 mmol/L   Glucose, Bld 108 (H) 70 - 99 mg/dL   BUN 20 8 - 23 mg/dL   Creatinine, Ser 7.82 0.61 - 1.24 mg/dL   Calcium 8.3 (L) 8.9 - 10.3 mg/dL   GFR calc non Af Amer >60 >60 mL/min   GFR calc Af Amer >60 >60 mL/min   Anion gap 5 5 - 15    Dg Tibia/fibula Left  Result Date: 01/16/2018 CLINICAL DATA:  ORIF tibial fracture. EXAM: LEFT TIBIA AND FIBULA - 2 VIEW COMPARISON:  01/14/2018 radiographs FINDINGS: Intraoperative spot views of the proximal tibia are submitted postoperatively for interpretation. Internal plate and screw fixation noted traversing a  proximal tibial fracture, in near-anatomic alignment and position. No definite hardware complicating features noted. A minimally displaced fibular fracture is unchanged. IMPRESSION: ORIF proximal tibial fracture, in near-anatomic alignment and position. Electronically Signed   By: Harmon Pier M.D.   On: 01/16/2018 12:45   Dg Knee Left Port  Result Date: 01/16/2018 CLINICAL DATA:  ORIF proximal tibial fracture. EXAM: PORTABLE LEFT KNEE - 1-2 VIEW COMPARISON:  Prior studies FINDINGS: Internal plate screw fixation noted traversing a proximal tibial fracture, in near-anatomic alignment and position. A mildly displaced proximal fibular fracture is identified. No complicating features are identified. IMPRESSION: ORIF proximal tibial fracture, in near-anatomic alignment and position. Electronically Signed   By: Harmon Pier M.D.   On: 01/16/2018 14:16   Dg C-arm 1-60 Min-no Report  Result Date: 01/16/2018 Fluoroscopy was utilized by the requesting physician.  No radiographic interpretation.    Assessment/Plan: 2 Days Post-Op   Active Problems:   Tibia fracture  Patient does not want to go to SNF.  He is being discharged home with home health.  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  He is TTWB on the left lower extremity.  He will follow up with me in 7-10 days in the office and should remain on lovenox for DVT prophylaxis until then.  Patient was instructed to take colace and dulcolax while on narcotics.    Juanell Fairly , MD 01/18/2018, 11:38 AM

## 2018-01-18 NOTE — Care Management (Signed)
EMS packet complete. Wheelchair and bedside commode will be delivered to patient's home today per [place] with [facility]. Brad updated on patient's discharge to home today and home health order. [facility] advised to go to patient's back door per patient's request. No other RNCM needs.

## 2018-01-19 NOTE — Op Note (Signed)
01/16/2018  4:53 PM  PATIENT:  Alejandro Cook    PRE-OPERATIVE DIAGNOSIS:  Left proximal tibia fracture  POST-OPERATIVE DIAGNOSIS:  Same  PROCEDURE:   OPEN REDUCTION INTERNAL FIXATION (ORIF) LEFT TIBIA FRACTURE  SURGEON:  Juanell Fairly, MD  ANESTHESIA:   General  PREOPERATIVE INDICATIONS:  [name] is a  67 y.o. male with a diagnosis of tibia fracture.    I discussed the risks and benefits of surgery with the patient and his son the day before the procedure. The risks include but are not limited to infection, bleeding requiring blood transfusion, nerve or blood vessel injury, joint stiffness or loss of motion, persistent pain, weakness or instability, malunion, nonunion and hardware failure and the need for further surgery. Medical risks include but are not limited to DVT and pulmonary embolism, myocardial infarction, stroke, pneumonia, respiratory failure and death. Patient understood these risks and wished to proceed.    OPERATIVE IMPLANTS: [employer] Proximal tibial plate.  OPERATIVE FINDINGS: Unstable proximal tibia fracture  OPERATIVE PROCEDURE: Patient was brought to the operating room.  He was placed supine on the operative table.  Patient underwent general anesthesia.  Stability of the fracture was tested under C arm imaging and deemed to be too unstable to allow for cast treatment.  The decision was made to perform an open reduction internal fixation of his left tibia fracture.  A timeout was performed to verify the patient's name, date of birth, medical record number and correct site of surgery.  He was also used to confirm the patient received antibiotics all appropriate instruments, implants and radiographic studies were available in the room.  His left lower extremity was prepped and draped in a sterile fashion.  An L-shaped incision was made over the anterior tibia extending over the lateral joint line.  Full-thickness skin flaps were developed.  The anterior  compartment fascia was incised to allow for exposure of the proximal tibial fracture.  Fracture was then closed reduced.  A lateral Biomet Alps plate was placed over the lateral proximal tibia and provisionally held into position with K wires.  The fracture reduction was held in place with a large fracture reduction clamp.  A small stab incision was made over the medial proximal tibia to allow for approximation of the clamp with the medial tibia.  C-arm images were used to confirm placement of the plate as well as fracture reduction.  A single bicortical screw was then placed through the shaft to reduce the plate to the lateralshaft of the tibia.  Then the raft screws were then drilled and placed for proximal fixation of the fracture.  Their position was confirmed on C-arm imaging.  Once the raft screws were well-positioned the remaining bicortical screws in the tibial shaft were placed along with a kickstand screw.  Final images of the fracture reduction and plate placement were taken using C arm.  The wound was copiously irrigated.  The wound was closed using 0 Vicryl, 2-0 Vicryl and skin staples.  A dry sterile dressing was applied.  The patient was placed in a knee immobilizer.  He was awoken and brought to the PACU in stable condition.  I was scrubbed and present for the entire case.  Sharp and instrument counts were correct at the conclusion the case.  Spoke with the patient's son by phone to let him know the case was performed without complication and his father was stable in the recovery room.

## 2018-01-23 ENCOUNTER — Other Ambulatory Visit: Payer: Self-pay

## 2018-01-23 ENCOUNTER — Inpatient Hospital Stay
Admission: EM | Admit: 2018-01-23 | Discharge: 2018-01-31 | DRG: 389 | Disposition: A | Discharge status: Skilled Nursing Facility | Payer: Medicare PPO | Attending: Specialist | Admitting: Specialist

## 2018-01-23 ENCOUNTER — Emergency Department: Payer: Medicare PPO

## 2018-01-23 ENCOUNTER — Encounter: Payer: Self-pay | Admitting: Emergency Medicine

## 2018-01-23 DIAGNOSIS — M1712 Unilateral primary osteoarthritis, left knee: Secondary | ICD-10-CM | POA: Diagnosis present

## 2018-01-23 DIAGNOSIS — M879 Osteonecrosis, unspecified: Secondary | ICD-10-CM | POA: Diagnosis present

## 2018-01-23 DIAGNOSIS — Z7901 Long term (current) use of anticoagulants: Secondary | ICD-10-CM | POA: Diagnosis not present

## 2018-01-23 DIAGNOSIS — Z79899 Other long term (current) drug therapy: Secondary | ICD-10-CM | POA: Diagnosis not present

## 2018-01-23 DIAGNOSIS — G8918 Other acute postprocedural pain: Secondary | ICD-10-CM

## 2018-01-23 DIAGNOSIS — I10 Essential (primary) hypertension: Secondary | ICD-10-CM | POA: Diagnosis present

## 2018-01-23 DIAGNOSIS — K9189 Other postprocedural complications and disorders of digestive system: Secondary | ICD-10-CM | POA: Diagnosis present

## 2018-01-23 DIAGNOSIS — F17210 Nicotine dependence, cigarettes, uncomplicated: Secondary | ICD-10-CM | POA: Diagnosis present

## 2018-01-23 DIAGNOSIS — R066 Hiccough: Secondary | ICD-10-CM | POA: Diagnosis present

## 2018-01-23 DIAGNOSIS — K567 Ileus, unspecified: Secondary | ICD-10-CM | POA: Diagnosis not present

## 2018-01-23 DIAGNOSIS — F329 Major depressive disorder, single episode, unspecified: Secondary | ICD-10-CM | POA: Diagnosis present

## 2018-01-23 DIAGNOSIS — T402X5A Adverse effect of other opioids, initial encounter: Secondary | ICD-10-CM | POA: Diagnosis present

## 2018-01-23 DIAGNOSIS — K439 Ventral hernia without obstruction or gangrene: Secondary | ICD-10-CM

## 2018-01-23 DIAGNOSIS — K56609 Unspecified intestinal obstruction, unspecified as to partial versus complete obstruction: Secondary | ICD-10-CM

## 2018-01-23 DIAGNOSIS — K5981 Ogilvie syndrome: Secondary | ICD-10-CM

## 2018-01-23 DIAGNOSIS — K5669 Other partial intestinal obstruction: Secondary | ICD-10-CM | POA: Diagnosis not present

## 2018-01-23 DIAGNOSIS — K5903 Drug induced constipation: Secondary | ICD-10-CM | POA: Diagnosis present

## 2018-01-23 DIAGNOSIS — K598 Other specified functional intestinal disorders: Secondary | ICD-10-CM | POA: Diagnosis not present

## 2018-01-23 DIAGNOSIS — R1084 Generalized abdominal pain: Secondary | ICD-10-CM | POA: Diagnosis not present

## 2018-01-23 DIAGNOSIS — Z23 Encounter for immunization: Secondary | ICD-10-CM | POA: Diagnosis present

## 2018-01-23 DIAGNOSIS — R109 Unspecified abdominal pain: Secondary | ICD-10-CM | POA: Diagnosis present

## 2018-01-23 DIAGNOSIS — K4001 Bilateral inguinal hernia, with obstruction, without gangrene, recurrent: Secondary | ICD-10-CM | POA: Diagnosis present

## 2018-01-23 DIAGNOSIS — R140 Abdominal distension (gaseous): Secondary | ICD-10-CM | POA: Diagnosis not present

## 2018-01-23 DIAGNOSIS — E785 Hyperlipidemia, unspecified: Secondary | ICD-10-CM | POA: Diagnosis present

## 2018-01-23 DIAGNOSIS — E860 Dehydration: Secondary | ICD-10-CM | POA: Diagnosis present

## 2018-01-23 LAB — COMPREHENSIVE METABOLIC PANEL
ALK PHOS: 78 U/L (ref 38–126)
ALT: 21 U/L (ref 0–44)
AST: 18 U/L (ref 15–41)
Albumin: 3.4 g/dL — ABNORMAL LOW (ref 3.5–5.0)
Anion gap: 10 (ref 5–15)
BUN: 24 mg/dL — ABNORMAL HIGH (ref 8–23)
CALCIUM: 9.1 mg/dL (ref 8.9–10.3)
CHLORIDE: 100 mmol/L (ref 98–111)
CO2: 25 mmol/L (ref 22–32)
CREATININE: 0.8 mg/dL (ref 0.61–1.24)
Glucose, Bld: 107 mg/dL — ABNORMAL HIGH (ref 70–99)
Potassium: 3.6 mmol/L (ref 3.5–5.1)
Sodium: 135 mmol/L (ref 135–145)
TOTAL PROTEIN: 6.8 g/dL (ref 6.5–8.1)
Total Bilirubin: 0.8 mg/dL (ref 0.3–1.2)

## 2018-01-23 LAB — CBC WITH DIFFERENTIAL/PLATELET
ABS IMMATURE GRANULOCYTES: 0.05 10*3/uL (ref 0.00–0.07)
BASOS PCT: 0 %
Basophils Absolute: 0.1 10*3/uL (ref 0.0–0.1)
Eosinophils Absolute: 0 10*3/uL (ref 0.0–0.5)
Eosinophils Relative: 0 %
HCT: 33.1 % — ABNORMAL LOW (ref 39.0–52.0)
Hemoglobin: 10.9 g/dL — ABNORMAL LOW (ref 13.0–17.0)
Immature Granulocytes: 0 %
Lymphocytes Relative: 15 %
Lymphs Abs: 2 10*3/uL (ref 0.7–4.0)
MCH: 31.7 pg (ref 26.0–34.0)
MCHC: 32.9 g/dL (ref 30.0–36.0)
MCV: 96.2 fL (ref 80.0–100.0)
Monocytes Absolute: 1.5 10*3/uL — ABNORMAL HIGH (ref 0.1–1.0)
Monocytes Relative: 11 %
Neutro Abs: 10.1 10*3/uL — ABNORMAL HIGH (ref 1.7–7.7)
Neutrophils Relative %: 74 %
PLATELETS: 519 10*3/uL — AB (ref 150–400)
RBC: 3.44 MIL/uL — ABNORMAL LOW (ref 4.22–5.81)
RDW: 13.5 % (ref 11.5–15.5)
WBC: 13.7 10*3/uL — ABNORMAL HIGH (ref 4.0–10.5)
nRBC: 0 % (ref 0.0–0.2)

## 2018-01-23 MED ORDER — FENTANYL CITRATE (PF) 100 MCG/2ML IJ SOLN
100.0000 ug | INTRAMUSCULAR | Status: DC | PRN
  Administered 2018-01-23: 100 ug via INTRAVENOUS
  Filled 2018-01-23: qty 2

## 2018-01-23 MED ORDER — BUPROPION HCL ER (SR) 150 MG PO TB12
150.0000 mg | ORAL_TABLET | Freq: Two times a day (BID) | ORAL | Status: DC
  Administered 2018-01-24 – 2018-01-31 (×10): 150 mg via ORAL
  Filled 2018-01-23 (×17): qty 1

## 2018-01-23 MED ORDER — IOPAMIDOL (ISOVUE-300) INJECTION 61%
100.0000 mL | Freq: Once | INTRAVENOUS | Status: AC | PRN
  Administered 2018-01-23: 100 mL via INTRAVENOUS

## 2018-01-23 MED ORDER — PROMETHAZINE HCL 25 MG/ML IJ SOLN
12.5000 mg | Freq: Four times a day (QID) | INTRAMUSCULAR | Status: DC | PRN
  Administered 2018-01-23 – 2018-01-24 (×2): 12.5 mg via INTRAVENOUS
  Filled 2018-01-23: qty 1

## 2018-01-23 MED ORDER — MORPHINE SULFATE (PF) 4 MG/ML IV SOLN
INTRAVENOUS | Status: AC
  Administered 2018-01-23: 4 mg
  Filled 2018-01-23: qty 1

## 2018-01-23 MED ORDER — KETAMINE HCL 10 MG/ML IJ SOLN
INTRAMUSCULAR | Status: AC
  Administered 2018-01-23: 18 mg via INTRAVENOUS
  Filled 2018-01-23: qty 1

## 2018-01-23 MED ORDER — NICOTINE 21 MG/24HR TD PT24
21.0000 mg | MEDICATED_PATCH | Freq: Once | TRANSDERMAL | Status: AC
  Administered 2018-01-23: 21 mg via TRANSDERMAL

## 2018-01-23 MED ORDER — SODIUM CHLORIDE 0.9 % IV SOLN
Freq: Once | INTRAVENOUS | Status: AC
  Administered 2018-01-23: 1000 mL via INTRAVENOUS

## 2018-01-23 MED ORDER — OXYCODONE HCL 5 MG PO TABS: 10.0000 mg | ORAL_TABLET | ORAL | Status: DC | PRN

## 2018-01-23 MED ORDER — SODIUM CHLORIDE 0.9 % IV SOLN
INTRAVENOUS | Status: DC
  Administered 2018-01-23 – 2018-01-24 (×2): via INTRAVENOUS

## 2018-01-23 MED ORDER — DOCUSATE SODIUM 100 MG PO CAPS
100.0000 mg | ORAL_CAPSULE | Freq: Two times a day (BID) | ORAL | Status: DC | PRN
  Administered 2018-01-24: 100 mg via ORAL
  Filled 2018-01-23: qty 1

## 2018-01-23 MED ORDER — ACETAMINOPHEN 10 MG/ML IV SOLN
1000.0000 mg | Freq: Four times a day (QID) | INTRAVENOUS | Status: AC
  Administered 2018-01-23 – 2018-01-24 (×3): 1000 mg via INTRAVENOUS
  Filled 2018-01-23 (×4): qty 100

## 2018-01-23 MED ORDER — BISACODYL 10 MG RE SUPP: 10.0000 mg | Freq: Every day | RECTAL | Status: DC | PRN

## 2018-01-23 MED ORDER — LACTULOSE 10 GM/15ML PO SOLN
30.0000 g | Freq: Once | ORAL | Status: AC
  Administered 2018-01-23: 30 g via ORAL
  Filled 2018-01-23: qty 60

## 2018-01-23 MED ORDER — MORPHINE SULFATE (PF) 2 MG/ML IV SOLN
2.0000 mg | INTRAVENOUS | Status: DC | PRN
  Administered 2018-01-24: 2 mg via INTRAVENOUS
  Filled 2018-01-23: qty 1

## 2018-01-23 MED ORDER — DICLOFENAC SODIUM 75 MG PO TBEC
75.0000 mg | DELAYED_RELEASE_TABLET | Freq: Two times a day (BID) | ORAL | Status: DC
  Administered 2018-01-24 – 2018-01-31 (×14): 75 mg via ORAL
  Filled 2018-01-23 (×17): qty 1

## 2018-01-23 MED ORDER — DULOXETINE HCL 30 MG PO CPEP
60.0000 mg | ORAL_CAPSULE | Freq: Every day | ORAL | Status: DC
  Administered 2018-01-24 – 2018-01-31 (×8): 60 mg via ORAL
  Filled 2018-01-23 (×8): qty 2

## 2018-01-23 MED ORDER — ONDANSETRON HCL 4 MG/2ML IJ SOLN: 4.0000 mg | Freq: Four times a day (QID) | INTRAMUSCULAR | Status: DC | PRN

## 2018-01-23 MED ORDER — MORPHINE SULFATE (PF) 4 MG/ML IV SOLN
4.0000 mg | INTRAVENOUS | Status: DC | PRN
  Administered 2018-01-23: 4 mg via INTRAVENOUS
  Filled 2018-01-23: qty 1

## 2018-01-23 MED ORDER — METHOCARBAMOL 500 MG PO TABS
500.0000 mg | ORAL_TABLET | Freq: Four times a day (QID) | ORAL | Status: DC | PRN
  Administered 2018-01-25 – 2018-01-31 (×6): 500 mg via ORAL
  Filled 2018-01-23 (×7): qty 1

## 2018-01-23 MED ORDER — NICOTINE 21 MG/24HR TD PT24
MEDICATED_PATCH | TRANSDERMAL | Status: AC
  Administered 2018-01-23: 21 mg via TRANSDERMAL
  Filled 2018-01-23: qty 1

## 2018-01-23 MED ORDER — PROMETHAZINE HCL 25 MG/ML IJ SOLN
INTRAMUSCULAR | Status: AC
  Filled 2018-01-23: qty 1

## 2018-01-23 MED ORDER — PRAVASTATIN SODIUM 20 MG PO TABS
40.0000 mg | ORAL_TABLET | Freq: Every day | ORAL | Status: DC
  Administered 2018-01-24 – 2018-01-30 (×7): 40 mg via ORAL
  Filled 2018-01-23 (×7): qty 2

## 2018-01-23 MED ORDER — ENOXAPARIN SODIUM 40 MG/0.4ML ~~LOC~~ SOLN
40.0000 mg | SUBCUTANEOUS | Status: DC
  Administered 2018-01-23 – 2018-01-30 (×8): 40 mg via SUBCUTANEOUS
  Filled 2018-01-23 (×8): qty 0.4

## 2018-01-23 MED ORDER — KETAMINE HCL 10 MG/ML IJ SOLN
0.2000 mg/kg | Freq: Once | INTRAMUSCULAR | Status: AC
  Administered 2018-01-23: 18 mg via INTRAVENOUS

## 2018-01-23 MED ORDER — METHYLNALTREXONE BROMIDE 12 MG/0.6ML ~~LOC~~ SOLN
12.0000 mg | Freq: Once | SUBCUTANEOUS | Status: DC
  Filled 2018-01-23: qty 0.6

## 2018-01-23 NOTE — ED Notes (Signed)
Patient transported to CT 

## 2018-01-23 NOTE — ED Provider Notes (Signed)
[facility] Emergency Department Provider Note    First MD Initiated Contact with Patient 01/23/18 1612     (approximate)  I have reviewed the triage vital signs and the nursing notes.   HISTORY  Chief Complaint Constipation and Abdominal Pain    HPI Alejandro Cook is a 67 y.o. male with recent admission for left leg proximal  presents the ER for diffuse crampy abdominal pain and not moving his bowels for nearly 1 week.  This is also noticed a decrease in passing gas.  States he was getting frustrated with being kept in the hospital until he can move his bowels so he lied and staff is that he had.  States that since leaving the hospital he has not had a normal bowel movement and not one in several days.  Denies any chest pain or shortness of breath.  Is also complaining of worsening left lower extremity swelling and pain.    Past Medical History:  Diagnosis Date  . Depression   . Hyperlipidemia   . Hypertension    Family History  Problem Relation Age of Onset  . Breast cancer Mother   . CVA Father    Past Surgical History:  Procedure Laterality Date  . CLOSED REDUCTION TIBIA Left 01/16/2018   Procedure: CLOSED REDUCTION TIBIA VS. ORIF TIBIA;  Surgeon: Juanell Fairly, MD;  Location: [facility] ORS;  Service: Orthopedics;  Laterality: Left;  . ORIF TIBIA FRACTURE Left 01/16/2018   Procedure: OPEN REDUCTION INTERNAL FIXATION (ORIF) TIBIA FRACTURE;  Surgeon: Juanell Fairly, MD;  Location: [facility] ORS;  Service: Orthopedics;  Laterality: Left;   Patient Active Problem List   Diagnosis Date Noted  . Tibia fracture 01/14/2018      Prior to Admission medications   Medication Sig Start Date End Date Taking? Authorizing Provider  buPROPion (WELLBUTRIN SR) 150 MG 12 hr tablet Take 1 tablet by mouth 2 (two) times daily. 11/08/17   [provider]  diclofenac (VOLTAREN) 75 MG EC tablet Take 1 tablet by mouth 2 (two) times daily. 11/20/17   [provider]  DULoxetine (CYMBALTA) 60 MG capsule Take 1 capsule by mouth daily. 12/19/17   [provider]  enoxaparin (LOVENOX) 40 MG/0.4ML injection Inject 0.4 mLs (40 mg total) into the skin daily for 14 days. 01/19/18 02/02/18  Houston Siren, MD  lisinopril-hydrochlorothiazide (PRINZIDE,ZESTORETIC) 20-12.5 MG tablet Take 1 tablet by mouth daily. 12/19/17   [provider]  lovastatin (MEVACOR) 40 MG tablet Take 1 tablet by mouth daily.    [provider]  methocarbamol (ROBAXIN) 500 MG tablet Take 1 tablet (500 mg total) by mouth every 6 (six) hours as needed for muscle spasms. 01/18/18   Houston Siren, MD  oxyCODONE 10 MG TABS Take 1 tablet (10 mg total) by mouth every 4 (four) hours as needed for moderate pain (pain score 4-6). 01/18/18   Houston Siren, MD    Allergies Patient has no known allergies.    Social History Social History   Tobacco Use  . Smoking status: Current Every Day Smoker    Types: Cigarettes  . Smokeless tobacco: Never Used  Substance Use Topics  . Alcohol use: Never    Frequency: Never  . Drug use: Yes    Types: Marijuana    Review of Systems Patient denies headaches, rhinorrhea, blurry vision, numbness, shortness of breath, chest pain, edema, cough, abdominal pain, nausea, vomiting, diarrhea, dysuria, fevers, rashes or hallucinations unless otherwise stated above in  HPI. ____________________________________________   PHYSICAL EXAM:  VITAL SIGNS: Vitals:   01/23/18 1621  BP: 102/80  Pulse: 90  Resp: 20  Temp: 98.2 F (36.8 C)  SpO2: 97%    Constitutional: Alert and oriented.  Eyes: Conjunctivae are normal.  Head: Atraumatic. Nose: No congestion/rhinnorhea. Mouth/Throat: Mucous membranes are moist.   Neck: No stridor. Painless ROM.  Cardiovascular: Normal rate, regular rhythm. Grossly normal heart sounds.  Good peripheral circulation. Respiratory: Normal respiratory effort.  No retractions. Lungs  CTAB. Gastrointestinal: Soft distended and ttp. No distention. No abdominal bruits. No CVA tenderness. Genitourinary: deferred Musculoskeletal: lle with ecchymosis and edema. No cellulitic changes.  No joint effusions. Neurologic:  Normal speech and language. No gross focal neurologic deficits are appreciated. No facial droop Skin:  Skin is warm, dry and intact. No rash noted. Psychiatric: Mood and affect are normal. Speech and behavior are normal.  ____________________________________________   LABS (all labs ordered are listed, but only abnormal results are displayed)  Results for orders placed or performed during the hospital encounter of 01/23/18 (from the past 24 hour(s))  CBC with Differential/Platelet     Status: Abnormal   Collection Time: 01/23/18  5:02 PM  Result Value Ref Range   WBC 13.7 (H) 4.0 - 10.5 K/uL   RBC 3.44 (L) 4.22 - 5.81 MIL/uL   Hemoglobin 10.9 (L) 13.0 - 17.0 g/dL   HCT 29.5 (L) 28.4 - 13.2 %   MCV 96.2 80.0 - 100.0 fL   MCH 31.7 26.0 - 34.0 pg   MCHC 32.9 30.0 - 36.0 g/dL   RDW 44.0 10.2 - 72.5 %   Platelets 519 (H) 150 - 400 K/uL   nRBC 0.0 0.0 - 0.2 %   Neutrophils Relative % 74 %   Neutro Abs 10.1 (H) 1.7 - 7.7 K/uL   Lymphocytes Relative 15 %   Lymphs Abs 2.0 0.7 - 4.0 K/uL   Monocytes Relative 11 %   Monocytes Absolute 1.5 (H) 0.1 - 1.0 K/uL   Eosinophils Relative 0 %   Eosinophils Absolute 0.0 0.0 - 0.5 K/uL   Basophils Relative 0 %   Basophils Absolute 0.1 0.0 - 0.1 K/uL   Immature Granulocytes 0 %   Abs Immature Granulocytes 0.05 0.00 - 0.07 K/uL  Comprehensive metabolic panel     Status: Abnormal   Collection Time: 01/23/18  5:02 PM  Result Value Ref Range   Sodium 135 135 - 145 mmol/L   Potassium 3.6 3.5 - 5.1 mmol/L   Chloride 100 98 - 111 mmol/L   CO2 25 22 - 32 mmol/L   Glucose, Bld 107 (H) 70 - 99 mg/dL   BUN 24 (H) 8 - 23 mg/dL   Creatinine, Ser 3.66 0.61 - 1.24 mg/dL   Calcium 9.1 8.9 - 44.0 mg/dL   Total Protein 6.8 6.5 -  8.1 g/dL   Albumin 3.4 (L) 3.5 - 5.0 g/dL   AST 18 15 - 41 U/L   ALT 21 0 - 44 U/L   Alkaline Phosphatase 78 38 - 126 U/L   Total Bilirubin 0.8 0.3 - 1.2 mg/dL   GFR calc non Af Amer >60 >60 mL/min   GFR calc Af Amer >60 >60 mL/min   Anion gap 10 5 - 15   ____________________________________________ ____________________________________________  RADIOLOGY  I personally reviewed all radiographic images ordered to evaluate for the above acute complaints and reviewed radiology reports and findings.  These findings were personally discussed with the patient.  Please see medical record  for radiology report.  ____________________________________________   PROCEDURES  Procedure(s) performed:  Procedures    Critical Care performed: no ____________________________________________   INITIAL IMPRESSION / ASSESSMENT AND PLAN / ED COURSE  Pertinent labs & imaging results that were available during my care of the patient were reviewed by me and considered in my medical decision making (see chart for details).   DDX: SBO, constipation, obstipation, impaction, diverticulitis, DVT, postop pain  SHAFT CORIGLIANO is a 67 y.o. who presents to the ED with abdominal pain and distention left leg pain as described above.  Patient afebrile but in obvious discomfort.  The patient will be placed on continuous pulse oximetry and telemetry for monitoring.  Laboratory evaluation will be sent to evaluate for the above complaints.     Clinical Course as of Jan 24 2007  Tue Jan 23, 2018  1743 Post wounds appear well healing.   [PR]  1922 Reduction of left inguinal hernia was incomplete.  Was able to reduce from fairly large size down to about the size of a small orange but the final herniated portion was unable to be reduced.  I discussed the case with Dr. Elenor Legato of general surgery.  Given the size of the hernia unlikely to be amenable to manual reduction but there is no evidence of true obstructive pathology  based on CT imaging.   [PR]  1947 We will continue with IV pain medication as well as IV fluids as the patient is not tolerating oral hydration at this point.   [PR]    Clinical Course User Index [PR] Willy Eddy, MD     As part of my medical decision making, I reviewed the following data within the electronic MEDICAL RECORD NUMBER Nursing notes reviewed and incorporated, Labs reviewed, notes from prior ED visits.   ____________________________________________   FINAL CLINICAL IMPRESSION(S) / ED DIAGNOSES  Final diagnoses:  Other partial intestinal obstruction (HCC)  Generalized abdominal pain  Dehydration  Post-op pain      NEW MEDICATIONS STARTED DURING THIS VISIT:  New Prescriptions   No medications on file     Note:  This document was prepared using Dragon voice recognition software and may include unintentional dictation errors.    Willy Eddy, MD 01/23/18 2008

## 2018-01-23 NOTE — ED Notes (Signed)
PT reports medications have made him feel "weird" and have lessoned the number of muscle cramps but reports when he has the cramps they are still 9/10 pain.

## 2018-01-23 NOTE — ED Notes (Signed)
Knee immobilizer reapplied

## 2018-01-23 NOTE — ED Notes (Signed)
Pt given ginger ale.

## 2018-01-23 NOTE — H&P (Signed)
[employer] [facility] [place] at [facility]   PATIENT NAME: Alejandro Cook    MR#:  [id number]  DATE OF BIRTH:  1950/07/18  DATE OF ADMISSION:  01/23/2018  PRIMARY CARE PHYSICIAN: Leanna Sato, MD   REQUESTING/REFERRING PHYSICIAN: Roxan Hockey  CHIEF COMPLAINT:   Chief Complaint  Patient presents with  . Constipation  . Abdominal Pain    HISTORY OF PRESENT ILLNESS: Alejandro Cook  is a 67 y.o. male with a known history of hyperlipidemia, hypertension, depression-was admitted to hospital with left tibia-fibula fracture a week ago where surgery was done and he was sent home with home health. He could not walk much because of significant pain and now also having pain in his abdomen and not having bowel movement so came back to emergency room. ER physician did CT scan on the abdomen which showed bowel obstruction and also some inguinal hernia and call surgical consult. Surgery physician suggested conservative management for now and no surgery for his hernia and suggested to admit to medical services.  PAST MEDICAL HISTORY:   Past Medical History:  Diagnosis Date  . Depression   . Hyperlipidemia   . Hypertension     PAST SURGICAL HISTORY:  Past Surgical History:  Procedure Laterality Date  . CLOSED REDUCTION TIBIA Left 01/16/2018   Procedure: CLOSED REDUCTION TIBIA VS. ORIF TIBIA;  Surgeon: Juanell Fairly, MD;  Location: [facility] ORS;  Service: Orthopedics;  Laterality: Left;  . ORIF TIBIA FRACTURE Left 01/16/2018   Procedure: OPEN REDUCTION INTERNAL FIXATION (ORIF) TIBIA FRACTURE;  Surgeon: Juanell Fairly, MD;  Location: [facility] ORS;  Service: Orthopedics;  Laterality: Left;    SOCIAL HISTORY:  Social History   Tobacco Use  . Smoking status: Current Every Day Smoker    Types: Cigarettes  . Smokeless tobacco: Never Used  Substance Use Topics  . Alcohol use: Never    Frequency: Never    FAMILY HISTORY:  Family History  Problem Relation Age of Onset  . Breast cancer  Mother   . CVA Father     DRUG ALLERGIES: No Known Allergies  REVIEW OF SYSTEMS:   CONSTITUTIONAL: No fever, have fatigue or weakness.  EYES: No blurred or double vision.  EARS, NOSE, AND THROAT: No tinnitus or ear pain.  RESPIRATORY: No cough, shortness of breath, wheezing or hemoptysis.  CARDIOVASCULAR: No chest pain, orthopnea, edema.  GASTROINTESTINAL: No nausea, vomiting, diarrhea, have constipation and abdominal pain.  GENITOURINARY: No dysuria, hematuria.  ENDOCRINE: No polyuria, nocturia,  HEMATOLOGY: No anemia, easy bruising or bleeding SKIN: No rash or lesion. MUSCULOSKELETAL: No joint pain or arthritis.   NEUROLOGIC: No tingling, numbness, weakness.  PSYCHIATRY: No anxiety or depression.   MEDICATIONS AT HOME:  Prior to Admission medications   Medication Sig Start Date End Date Taking? Authorizing Provider  buPROPion (WELLBUTRIN SR) 150 MG 12 hr tablet Take 1 tablet by mouth 2 (two) times daily. 11/08/17   [provider]  diclofenac (VOLTAREN) 75 MG EC tablet Take 1 tablet by mouth 2 (two) times daily. 11/20/17   [provider]  DULoxetine (CYMBALTA) 60 MG capsule Take 1 capsule by mouth daily. 12/19/17   [provider]  enoxaparin (LOVENOX) 40 MG/0.4ML injection Inject 0.4 mLs (40 mg total) into the skin daily for 14 days. 01/19/18 02/02/18  Houston Siren, MD  lisinopril-hydrochlorothiazide (PRINZIDE,ZESTORETIC) 20-12.5 MG tablet Take 1 tablet by mouth daily. 12/19/17   [provider]  lovastatin (MEVACOR) 40 MG tablet Take 1 tablet by mouth daily.  [provider]  methocarbamol (ROBAXIN) 500 MG tablet Take 1 tablet (500 mg total) by mouth every 6 (six) hours as needed for muscle spasms. 01/18/18   Houston Siren, MD  oxyCODONE 10 MG TABS Take 1 tablet (10 mg total) by mouth every 4 (four) hours as needed for moderate pain (pain score 4-6). 01/18/18   Houston Siren, MD      PHYSICAL EXAMINATION:   VITAL SIGNS:  Blood pressure 131/77, pulse 93, temperature 98.9 F (37.2 C), temperature source Oral, resp. rate 18, height 5\' 11"  (1.803 m), weight 90.8 kg, SpO2 94 %.  GENERAL:  67 y.o.-year-old patient lying in the bed with no acute distress.  EYES: Pupils equal, round, reactive to light and accommodation. No scleral icterus. Extraocular muscles intact.  HEENT: Head atraumatic, normocephalic. Oropharynx and nasopharynx clear.  NECK:  Supple, no jugular venous distention. No thyroid enlargement, no tenderness.  LUNGS: Normal breath sounds bilaterally, no wheezing, rales,rhonchi or crepitation. No use of accessory muscles of respiration.  CARDIOVASCULAR: S1, S2 normal. No murmurs, rubs, or gallops.  ABDOMEN: Soft, nontender, distended. Bowel sounds sluggish. No organomegaly or mass.  EXTREMITIES: No pedal edema, cyanosis, or clubbing.  Have recent surgical stitches on his left leg with some ecchymosis and hematoma around the. NEUROLOGIC: Cranial nerves II through XII are intact. Muscle strength 4/5 in all extremities. Sensation intact. Gait not checked.  PSYCHIATRIC: The patient is alert and oriented x 3.  SKIN: No obvious rash, lesion, or ulcer.   LABORATORY PANEL:   CBC Recent Labs  Lab 01/17/18 0351 01/18/18 0536 01/23/18 1702  WBC 17.2* 13.4* 13.7*  HGB 10.5* 9.9* 10.9*  HCT 32.3* 30.1* 33.1*  PLT 243 281 519*  MCV 98.5 97.4 96.2  MCH 32.0 32.0 31.7  MCHC 32.5 32.9 32.9  RDW 13.2 13.7 13.5  LYMPHSABS  --   --  2.0  MONOABS  --   --  1.5*  EOSABS  --   --  0.0  BASOSABS  --   --  0.1   ------------------------------------------------------------------------------------------------------------------  Chemistries  Recent Labs  Lab 01/17/18 0351 01/18/18 0536 01/23/18 1702  NA 140 139 135  K 4.8 4.8 3.6  CL 105 105 100  CO2 27 29 25   GLUCOSE 120* 108* 107*  BUN 16 20 24*  CREATININE 0.91 0.91 0.80  CALCIUM 8.4* 8.3* 9.1  AST  --   --  18  ALT  --   --  21  ALKPHOS  --   --   78  BILITOT  --   --  0.8   ------------------------------------------------------------------------------------------------------------------ estimated creatinine clearance is 101.9 mL/min (by C-G formula based on SCr of 0.8 mg/dL). ------------------------------------------------------------------------------------------------------------------ No results for input(s): TSH, T4TOTAL, T3FREE, THYROIDAB in the last 72 hours.  Invalid input(s): FREET3   Coagulation profile No results for input(s): INR, PROTIME in the last 168 hours. ------------------------------------------------------------------------------------------------------------------- No results for input(s): DDIMER in the last 72 hours. -------------------------------------------------------------------------------------------------------------------  Cardiac Enzymes No results for input(s): CKMB, TROPONINI, MYOGLOBIN in the last 168 hours.  Invalid input(s): CK ------------------------------------------------------------------------------------------------------------------ Invalid input(s): POCBNP  ---------------------------------------------------------------------------------------------------------------  Urinalysis    Component Value Date/Time   COLORURINE AMBER (A) 01/15/2018 0304   APPEARANCEUR CLEAR (A) 01/15/2018 0304   APPEARANCEUR Turbid 04/17/2014 1601   LABSPEC 1.028 01/15/2018 0304   LABSPEC 1.029 04/17/2014 1601   PHURINE 5.0 01/15/2018 0304   GLUCOSEU NEGATIVE 01/15/2018 0304   GLUCOSEU Negative 04/17/2014 1601   HGBUR NEGATIVE 01/15/2018 0304   BILIRUBINUR NEGATIVE 01/15/2018  0304   BILIRUBINUR Negative 04/17/2014 1601   KETONESUR 5 (A) 01/15/2018 0304   PROTEINUR NEGATIVE 01/15/2018 0304   NITRITE NEGATIVE 01/15/2018 0304   LEUKOCYTESUR NEGATIVE 01/15/2018 0304   LEUKOCYTESUR Negative 04/17/2014 1601     RADIOLOGY: Dg Abdomen 1 View  Result Date: 01/23/2018 CLINICAL DATA:  Abdominal  pain and constipation. Surgery earlier this month. EXAM: ABDOMEN - 1 VIEW COMPARISON:  Pelvic radiograph 01/14/2018 FINDINGS: There is moderate diffuse gaseous distension of the colon. Colon is again noted to extend inferior to the pelvis suggesting a large ventral hernia. A small to moderate amount of stool is present in the colon. No dilated small bowel loops are seen to suggest obstruction. Extensive atherosclerotic vascular calcifications are noted. There is severe left hip osteoarthrosis with deformity of the femoral head, unchanged. Diffuse lumbar disc degeneration and mild scoliosis are noted. IMPRESSION: 1. Moderate gaseous distension of the colon which may reflect ileus. 2. No evidence of small bowel obstruction. 3. Suspected large ventral hernia. Electronically Signed   By: Sebastian Ache M.D.   On: 01/23/2018 16:42   Ct Abdomen Pelvis W Contrast  Result Date: 01/23/2018 CLINICAL DATA:  Abdominal pain and constipation. No bowel movement since left tibia ORIF on 01/16/2018. EXAM: CT ABDOMEN AND PELVIS WITH CONTRAST TECHNIQUE: Multidetector CT imaging of the abdomen and pelvis was performed using the standard protocol following bolus administration of intravenous contrast. CONTRAST:  ISOVUE-300 IOPAMIDOL (ISOVUE-300) INJECTION 61% COMPARISON:  Abdominal x-ray from same day. FINDINGS: Lower chest: No acute abnormality.  Scarring in the left lower lobe. Hepatobiliary: No focal liver abnormality. Cholelithiasis. No gallbladder wall thickening or biliary dilatation. Pancreas: Unremarkable. No pancreatic ductal dilatation or surrounding inflammatory changes. Spleen: Normal in size without focal abnormality. Adrenals/Urinary Tract: 2.4 cm right adrenal nodule. 1.2 cm left adrenal nodule with focus of rim calcification. The left adrenal gland is unremarkable. Small right renal cysts. No renal or ureteral calculi. No hydronephrosis. The bladder is unremarkable. Stomach/Bowel: The colon is mildly distended  to the level of a moderate left inguinal hernia which contains nondilated distal descending and proximal sigmoid colon. Large right inguinal hernia containing prominently stool-filled cecum, as well as the terminal ileum and normal appearing appendix. The stomach and small bowel are unremarkable. Vascular/Lymphatic: Extensive aortoiliac atherosclerosis. No enlarged abdominal or pelvic lymph nodes. Reproductive: Prostate is unremarkable. Other: Small amount of free fluid along the inferior liver. No pneumoperitoneum. Musculoskeletal: No acute or significant osseous findings. Severe end-stage left hip osteoarthritis with bony remodeling of the femoral head and acetabulum. Severe lumbar spondylosis. IMPRESSION: 1. Mildly dilated descending and right colon with transition point seen as the distal descending colon enters a moderate left inguinal hernia. There is stool in the downstream sigmoid colon and rectum, suggesting only partial obstruction. 2. Large right inguinal hernia containing distended, stool-filled cecum, as well as the terminal ileum and a normal appendix. 3. Bilateral indeterminate adrenal nodules measuring up to 2.4 cm. Non-emergent adrenal protocol CT is recommended for further evaluation. This recommendation follows ACR consensus guidelines: Management of Incidental Adrenal Masses: A White Paper of the ACR Incidental Findings Committee. J Am Coll Radiol 2017;14:1038-1044. 4. Cholelithiasis. 5.  Aortic atherosclerosis (ICD10-I70.0). Electronically Signed   By: Obie Dredge M.D.   On: 01/23/2018 18:58   US Venous Img Lower Unilateral Left  Result Date: 01/23/2018 CLINICAL DATA:  Pain and swelling LEFT leg, prior surgery, question deep venous thrombosis EXAM: LEFT LOWER EXTREMITY VENOUS DOPPLER ULTRASOUND TECHNIQUE: Gray-scale sonography with graded compression, as well  as color Doppler and duplex ultrasound were performed to evaluate the lower extremity deep venous systems from the level of the  common femoral vein and including the common femoral, femoral, profunda femoral, popliteal and calf veins including the posterior tibial, peroneal and gastrocnemius veins when visible. The superficial great saphenous vein was also interrogated. Spectral Doppler was utilized to evaluate flow at rest and with distal augmentation maneuvers in the common femoral, femoral and popliteal veins. At some sites compression is suboptimal due to patient intolerance of compression. COMPARISON:  None FINDINGS: Contralateral Common Femoral Vein: Respiratory phasicity is normal and symmetric with the symptomatic side. No evidence of thrombus. Normal compressibility. Common Femoral Vein: No evidence of thrombus. Normal compressibility, respiratory phasicity and response to augmentation. Saphenofemoral Junction: No evidence of thrombus. Normal compressibility and flow on color Doppler imaging. Profunda Femoral Vein: No evidence of thrombus. Normal compressibility and flow on color Doppler imaging. Femoral Vein: No evidence of thrombus. Normal compressibility, respiratory phasicity and response to augmentation. Popliteal Vein: No evidence of thrombus. Normal compressibility, respiratory phasicity and response to augmentation. Calf Veins: Suboptimally visualized peroneal veins. Visualized posterior tibial veins appear patent and compressible. Superficial Great Saphenous Vein: No evidence of deep venous thrombosis in the LEFT lower extremity. Venous Reflux:  None. Other Findings:  None. IMPRESSION: No evidence of deep venous thrombosis in the LEFT lower extremity. Electronically Signed   By: Ulyses Southward M.D.   On: 01/23/2018 18:52    EKG: Orders placed or performed during the hospital encounter of 01/14/18  . ED EKG  . ED EKG    IMPRESSION AND PLAN:  *Bowel obstruction This is secondary to pain medication use and hernia We will give Dulcolax suppository and stool softeners. We will also give Relistor. Appreciated surgical  help, suggested to manage conservatively as per ER physician.  *Inguinal hernia No surgical intervention at this time per ER physician.  *Recent tibia and fibula fracture surgery Doppler studies are negative for DVT, patient is on Lovenox subcu continue that. Orthopedic consult for further management in the hospital.   *Hypertension Hold home medications.  *Hyperlipidemia Continue statin.  All the records are reviewed and case discussed with ED provider. Management plans discussed with the patient, family and they are in agreement.  CODE STATUS: full.    Code Status Orders  (From admission, onward)         Start     Ordered   01/23/18 2154  Full code  Continuous     01/23/18 2153        Code Status History    Date Active Date Inactive Code Status Order ID Comments User Context   01/14/2018 2050 01/18/2018 1917 Full Code [id number]  Ihor Austin, MD Inpatient    Advance Directive Documentation     Most Recent Value  Type of Advance Directive  Healthcare Power of Attorney, Living will  Pre-existing out of facility DNR order (yellow form or pink MOST form)  -  "MOST" Form in Place?  -       TOTAL TIME TAKING CARE OF THIS PATIENT:45 minutes.    Altamese Dilling M.D on 01/23/2018   Between 7am to 6pm - Pager - [phone]  After 6pm go to [email or web address] - password [email or web address]  [facility]  Office  [phone]  CC: Primary care physician; Leanna Sato, MD   Note: This dictation was prepared with Dragon dictation along with smaller phrase technology. Any transcriptional errors that result from this process are unintentional.

## 2018-01-23 NOTE — ED Triage Notes (Signed)
Pt to ED via [facility] with complaints of abd pain and constipation. He had surgery earlier this month, he was suppose to have a BM after the surgery and was upset they were not letting him go home so he lied to the staff and told them he had a BM so he could go home. He has not had a BM since. Pts abd is tender upon palpation.

## 2018-01-23 NOTE — ED Notes (Signed)
Report called and pt accepted to room 204. Floor RN requesting 10 minutes before pt is transferred. Pt remains stable at this time.

## 2018-01-24 ENCOUNTER — Inpatient Hospital Stay: Payer: Medicare PPO

## 2018-01-24 DIAGNOSIS — K5981 Ogilvie syndrome: Secondary | ICD-10-CM

## 2018-01-24 DIAGNOSIS — K598 Other specified functional intestinal disorders: Secondary | ICD-10-CM

## 2018-01-24 LAB — CBC
HCT: 30.4 % — ABNORMAL LOW (ref 39.0–52.0)
Hemoglobin: 10 g/dL — ABNORMAL LOW (ref 13.0–17.0)
MCH: 31.7 pg (ref 26.0–34.0)
MCHC: 32.9 g/dL (ref 30.0–36.0)
MCV: 96.5 fL (ref 80.0–100.0)
PLATELETS: 533 10*3/uL — AB (ref 150–400)
RBC: 3.15 MIL/uL — ABNORMAL LOW (ref 4.22–5.81)
RDW: 13.4 % (ref 11.5–15.5)
WBC: 15.2 10*3/uL — ABNORMAL HIGH (ref 4.0–10.5)
nRBC: 0 % (ref 0.0–0.2)

## 2018-01-24 LAB — BASIC METABOLIC PANEL
Anion gap: 6 (ref 5–15)
BUN: 18 mg/dL (ref 8–23)
CHLORIDE: 105 mmol/L (ref 98–111)
CO2: 25 mmol/L (ref 22–32)
CREATININE: 0.71 mg/dL (ref 0.61–1.24)
Calcium: 8.4 mg/dL — ABNORMAL LOW (ref 8.9–10.3)
GFR calc non Af Amer: >60 mL/min (ref >60)
GLUCOSE: 106 mg/dL — AB (ref 70–99)
Potassium: 3.3 mmol/L — ABNORMAL LOW (ref 3.5–5.1)
Sodium: 136 mmol/L (ref 135–145)

## 2018-01-24 MED ORDER — TRAZODONE HCL 50 MG PO TABS
50.0000 mg | ORAL_TABLET | Freq: Every evening | ORAL | Status: DC | PRN
  Administered 2018-01-24 – 2018-01-26 (×3): 50 mg via ORAL
  Filled 2018-01-24 (×3): qty 1

## 2018-01-24 MED ORDER — PROMETHAZINE HCL 25 MG PO TABS: 12.5000 mg | ORAL_TABLET | Freq: Four times a day (QID) | ORAL | Status: DC | PRN

## 2018-01-24 MED ORDER — LACTULOSE 10 GM/15ML PO SOLN
20.0000 g | Freq: Two times a day (BID) | ORAL | Status: DC
  Administered 2018-01-24 – 2018-01-31 (×14): 20 g via ORAL
  Filled 2018-01-24 (×15): qty 30

## 2018-01-24 MED ORDER — POTASSIUM CHLORIDE CRYS ER 20 MEQ PO TBCR
20.0000 meq | EXTENDED_RELEASE_TABLET | Freq: Once | ORAL | Status: AC
  Administered 2018-01-24: 20 meq via ORAL
  Filled 2018-01-24: qty 1

## 2018-01-24 MED ORDER — OXYCODONE HCL 5 MG PO TABS
5.0000 mg | ORAL_TABLET | Freq: Four times a day (QID) | ORAL | Status: DC | PRN
  Administered 2018-01-25 – 2018-01-29 (×5): 5 mg via ORAL
  Filled 2018-01-24 (×5): qty 1

## 2018-01-24 MED ORDER — NICOTINE 21 MG/24HR TD PT24
21.0000 mg | MEDICATED_PATCH | Freq: Every day | TRANSDERMAL | Status: DC
  Administered 2018-01-24 – 2018-01-31 (×8): 21 mg via TRANSDERMAL
  Filled 2018-01-24 (×8): qty 1

## 2018-01-24 MED ORDER — INFLUENZA VAC SPLIT QUAD 0.5 ML IM SUSY
0.5000 mL | PREFILLED_SYRINGE | INTRAMUSCULAR | Status: AC
  Administered 2018-01-25: 0.5 mL via INTRAMUSCULAR
  Filled 2018-01-24: qty 0.5

## 2018-01-24 MED ORDER — SENNOSIDES-DOCUSATE SODIUM 8.6-50 MG PO TABS
2.0000 | ORAL_TABLET | Freq: Every day | ORAL | Status: DC
  Administered 2018-01-24 – 2018-01-28 (×5): 2 via ORAL
  Filled 2018-01-24 (×5): qty 2

## 2018-01-24 MED ORDER — OXYCODONE HCL 5 MG PO TABS
10.0000 mg | ORAL_TABLET | Freq: Four times a day (QID) | ORAL | Status: DC | PRN
  Administered 2018-01-24 – 2018-01-31 (×6): 10 mg via ORAL
  Filled 2018-01-24 (×7): qty 2

## 2018-01-24 MED ORDER — LISINOPRIL 20 MG PO TABS
20.0000 mg | ORAL_TABLET | Freq: Every day | ORAL | Status: DC
  Administered 2018-01-24 – 2018-01-31 (×8): 20 mg via ORAL
  Filled 2018-01-24 (×8): qty 1

## 2018-01-24 NOTE — Evaluation (Signed)
Physical Therapy Evaluation Patient Details Name: Alejandro Cook MRN: [id number] DOB: 03/31/1951 Today's Date: 01/24/2018   History of Present Illness  Pt is a 67 y/o M s/p fall with resultant L tibial and fibular fx.  Pt is now s/p ORIF.  Pt was then d/c to home and returned due to bowel obstruction and inguinal hernia. 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     Clinical Impression  Pt admitted with above diagnosis. Pt currently with functional limitations due to the deficits listed below (see PT Problem List). Alejandro Cook was very pleasant and agreeable to therapy but was limited by pain and fatigue this session.  Since d/c from the hospital the pt has been unable to ambulate due to weakness and limited space and assistance in his home.  He demonstrated greater weakness since recent d/c.  Pt requires up to mod +2 assist for bed mobility and +2 assist for sit>stand transfers.  Pt was able to hop to the R at EOB with assist but is limited due to fatigue and pain.  Given pt's current mobility status, recommending SNF at d/c and pt is agreeable to this recommendation.   Pt will benefit from skilled PT to increase their independence and safety with mobility to allow discharge to the venue listed below.      Follow Up Recommendations SNF    Equipment Recommendations  None recommended by PT    Recommendations for Other Services       Precautions / Restrictions Precautions Precautions: Fall Required Braces or Orthoses: Knee Immobilizer - Left Knee Immobilizer - Left: On at all times Restrictions Weight Bearing Restrictions: Yes LLE Weight Bearing: Touchdown weight bearing      Mobility  Bed Mobility Overal bed mobility: Needs Assistance Bed Mobility: Supine to Sit;Sit to Supine     Supine to sit: Min assist;+2 for physical assistance;HOB elevated Sit to supine: Mod assist;+2 for physical assistance   General bed mobility comments: Assist to elevate  trunk  Transfers Overall transfer level: Needs assistance Equipment used: Rolling walker (2 wheeled) Transfers: Sit to/from Stand Sit to Stand: Max assist;+2 physical assistance;From elevated surface         General transfer comment: Pt stood from elevated bed x3.  On each attempt pt required less assist.  Initially, required max +2 assist and on last trial required min +2 assist.   Ambulation/Gait Ambulation/Gait assistance: Mod assist;+2 physical assistance Gait Distance (Feet): 3 Feet Assistive device: Rolling walker (2 wheeled) Gait Pattern/deviations: (hop on RLE) Gait velocity: decreased   General Gait Details: After each sit>stand the pt practiced hopping in place the first 2 trials and to the R on the last trial.  Pt required assist to remain steady and to manage RW.   Stairs            Wheelchair Mobility    Modified Rankin (Stroke Patients Only)       Balance Overall balance assessment: Needs assistance;History of Falls Sitting-balance support: Single extremity supported;Feet supported Sitting balance-Leahy Scale: Poor Sitting balance - Comments: Pt relies on at least 1UE support while sitting EOB   Standing balance support: Bilateral upper extremity supported;During functional activity Standing balance-Leahy Scale: Poor Standing balance comment: Pt relies on BUE support for static and dynamic activities                             Pertinent Vitals/Pain Pain Assessment:  Faces Faces Pain Scale: Hurts whole lot Pain Location: LLE and abdomen(pain greater in LLE) Pain Descriptors / Indicators: Aching;Grimacing;Guarding Pain Intervention(s): Limited activity within patient's tolerance;Monitored during session    Home Living Family/patient expects to be discharged to:: Skilled nursing facility Living Arrangements: Alone Available Help at Discharge: Family;Available PRN/intermittently Type of Home: House Home Access: Stairs to enter    Entrance Stairs-Number of Steps: 2 Home Layout: Able to live on main level with bedroom/bathroom Home Equipment: Walker - 2 wheels;Walker - standard;Bedside commode;Shower seat      Prior Function Level of Independence: Needs assistance   Gait / Transfers Assistance Needed: Pt has not been ambulating with RW since d/c from hospital due to difficulty and small space in home.  Prior to recent surgery: Pt ambulates household and community distances with 2WW.  ADL's / Homemaking Assistance Needed: Since d/c from the hospital the family has been providing meals.  Prior to surgery: Pt independent for cooking, feeding, sponge bath (due to fear of falling/inability to get LLE into tub). Friend helps with errands, laundry (unable to go down steps into basement with laundry room) and light house cleaning.        Hand Dominance        Extremity/Trunk Assessment   Upper Extremity Assessment Upper Extremity Assessment: Overall WFL for tasks assessed    Lower Extremity Assessment Lower Extremity Assessment: LLE deficits/detail LLE Deficits / Details: Strength grossly 2/5, limited by pain LLE: Unable to fully assess due to pain;Unable to fully assess due to immobilization       Communication   Communication: No difficulties  Cognition Arousal/Alertness: Awake/alert Behavior During Therapy: WFL for tasks assessed/performed Overall Cognitive Status: Within Functional Limits for tasks assessed                                        General Comments      Exercises General Exercises - Lower Extremity Ankle Circles/Pumps: AROM;Both;10 reps;Supine Hip ABduction/ADduction: AAROM;Both;10 reps;Supine Straight Leg Raises: AAROM;Both;10 reps;Supine   Assessment/Plan    PT Assessment Patient needs continued PT services  PT Problem List Decreased strength;Decreased range of motion;Decreased activity tolerance;Decreased balance;Decreased mobility;Decreased knowledge of use of  DME;Decreased safety awareness;Decreased knowledge of precautions;Pain       PT Treatment Interventions DME instruction;Gait training;Stair training;Functional mobility training;Therapeutic activities;Therapeutic exercise;Balance training;Neuromuscular re-education;Patient/family education;Wheelchair mobility training;Modalities    PT Goals (Current goals can be found in the Care Plan section)  Acute Rehab PT Goals Patient Stated Goal: to go to rehab PT Goal Formulation: With patient Time For Goal Achievement: 02/07/18 Potential to Achieve Goals: Fair    Frequency 7X/week   Barriers to discharge Inaccessible home environment;Decreased caregiver support Lives alone with steps to enter home    Co-evaluation               AM-PAC PT "6 Clicks" Daily Activity  Outcome Measure Difficulty turning over in bed (including adjusting bedclothes, sheets and blankets)?: Unable Difficulty moving from lying on back to sitting on the side of the bed? : Unable Difficulty sitting down on and standing up from a chair with arms (e.g., wheelchair, bedside commode, etc,.)?: Unable Help needed moving to and from a bed to chair (including a wheelchair)?: A Lot Help needed walking in hospital room?: Total Help needed climbing 3-5 steps with a railing? : Total 6 Click Score: 7    End of Session Equipment Utilized  During Treatment: Gait belt Activity Tolerance: Patient limited by fatigue;Patient limited by pain Patient left: in bed;with call bell/phone within reach;with bed alarm set Nurse Communication: Mobility status;Weight bearing status;Other (comment)(L KI to be worn at all times) PT Visit Diagnosis: Pain;Unsteadiness on feet (R26.81);Other abnormalities of gait and mobility (R26.89);Muscle weakness (generalized) (M62.81);History of falling (Z91.81);Difficulty in walking, not elsewhere classified (R26.2) Pain - Right/Left: Left Pain - part of body: Leg    Time: 9562-1308 PT Time Calculation  (min) (ACUTE ONLY): 33 min   Charges:   PT Evaluation $PT Eval Moderate Complexity: 1 Mod PT Treatments $Therapeutic Activity: 8-22 mins        Encarnacion Chu PT, DPT 01/24/2018, 10:41 AM

## 2018-01-24 NOTE — Consult Note (Signed)
ORTHOPAEDIC CONSULTATION  REQUESTING PHYSICIAN: Salary, Evelena Asa, MD  Chief Complaint: Status post left tibia fracture ORIF  HPI: Alejandro Cook is a 67 y.o. male who complains of left lower extremity pain.  Patient is admitted with abdominal pain and has radiographic findings of distended bowel.  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  Patient has had chronic pain in these areas.  Past Medical History:  Diagnosis Date  . Depression   . Hyperlipidemia   . Hypertension    Past Surgical History:  Procedure Laterality Date  . CLOSED REDUCTION TIBIA Left 01/16/2018   Procedure: CLOSED REDUCTION TIBIA VS. ORIF TIBIA;  Surgeon: Juanell Fairly, MD;  Location: [facility] ORS;  Service: Orthopedics;  Laterality: Left;  . ORIF TIBIA FRACTURE Left 01/16/2018   Procedure: OPEN REDUCTION INTERNAL FIXATION (ORIF) TIBIA FRACTURE;  Surgeon: Juanell Fairly, MD;  Location: [facility] ORS;  Service: Orthopedics;  Laterality: Left;   Social History   Socioeconomic History  . Marital status: Divorced    Spouse name: Not on file  . Number of children: Not on file  . Years of education: Not on file  . Highest education level: Not on file  Occupational History    Employer: DISABLED  Social Needs  . Financial resource strain: Not on file  . Food insecurity:    Worry: Not on file    Inability: Not on file  . Transportation needs:    Medical: Not on file    Non-medical: Not on file  Tobacco Use  . Smoking status: Current Every Day Smoker    Types: Cigarettes  . Smokeless tobacco: Never Used  Substance and Sexual Activity  . Alcohol use: Never    Frequency: Never  . Drug use: Yes    Types: Marijuana  . Sexual activity: Not Currently  Lifestyle  . Physical activity:    Days per week: Not  on file    Minutes per session: Not on file  . Stress: Not on file  Relationships  . Social connections:    Talks on phone: Not on file    Gets together: Not on file    Attends religious service: Not on file    Active member of club or organization: Not on file    Attends meetings of clubs or organizations: Not on file    Relationship status: Not on file  Other Topics Concern  . Not on file  Social History Narrative  . Not on file   Family History  Problem Relation Age of Onset  . Breast cancer Mother   . CVA Father    No Known Allergies Prior to Admission medications   Medication Sig Start Date End Date Taking? Authorizing Provider  buPROPion (WELLBUTRIN SR) 150 MG 12 hr tablet Take 1 tablet by mouth 2 (two) times daily. 11/08/17   [provider]  diclofenac (VOLTAREN) 75 MG EC tablet Take 1 tablet by mouth 2 (two) times daily. 11/20/17   [provider]  DULoxetine (CYMBALTA) 60 MG capsule Take 1 capsule by mouth daily. 12/19/17   [provider]  enoxaparin (LOVENOX) 40 MG/0.4ML injection Inject 0.4 mLs (40 mg total) into the skin daily for 14 days. 01/19/18 02/02/18  Houston Siren, MD  lisinopril-hydrochlorothiazide (PRINZIDE,ZESTORETIC) 20-12.5 MG tablet Take 1 tablet  by mouth daily. 12/19/17   [provider]  lovastatin (MEVACOR) 40 MG tablet Take 1 tablet by mouth daily.    [provider]  methocarbamol (ROBAXIN) 500 MG tablet Take 1 tablet (500 mg total) by mouth every 6 (six) hours as needed for muscle spasms. 01/18/18   Houston Siren, MD  oxyCODONE 10 MG TABS Take 1 tablet (10 mg total) by mouth every 4 (four) hours as needed for moderate pain (pain score 4-6). 01/18/18   Houston Siren, MD   Dg Abdomen 1 View  Result Date: 01/23/2018 CLINICAL DATA:  Abdominal pain and constipation. Surgery earlier this month. EXAM: ABDOMEN - 1 VIEW COMPARISON:  Pelvic radiograph 01/14/2018 FINDINGS: There is moderate diffuse gaseous  distension of the colon. Colon is again noted to extend inferior to the pelvis suggesting a large ventral hernia. A small to moderate amount of stool is present in the colon. No dilated small bowel loops are seen to suggest obstruction. Extensive atherosclerotic vascular calcifications are noted. There is severe left hip osteoarthrosis with deformity of the femoral head, unchanged. Diffuse lumbar disc degeneration and mild scoliosis are noted. IMPRESSION: 1. Moderate gaseous distension of the colon which may reflect ileus. 2. No evidence of small bowel obstruction. 3. Suspected large ventral hernia. Electronically Signed   By: Sebastian Ache M.D.   On: 01/23/2018 16:42   Ct Abdomen Pelvis W Contrast  Result Date: 01/23/2018 CLINICAL DATA:  Abdominal pain and constipation. No bowel movement since left tibia ORIF on 01/16/2018. EXAM: CT ABDOMEN AND PELVIS WITH CONTRAST TECHNIQUE: Multidetector CT imaging of the abdomen and pelvis was performed using the standard protocol following bolus administration of intravenous contrast. CONTRAST:  ISOVUE-300 IOPAMIDOL (ISOVUE-300) INJECTION 61% COMPARISON:  Abdominal x-ray from same day. FINDINGS: Lower chest: No acute abnormality.  Scarring in the left lower lobe. Hepatobiliary: No focal liver abnormality. Cholelithiasis. No gallbladder wall thickening or biliary dilatation. Pancreas: Unremarkable. No pancreatic ductal dilatation or surrounding inflammatory changes. Spleen: Normal in size without focal abnormality. Adrenals/Urinary Tract: 2.4 cm right adrenal nodule. 1.2 cm left adrenal nodule with focus of rim calcification. The left adrenal gland is unremarkable. Small right renal cysts. No renal or ureteral calculi. No hydronephrosis. The bladder is unremarkable. Stomach/Bowel: The colon is mildly distended to the level of a moderate left inguinal hernia which contains nondilated distal descending and proximal sigmoid colon. Large right inguinal hernia containing  prominently stool-filled cecum, as well as the terminal ileum and normal appearing appendix. The stomach and small bowel are unremarkable. Vascular/Lymphatic: Extensive aortoiliac atherosclerosis. No enlarged abdominal or pelvic lymph nodes. Reproductive: Prostate is unremarkable. Other: Small amount of free fluid along the inferior liver. No pneumoperitoneum. Musculoskeletal: No acute or significant osseous findings. Severe end-stage left hip osteoarthritis with bony remodeling of the femoral head and acetabulum. Severe lumbar spondylosis. IMPRESSION: 1. Mildly dilated descending and right colon with transition point seen as the distal descending colon enters a moderate left inguinal hernia. There is stool in the downstream sigmoid colon and rectum, suggesting only partial obstruction. 2. Large right inguinal hernia containing distended, stool-filled cecum, as well as the terminal ileum and a normal appendix. 3. Bilateral indeterminate adrenal nodules measuring up to 2.4 cm. Non-emergent adrenal protocol CT is recommended for further evaluation. This recommendation follows ACR consensus guidelines: Management of Incidental Adrenal Masses: A White Paper of the ACR Incidental Findings Committee. J Am Coll Radiol 2017;14:1038-1044. 4. Cholelithiasis. 5.  Aortic atherosclerosis (ICD10-I70.0). Electronically Signed   By: Teresita Madura  Derry M.D.   On: 01/23/2018 18:58   US Venous Img Lower Unilateral Left  Result Date: 01/23/2018 CLINICAL DATA:  Pain and swelling LEFT leg, prior surgery, question deep venous thrombosis EXAM: LEFT LOWER EXTREMITY VENOUS DOPPLER ULTRASOUND TECHNIQUE: Gray-scale sonography with graded compression, as well as color Doppler and duplex ultrasound were performed to evaluate the lower extremity deep venous systems from the level of the common femoral vein and including the common femoral, femoral, profunda femoral, popliteal and calf veins including the posterior tibial, peroneal and  gastrocnemius veins when visible. The superficial great saphenous vein was also interrogated. Spectral Doppler was utilized to evaluate flow at rest and with distal augmentation maneuvers in the common femoral, femoral and popliteal veins. At some sites compression is suboptimal due to patient intolerance of compression. COMPARISON:  None FINDINGS: Contralateral Common Femoral Vein: Respiratory phasicity is normal and symmetric with the symptomatic side. No evidence of thrombus. Normal compressibility. Common Femoral Vein: No evidence of thrombus. Normal compressibility, respiratory phasicity and response to augmentation. Saphenofemoral Junction: No evidence of thrombus. Normal compressibility and flow on color Doppler imaging. Profunda Femoral Vein: No evidence of thrombus. Normal compressibility and flow on color Doppler imaging. Femoral Vein: No evidence of thrombus. Normal compressibility, respiratory phasicity and response to augmentation. Popliteal Vein: No evidence of thrombus. Normal compressibility, respiratory phasicity and response to augmentation. Calf Veins: Suboptimally visualized peroneal veins. Visualized posterior tibial veins appear patent and compressible. Superficial Great Saphenous Vein: No evidence of deep venous thrombosis in the LEFT lower extremity. Venous Reflux:  None. Other Findings:  None. IMPRESSION: No evidence of deep venous thrombosis in the LEFT lower extremity. Electronically Signed   By: Ulyses Southward M.D.   On: 01/23/2018 18:52   Dg Abd Portable 2v  Result Date: 01/24/2018 CLINICAL DATA:  Large bowel obstruction. EXAM: PORTABLE ABDOMEN - 2 VIEW COMPARISON:  CT scan and radiographs of January 23, 2018. FINDINGS: Stable large bowel dilatation is noted concerning for distal colonic obstruction. Mild amount of small bowel dilatation is noted. Contrast is noted in urinary bladder. IMPRESSION: Stable large bowel dilatation is noted concerning for distal colonic obstruction.  Electronically Signed   By: Lupita Raider, M.D.   On: 01/24/2018 10:29    Positive ROS: All other systems have been reviewed and were otherwise negative with the exception of those mentioned in the HPI and as above.  Physical Exam: General: Alert, no acute distress  MUSCULOSKELETAL: Left leg: Patient's incision appears clean dry and intact.  His bandage has been changed.  He has scant dried blood on his dressing.  There is no active drainage from his incision.  There is no erythema ecchymosis or fluctuance seen.  Distally he has edema in his left foot with palpable pedal pulses.  Foot is warm to palpation.  He has intact sensation light touch.  He has intact motor function distally.  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  Plan: Appreciate general surgery managing patient's postop ileus.  Recent states he is passing gas now.  Patient has not been able to manage well at home after he was discharged.  Patient should receive physical therapy when medically appropriate.  He would benefit from a skilled nursing facility upon discharge.  Patient states that [facility] is near his house.  She will follow-up with me in 1 to 2 weeks following discharge.  He must remain nonweightbearing on the left lower extremity for a total  of 6 to 8 weeks postop.  He states he has been taking Lovenox at home and should continue Lovenox 40 mg daily and may convert to enteric-coated aspirin 325 mg p.o. twice daily once he is having bowel movements.  He will need DVT prophylaxis for a total of 6 weeks postop.  Patient should continue to elevate his left lower extremity whenever possible.  He should use his knee immobilizer when up out of bed.    Juanell Fairly, MD    01/24/2018 2:03 PM

## 2018-01-24 NOTE — Care Management (Signed)
Patient admitted from home with SBO.  Patient live at home alone . PCP Marvis Moeller.  [facility].  Patient recently discharged home with home health services through [facility].  Barbara Cower with [facility] is aware of admission.  Patient has Cane, RW, WC, and BSC in the home.  PT has assessed patient and recommends SNF.  Per PT patient agreeable to pursue SNF. CSW notified.  RNCM following for needs.

## 2018-01-24 NOTE — Progress Notes (Signed)
Per MD okay for RN to place order for trazodone 50 mg as needed and re order 21 mg nicotine patch.

## 2018-01-24 NOTE — NC FL2 (Signed)
[place] MEDICAID FL2 LEVEL OF CARE SCREENING TOOL     IDENTIFICATION  Patient Name: Alejandro Cook Birthdate: August 03, 1950 Sex: male Admission Date (Current Location): 01/23/2018  [facility] and [place] Number:  Chiropodist and Address:  [facility], [address] [place], [place] [address]      Provider Number: [id number]  Attending Physician Name and Address:  Bertrum Sol, MD  Relative Name and Phone Number:       Current Level of Care: Hospital Recommended Level of Care: Skilled Nursing Facility Prior Approval Number:    Date Approved/Denied:   PASRR Number:    Discharge Plan: SNF    Current Diagnoses: Patient Active Problem List   Diagnosis Date Noted  . Ogilvie's syndrome   . Bowel obstruction (HCC) 01/23/2018  . Hernia of abdominal wall 01/23/2018  . Tibia fracture 01/14/2018    Orientation RESPIRATION BLADDER Height & Weight     Self, Time, Situation, Place  Normal Continent Weight: 200 lb 1.6 oz (90.8 kg) Height:  5\' 11"  (180.3 cm)  BEHAVIORAL SYMPTOMS/MOOD NEUROLOGICAL BOWEL NUTRITION STATUS  (none) (none) Continent Diet(regular)  AMBULATORY STATUS COMMUNICATION OF NEEDS Skin   Extensive Assist Verbally Normal                       Personal Care Assistance Level of Assistance  Bathing, Feeding, Dressing Bathing Assistance: Limited assistance Feeding assistance: Limited assistance Dressing Assistance: Limited assistance     Functional Limitations Info             SPECIAL CARE FACTORS FREQUENCY  PT (By licensed PT)                    Contractures Contractures Info: Not present    Additional Factors Info  Code Status Code Status Info: full             Current Medications (01/24/2018):  This is the current hospital active medication list Current Facility-Administered Medications  Medication Dose Route Frequency Provider Last Rate Last Dose  . 0.9 %  sodium chloride infusion    Intravenous Continuous Altamese Dilling, MD 75 mL/hr at 01/24/18 1328    . acetaminophen (OFIRMEV) IV 1,000 mg  1,000 mg Intravenous Q6H Willy Eddy, MD 400 mL/hr at 01/24/18 1329 1,000 mg at 01/24/18 1329  . bisacodyl (DULCOLAX) suppository 10 mg  10 mg Rectal Daily PRN Altamese Dilling, MD      . buPROPion [facility] SR) 12 hr tablet 150 mg  150 mg Oral BID Altamese Dilling, MD   150 mg at 01/24/18 8119  . diclofenac (VOLTAREN) EC tablet 75 mg  75 mg Oral BID Altamese Dilling, MD   75 mg at 01/24/18 1478  . docusate sodium (COLACE) capsule 100 mg  100 mg Oral BID PRN Altamese Dilling, MD   100 mg at 01/24/18 0823  . DULoxetine (CYMBALTA) DR capsule 60 mg  60 mg Oral Daily Altamese Dilling, MD   60 mg at 01/24/18 0823  . enoxaparin (LOVENOX) injection 40 mg  40 mg Subcutaneous Q24H Altamese Dilling, MD   40 mg at 01/23/18 2259  . fentaNYL (SUBLIMAZE) injection 100 mcg  100 mcg Intravenous Q1H PRN Willy Eddy, MD   100 mcg at 01/23/18 2146  . methocarbamol (ROBAXIN) tablet 500 mg  500 mg Oral Q6H PRN Altamese Dilling, MD      . methylnaltrexone (RELISTOR) injection 12 mg  12 mg Subcutaneous Once Altamese Dilling, MD      .  morphine 2 MG/ML injection 2 mg  2 mg Intravenous Q4H PRN Altamese Dilling, MD   2 mg at 01/24/18 0523  . morphine 4 MG/ML injection 4 mg  4 mg Intravenous Q3H PRN Willy Eddy, MD   4 mg at 01/23/18 1915  . nicotine (NICODERM CQ - dosed in mg/24 hours) patch 21 mg  21 mg Transdermal Once Willy Eddy, MD   21 mg at 01/23/18 2020  . ondansetron (ZOFRAN) injection 4 mg  4 mg Intravenous Q6H PRN Altamese Dilling, MD      . oxyCODONE (Oxy IR/ROXICODONE) immediate release tablet 10 mg  10 mg Oral Q4H PRN Altamese Dilling, MD      . pravastatin (PRAVACHOL) tablet 40 mg  40 mg Oral q1800 Altamese Dilling, MD      . promethazine (PHENERGAN) injection 12.5 mg  12.5 mg Intravenous Q6H PRN  Willy Eddy, MD   12.5 mg at 01/24/18 1610     Discharge Medications: Please see discharge summary for a list of discharge medications.  Relevant Imaging Results:  Relevant Lab Results:   Additional Information ss: [id number]  York Spaniel, LCSW

## 2018-01-24 NOTE — Clinical Social Work Note (Signed)
Facility of choice offered. [facility] can take patient. Auth from insurance begun. York Spaniel MSW,LCSW [phone]

## 2018-01-24 NOTE — Progress Notes (Signed)
   01/24/18 1300  Clinical Encounter Type  Visited With Patient  Visit Type Initial;Spiritual support  Recommendations Follow-up as needed.  Spiritual Encounters  Spiritual Needs Emotional;Prayer   Chaplain has provided pastoral care to the patient and son on a previous visit to [facility]. Patient is hopeful that his medical issues will be resolved. He is considering a rehab facility, as his son is not able to care for all of his needs.

## 2018-01-24 NOTE — Progress Notes (Signed)
[employer] - [place] at [facility]   PATIENT NAME: Alejandro Cook    MR#:  [id number]  DATE OF BIRTH:  [birth year]  SUBJECTIVE:  CHIEF COMPLAINT:   Chief Complaint  Patient presents with  . Constipation  . Abdominal Pain  Patient continues to complain of abdominal pain and leg pain, surgery and ortho input appreciated  REVIEW OF SYSTEMS:  CONSTITUTIONAL: No fever, fatigue or weakness.  EYES: No blurred or double vision.  EARS, NOSE, AND THROAT: No tinnitus or ear pain.  RESPIRATORY: No cough, shortness of breath, wheezing or hemoptysis.  CARDIOVASCULAR: No chest pain, orthopnea, edema.  GASTROINTESTINAL: No nausea, vomiting, diarrhea or abdominal pain.  GENITOURINARY: No dysuria, hematuria.  ENDOCRINE: No polyuria, nocturia,  HEMATOLOGY: No anemia, easy bruising or bleeding SKIN: No rash or lesion. MUSCULOSKELETAL: No joint pain or arthritis.   NEUROLOGIC: No tingling, numbness, weakness.  PSYCHIATRY: No anxiety or depression.   ROS  DRUG ALLERGIES:  No Known Allergies  VITALS:  Blood pressure (!) 148/80, pulse 87, temperature 98.2 F (36.8 C), temperature source Oral, resp. rate 20, height 5\' 11"  (1.803 m), weight 90.8 kg, SpO2 94 %.  PHYSICAL EXAMINATION:  GENERAL:  67 y.o.-year-old patient lying in the bed with no acute distress.  EYES: Pupils equal, round, reactive to light and accommodation. No scleral icterus. Extraocular muscles intact.  HEENT: Head atraumatic, normocephalic. Oropharynx and nasopharynx clear.  NECK:  Supple, no jugular venous distention. No thyroid enlargement, no tenderness.  LUNGS: Normal breath sounds bilaterally, no wheezing, rales,rhonchi or crepitation. No use of accessory muscles of respiration.  CARDIOVASCULAR: S1, S2 normal. No murmurs, rubs, or gallops.  ABDOMEN: Soft, nontender, nondistended. Bowel sounds present. No organomegaly or mass.  EXTREMITIES: No pedal edema, cyanosis, or clubbing.  NEUROLOGIC: Cranial nerves II  through XII are intact. Muscle strength 5/5 in all extremities. Sensation intact. Gait not checked.  PSYCHIATRIC: The patient is alert and oriented x 3.  SKIN: No obvious rash, lesion, or ulcer.   Physical Exam LABORATORY PANEL:   CBC Recent Labs  Lab 01/24/18 0509  WBC 15.2*  HGB 10.0*  HCT 30.4*  PLT 533*   ------------------------------------------------------------------------------------------------------------------  Chemistries  Recent Labs  Lab 01/23/18 1702 01/24/18 0509  NA 135 136  K 3.6 3.3*  CL 100 105  CO2 25 25  GLUCOSE 107* 106*  BUN 24* 18  CREATININE 0.80 0.71  CALCIUM 9.1 8.4*  AST 18  --   ALT 21  --   ALKPHOS 78  --   BILITOT 0.8  --    ------------------------------------------------------------------------------------------------------------------  Cardiac Enzymes No results for input(s): TROPONINI in the last 168 hours. ------------------------------------------------------------------------------------------------------------------  RADIOLOGY:  Dg Abdomen 1 View  Result Date: 01/23/2018 CLINICAL DATA:  Abdominal pain and constipation. Surgery earlier this month. EXAM: ABDOMEN - 1 VIEW COMPARISON:  Pelvic radiograph 01/14/2018 FINDINGS: There is moderate diffuse gaseous distension of the colon. Colon is again noted to extend inferior to the pelvis suggesting a large ventral hernia. A small to moderate amount of stool is present in the colon. No dilated small bowel loops are seen to suggest obstruction. Extensive atherosclerotic vascular calcifications are noted. There is severe left hip osteoarthrosis with deformity of the femoral head, unchanged. Diffuse lumbar disc degeneration and mild scoliosis are noted. IMPRESSION: 1. Moderate gaseous distension of the colon which may reflect ileus. 2. No evidence of small bowel obstruction. 3. Suspected large ventral hernia. Electronically Signed   By: Sebastian Ache M.D.   On: 01/23/2018 16:42  Ct Abdomen  Pelvis W Contrast  Result Date: 01/23/2018 CLINICAL DATA:  Abdominal pain and constipation. No bowel movement since left tibia ORIF on 01/16/2018. EXAM: CT ABDOMEN AND PELVIS WITH CONTRAST TECHNIQUE: Multidetector CT imaging of the abdomen and pelvis was performed using the standard protocol following bolus administration of intravenous contrast. CONTRAST:  ISOVUE-300 IOPAMIDOL (ISOVUE-300) INJECTION 61% COMPARISON:  Abdominal x-ray from same day. FINDINGS: Lower chest: No acute abnormality.  Scarring in the left lower lobe. Hepatobiliary: No focal liver abnormality. Cholelithiasis. No gallbladder wall thickening or biliary dilatation. Pancreas: Unremarkable. No pancreatic ductal dilatation or surrounding inflammatory changes. Spleen: Normal in size without focal abnormality. Adrenals/Urinary Tract: 2.4 cm right adrenal nodule. 1.2 cm left adrenal nodule with focus of rim calcification. The left adrenal gland is unremarkable. Small right renal cysts. No renal or ureteral calculi. No hydronephrosis. The bladder is unremarkable. Stomach/Bowel: The colon is mildly distended to the level of a moderate left inguinal hernia which contains nondilated distal descending and proximal sigmoid colon. Large right inguinal hernia containing prominently stool-filled cecum, as well as the terminal ileum and normal appearing appendix. The stomach and small bowel are unremarkable. Vascular/Lymphatic: Extensive aortoiliac atherosclerosis. No enlarged abdominal or pelvic lymph nodes. Reproductive: Prostate is unremarkable. Other: Small amount of free fluid along the inferior liver. No pneumoperitoneum. Musculoskeletal: No acute or significant osseous findings. Severe end-stage left hip osteoarthritis with bony remodeling of the femoral head and acetabulum. Severe lumbar spondylosis. IMPRESSION: 1. Mildly dilated descending and right colon with transition point seen as the distal descending colon enters a moderate left inguinal  hernia. There is stool in the downstream sigmoid colon and rectum, suggesting only partial obstruction. 2. Large right inguinal hernia containing distended, stool-filled cecum, as well as the terminal ileum and a normal appendix. 3. Bilateral indeterminate adrenal nodules measuring up to 2.4 cm. Non-emergent adrenal protocol CT is recommended for further evaluation. This recommendation follows ACR consensus guidelines: Management of Incidental Adrenal Masses: A White Paper of the ACR Incidental Findings Committee. J Am Coll Radiol 2017;14:1038-1044. 4. Cholelithiasis. 5.  Aortic atherosclerosis (ICD10-I70.0). Electronically Signed   By: Obie Dredge M.D.   On: 01/23/2018 18:58   US Venous Img Lower Unilateral Left  Result Date: 01/23/2018 CLINICAL DATA:  Pain and swelling LEFT leg, prior surgery, question deep venous thrombosis EXAM: LEFT LOWER EXTREMITY VENOUS DOPPLER ULTRASOUND TECHNIQUE: Gray-scale sonography with graded compression, as well as color Doppler and duplex ultrasound were performed to evaluate the lower extremity deep venous systems from the level of the common femoral vein and including the common femoral, femoral, profunda femoral, popliteal and calf veins including the posterior tibial, peroneal and gastrocnemius veins when visible. The superficial great saphenous vein was also interrogated. Spectral Doppler was utilized to evaluate flow at rest and with distal augmentation maneuvers in the common femoral, femoral and popliteal veins. At some sites compression is suboptimal due to patient intolerance of compression. COMPARISON:  None FINDINGS: Contralateral Common Femoral Vein: Respiratory phasicity is normal and symmetric with the symptomatic side. No evidence of thrombus. Normal compressibility. Common Femoral Vein: No evidence of thrombus. Normal compressibility, respiratory phasicity and response to augmentation. Saphenofemoral Junction: No evidence of thrombus. Normal compressibility  and flow on color Doppler imaging. Profunda Femoral Vein: No evidence of thrombus. Normal compressibility and flow on color Doppler imaging. Femoral Vein: No evidence of thrombus. Normal compressibility, respiratory phasicity and response to augmentation. Popliteal Vein: No evidence of thrombus. Normal compressibility, respiratory phasicity and response to augmentation. Calf Veins:  Suboptimally visualized peroneal veins. Visualized posterior tibial veins appear patent and compressible. Superficial Great Saphenous Vein: No evidence of deep venous thrombosis in the LEFT lower extremity. Venous Reflux:  None. Other Findings:  None. IMPRESSION: No evidence of deep venous thrombosis in the LEFT lower extremity. Electronically Signed   By: Ulyses Southward M.D.   On: 01/23/2018 18:52   Dg Abd Portable 2v  Result Date: 01/24/2018 CLINICAL DATA:  Large bowel obstruction. EXAM: PORTABLE ABDOMEN - 2 VIEW COMPARISON:  CT scan and radiographs of January 23, 2018. FINDINGS: Stable large bowel dilatation is noted concerning for distal colonic obstruction. Mild amount of small bowel dilatation is noted. Contrast is noted in urinary bladder. IMPRESSION: Stable large bowel dilatation is noted concerning for distal colonic obstruction. Electronically Signed   By: Lupita Raider, M.D.   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-suggested to limit opioids as much as possible Advance to clear liquid diet per surgery recommendations, lactulose twice daily, Senokot at bedtime, decrease IV opioid meds, decrease oral opioid meds, and continue close medical monitoring   *Chronic inguinal hernias  General surgery input appreciated-no intervention needed at this time, will need to be repaired in the future once optimized medically on an outpatient basis   *Recent tibia and fibula fracture  surgery Doppler studies are negative for DVT, continue Lovenox subcu for for now-may convert to enteric-coated aspirin 325 mg twice daily once having bowel movements per orthopedic surgery-we will need to continue this for 6 weeks postop, continue knee immobilizer when out of bed-nonweightbearing status for 6-8 weeks, to follow-up with Dr. Jones Bales in 1 to 2 weeks for reevaluation   *Hypertension Restart lisinopril   *Hyperlipidemia Continue statin.  Disposition to inpatient rehab when bed is available  All the records are reviewed and case discussed with Care Management/Social Workerr. Management plans discussed with the patient, family and they are in agreement.  CODE STATUS:full  TOTAL TIME TAKING CARE OF THIS PATIENT: 45 minutes.     POSSIBLE D/C IN 1-2 DAYS, DEPENDING ON CLINICAL CONDITION.   Evelena Asa Miracle Criado M.D on 01/24/2018   Between 7am to 6pm - Pager - [phone]  After 6pm go to [email or web address] - password [email or web address]  [facility]  Office  [phone]  CC: Primary care physician; Leanna Sato, MD  Note: This dictation was prepared with Dragon dictation along with smaller phrase technology. Any transcriptional errors that result from this process are unintentional.

## 2018-01-24 NOTE — Clinical Social Work Note (Signed)
Clinical Social Work Assessment  Patient Details  Name: Alejandro Cook MRN: [id number] Date of Birth: Mar 08, 1951  Date of referral:  01/24/18               Reason for consult:  Facility Placement                Permission sought to share information with:  Oceanographer granted to share information::  Yes, Verbal Permission Granted  Name::        Agency::     Relationship::     Contact Information:     Housing/Transportation Living arrangements for the past 2 months:  Single Family Home Source of Information:  Patient Patient Interpreter Needed:  None Criminal Activity/Legal Involvement Pertinent to Current Situation/Hospitalization:  No - Comment as needed Significant Relationships:  Adult Children Lives with:  Self Do you feel safe going back to the place where you live?  Yes Need for family participation in patient care:  Yes (Comment)  Care giving concerns:  Patient resides at home with recent surgery.    Social Worker assessment / plan:  CSW informed that patient realized he could not manage at home and is now agreeable for short term rehab. Bed seaech initiated. Patient has [facility] and will need prior auth.   Employment status:    Insurance information:    PT Recommendations:  Skilled Nursing Facility Information / Referral to community resources:     Patient/Family's Response to care:  Patient expressed appreciation for CSW visit.  Patient/Family's Understanding of and Emotional Response to Diagnosis, Current Treatment, and Prognosis:  Patient states he realizes now that he should have agreed to short term rehab on his last visit.  Emotional Assessment Appearance:  Appears stated age Attitude/Demeanor/Rapport:  (pleasant and cooperative) Affect (typically observed):  Accepting, Calm Orientation:  Oriented to Self, Oriented to Place, Oriented to  Time, Oriented to Situation Alcohol / Substance use:  Not Applicable Psych  involvement (Current and /or in the community):  No (Comment)  Discharge Needs  Concerns to be addressed:  Care Coordination Readmission within the last 30 days:  No Current discharge risk:  None Barriers to Discharge:  Insurance Authorization   [place], [place] 01/24/2018, 4:54 PM

## 2018-01-24 NOTE — Consult Note (Signed)
Patient ID: Alejandro Cook, male   DOB: 09-Jul-1950, 67 y.o.   MRN: [id number]  HPI Alejandro Cook is a 67 y.o. male seen in consultation at the request of Dr. Roxan Hockey.  He was recently hospitalized for a left tibia fracture requiring ORIF by Dr. Martha Clan.  Symptoms last night for multiple complaints including left thigh and hip pain as well as some abdominal discomfort.  Of note the patient has had a chronic history of very large bilateral inguinal hernia for several years and he states that on the right side he has been unable to reduce it for the last 2 years.  He has been on narcotics since the injury of the fracture.  He reports that he is abdominal pain is mild there is no nausea no vomiting he is passing gas.  No fevers no chills.  He is main complaint today is left hip pain and muscle spasms.  He reports that the pains are moderate to severe intensity intermittent without any specific alleviating or aggravating factors.  His work-up including a CT scan as well as an x-ray that I have personally reviewed.  There is evidence of giant bilateral inguinal hernias on the right side there is cecum on the left side there is sigmoid colon.  There is a pattern consistent with Ogilvie's syndrome on x-ray.  White count is 15,000 and a hemoglobin of 10 platelets are 533.  Creatinine is 0.7 and potassium is 3.3  HPI  Past Medical History:  Diagnosis Date  . Depression   . Hyperlipidemia   . Hypertension     Past Surgical History:  Procedure Laterality Date  . CLOSED REDUCTION TIBIA Left 01/16/2018   Procedure: CLOSED REDUCTION TIBIA VS. ORIF TIBIA;  Surgeon: Juanell Fairly, MD;  Location: [facility] ORS;  Service: Orthopedics;  Laterality: Left;  . ORIF TIBIA FRACTURE Left 01/16/2018   Procedure: OPEN REDUCTION INTERNAL FIXATION (ORIF) TIBIA FRACTURE;  Surgeon: Juanell Fairly, MD;  Location: [facility] ORS;  Service: Orthopedics;  Laterality: Left;    Family History  Problem Relation Age of Onset  . Breast  cancer Mother   . CVA Father     Social History Social History   Tobacco Use  . Smoking status: Current Every Day Smoker    Types: Cigarettes  . Smokeless tobacco: Never Used  Substance Use Topics  . Alcohol use: Never    Frequency: Never  . Drug use: Yes    Types: Marijuana    No Known Allergies  Current Facility-Administered Medications  Medication Dose Route Frequency Provider Last Rate Last Dose  . 0.9 %  sodium chloride infusion   Intravenous Continuous Altamese Dilling, MD   Stopped at 01/24/18 0857  . acetaminophen (OFIRMEV) IV 1,000 mg  1,000 mg Intravenous Q6H Willy Eddy, MD   Stopped at 01/24/18 [phone]  . bisacodyl (DULCOLAX) suppository 10 mg  10 mg Rectal Daily PRN Altamese Dilling, MD      . buPROPion [facility] SR) 12 hr tablet 150 mg  150 mg Oral BID Altamese Dilling, MD   150 mg at 01/24/18 5621  . diclofenac (VOLTAREN) EC tablet 75 mg  75 mg Oral BID Altamese Dilling, MD   75 mg at 01/24/18 3086  . docusate sodium (COLACE) capsule 100 mg  100 mg Oral BID PRN Altamese Dilling, MD   100 mg at 01/24/18 0823  . DULoxetine (CYMBALTA) DR capsule 60 mg  60 mg Oral Daily Altamese Dilling, MD   60 mg at 01/24/18 0823  .  enoxaparin (LOVENOX) injection 40 mg  40 mg Subcutaneous Q24H Altamese Dilling, MD   40 mg at 01/23/18 2259  . fentaNYL (SUBLIMAZE) injection 100 mcg  100 mcg Intravenous Q1H PRN Willy Eddy, MD   100 mcg at 01/23/18 2146  . methocarbamol (ROBAXIN) tablet 500 mg  500 mg Oral Q6H PRN Altamese Dilling, MD      . methylnaltrexone (RELISTOR) injection 12 mg  12 mg Subcutaneous Once Altamese Dilling, MD      . morphine 2 MG/ML injection 2 mg  2 mg Intravenous Q4H PRN Altamese Dilling, MD   2 mg at 01/24/18 0523  . morphine 4 MG/ML injection 4 mg  4 mg Intravenous Q3H PRN Willy Eddy, MD   4 mg at 01/23/18 1915  . nicotine (NICODERM CQ - dosed in mg/24 hours) patch 21 mg  21 mg  Transdermal Once Willy Eddy, MD   21 mg at 01/23/18 2020  . ondansetron (ZOFRAN) injection 4 mg  4 mg Intravenous Q6H PRN Altamese Dilling, MD      . oxyCODONE (Oxy IR/ROXICODONE) immediate release tablet 10 mg  10 mg Oral Q4H PRN Altamese Dilling, MD      . pravastatin (PRAVACHOL) tablet 40 mg  40 mg Oral q1800 Altamese Dilling, MD      . promethazine (PHENERGAN) injection 12.5 mg  12.5 mg Intravenous Q6H PRN Willy Eddy, MD   12.5 mg at 01/24/18 1610     Review of Systems Full ROS  was asked and was negative except for the information on the HPI  Physical Exam Blood pressure 130/87, pulse 86, temperature 98.3 F (36.8 C), temperature source Oral, resp. rate 18, height 5\' 11"  (1.803 m), weight 90.8 kg, SpO2 95 %. CONSTITUTIONAL: NAD EYES: Pupils are equal, round, and reactive to light, Sclera are non-icteric. EARS, NOSE, MOUTH AND THROAT: The oropharynx is clear. The oral mucosa is pink and moist. Hearing is intact to voice. LYMPH NODES:  Lymph nodes in the neck are normal. RESPIRATORY:  Lungs are clear. There is normal respiratory effort, with equal breath sounds bilaterally, and without pathologic use of accessory muscles. CARDIOVASCULAR: Heart is regular without murmurs, gallops, or rubs. GI: The abdomen is soft, nontender, and nondistended. There are no palpable masses. There is no hepatosplenomegaly. There are normal bowel sounds in all quadrants. There is a reducible left inguinal hernia.  There is also a chronically incarcerated right inguinal hernia.  There is no evidence of peritonitis  GU: Rectal deferred.   MUSCULOSKELETAL: Normal muscle strength and tone. No cyanosis or edema.   The knee immobilizer in the left lower extremity. SKIN: Turgor is good and there are no pathologic skin lesions or ulcers. NEUROLOGIC: Motor and sensation is grossly normal. Cranial nerves are grossly intact. PSYCH:  Oriented to person, place and time. Affect is  normal.  Data Reviewed I have personally reviewed the patient's imaging, laboratory findings and medical records.    Assessment/Plan  67 year old male with reducible left inguinal hernia and chronically incarcerated right inguinal hernia containing cecum.  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 Recommend conservative management and trying to avoid opiates.  May have clear liquid diet.  Regarding the bilateral inguinal hernias I do think that at some point time he will need dose fix.  He needs to be optimized before we do that.  There is no evidence of impending strangulation from his hernias and as I said before I do think that he is  pseudo-large bowel obstruction is related to narcotics and medical issues rather than the hernias that have been chronically present for several years. We will continue to follow him   Sterling Big, MD FACS General Surgeon 01/24/2018, 11:14 AM

## 2018-01-25 LAB — COMPREHENSIVE METABOLIC PANEL
ALK PHOS: 87 U/L (ref 38–126)
ALT: 17 U/L (ref 0–44)
ANION GAP: 9 (ref 5–15)
AST: 16 U/L (ref 15–41)
Albumin: 3.2 g/dL — ABNORMAL LOW (ref 3.5–5.0)
BUN: 24 mg/dL — ABNORMAL HIGH (ref 8–23)
CALCIUM: 8.7 mg/dL — AB (ref 8.9–10.3)
CHLORIDE: 103 mmol/L (ref 98–111)
CO2: 21 mmol/L — AB (ref 22–32)
Creatinine, Ser: 0.92 mg/dL (ref 0.61–1.24)
Glucose, Bld: 130 mg/dL — ABNORMAL HIGH (ref 70–99)
Potassium: 3.4 mmol/L — ABNORMAL LOW (ref 3.5–5.1)
SODIUM: 133 mmol/L — AB (ref 135–145)
Total Bilirubin: 0.7 mg/dL (ref 0.3–1.2)
Total Protein: 6.4 g/dL — ABNORMAL LOW (ref 6.5–8.1)

## 2018-01-25 LAB — POTASSIUM: POTASSIUM: 3.5 mmol/L (ref 3.5–5.1)

## 2018-01-25 LAB — PHOSPHORUS: PHOSPHORUS: 4 mg/dL (ref 2.5–4.6)

## 2018-01-25 LAB — CBC
HCT: 36.6 % — ABNORMAL LOW (ref 39.0–52.0)
Hemoglobin: 12.2 g/dL — ABNORMAL LOW (ref 13.0–17.0)
MCH: 32.3 pg (ref 26.0–34.0)
MCHC: 33.3 g/dL (ref 30.0–36.0)
MCV: 96.8 fL (ref 80.0–100.0)
PLATELETS: 611 10*3/uL — AB (ref 150–400)
RBC: 3.78 MIL/uL — ABNORMAL LOW (ref 4.22–5.81)
RDW: 13.8 % (ref 11.5–15.5)
WBC: 17.9 10*3/uL — ABNORMAL HIGH (ref 4.0–10.5)
nRBC: 0 % (ref 0.0–0.2)

## 2018-01-25 LAB — MAGNESIUM: MAGNESIUM: 2.2 mg/dL (ref 1.7–2.4)

## 2018-01-25 LAB — GLUCOSE, CAPILLARY: GLUCOSE-CAPILLARY: 107 mg/dL — AB (ref 70–99)

## 2018-01-25 MED ORDER — ALUM & MAG HYDROXIDE-SIMETH 200-200-20 MG/5ML PO SUSP
30.0000 mL | Freq: Four times a day (QID) | ORAL | Status: DC | PRN
  Administered 2018-01-25 – 2018-01-27 (×3): 30 mL via ORAL
  Filled 2018-01-25 (×3): qty 30

## 2018-01-25 MED ORDER — BISACODYL 10 MG RE SUPP
10.0000 mg | Freq: Every day | RECTAL | Status: DC
  Administered 2018-01-25 – 2018-01-27 (×3): 10 mg via RECTAL
  Filled 2018-01-25 (×4): qty 1

## 2018-01-25 MED ORDER — POLYETHYLENE GLYCOL 3350 17 G PO PACK
17.0000 g | PACK | Freq: Every day | ORAL | Status: DC
  Administered 2018-01-26 – 2018-01-31 (×6): 17 g via ORAL
  Filled 2018-01-25 (×6): qty 1

## 2018-01-25 NOTE — Progress Notes (Signed)
[employer] -  at [facility]   PATIENT NAME: Alejandro Cook    MR#:  [id number]  DATE OF BIRTH:  1950/09/04  SUBJECTIVE:  CHIEF COMPLAINT:   Chief Complaint  Patient presents with  . Constipation  . Abdominal Pain  Patient have less abdominal pain and leg pain, surgery and ortho input appreciated. Had good BM.  REVIEW OF SYSTEMS:  CONSTITUTIONAL: No fever, fatigue or weakness.  EYES: No blurred or double vision.  EARS, NOSE, AND THROAT: No tinnitus or ear pain.  RESPIRATORY: No cough, shortness of breath, wheezing or hemoptysis.  CARDIOVASCULAR: No chest pain, orthopnea, edema.  GASTROINTESTINAL: No nausea, vomiting, diarrhea or abdominal pain.  GENITOURINARY: No dysuria, hematuria.  ENDOCRINE: No polyuria, nocturia,  HEMATOLOGY: No anemia, easy bruising or bleeding SKIN: No rash or lesion. MUSCULOSKELETAL: No joint pain or arthritis.   NEUROLOGIC: No tingling, numbness, weakness.  PSYCHIATRY: No anxiety or depression.   ROS  DRUG ALLERGIES:  No Known Allergies  VITALS:  Blood pressure 123/80, pulse 95, temperature 98.3 F (36.8 C), temperature source Oral, resp. rate 20, height 5\' 11"  (1.803 m), weight 90.8 kg, SpO2 94 %.  PHYSICAL EXAMINATION:  GENERAL:  67 y.o.-year-old patient lying in the bed with no acute distress.  EYES: Pupils equal, round, reactive to light and accommodation. No scleral icterus. Extraocular muscles intact.  HEENT: Head atraumatic, normocephalic. Oropharynx and nasopharynx clear.  NECK:  Supple, no jugular venous distention. No thyroid enlargement, no tenderness.  LUNGS: Normal breath sounds bilaterally, no wheezing, rales,rhonchi or crepitation. No use of accessory muscles of respiration.  CARDIOVASCULAR: S1, S2 normal. No murmurs, rubs, or gallops.  ABDOMEN: Soft, nontender, distended. Bowel sounds present. No organomegaly or mass.  EXTREMITIES: No pedal edema, cyanosis, or clubbing. Left leg in post surgical  dressing. NEUROLOGIC: Cranial nerves II through XII are intact. Muscle strength 5/5 in all extremities. Sensation intact. Gait not checked.  PSYCHIATRIC: The patient is alert and oriented x 3.  SKIN: No obvious rash, lesion, or ulcer.   Physical Exam LABORATORY PANEL:   CBC Recent Labs  Lab 01/25/18 0938  WBC 17.9*  HGB 12.2*  HCT 36.6*  PLT 611*   ------------------------------------------------------------------------------------------------------------------  Chemistries  Recent Labs  Lab 01/25/18 1057  NA 133*  K 3.4*  CL 103  CO2 21*  GLUCOSE 130*  BUN 24*  CREATININE 0.92  CALCIUM 8.7*  MG 2.2  AST 16  ALT 17  ALKPHOS 87  BILITOT 0.7   ------------------------------------------------------------------------------------------------------------------  Cardiac Enzymes No results for input(s): TROPONINI in the last 168 hours. ------------------------------------------------------------------------------------------------------------------  RADIOLOGY:  Dg Abd Portable 2v  Result Date: 01/24/2018 CLINICAL DATA:  Large bowel obstruction. EXAM: PORTABLE ABDOMEN - 2 VIEW COMPARISON:  CT scan and radiographs of January 23, 2018. FINDINGS: Stable large bowel dilatation is noted concerning for distal colonic obstruction. Mild amount of small bowel dilatation is noted. Contrast is noted in urinary bladder. IMPRESSION: Stable large bowel dilatation is noted concerning for distal colonic obstruction. Electronically Signed   By: Lupita Raider, M.D.   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 Also called GI consult, but as patient had good bowel movement, GI suggested no further recommendations or need for consult.  *Chronic inguinal hernias  General surgery input appreciated-no intervention needed at this time, will need to be repaired in the future once optimized medically on an outpatient basis   *Recent tibia and fibula fracture surgery Doppler studies are negative for DVT, continue Lovenox subcu for for now-may convert to enteric-coated aspirin 325 mg twice daily once having bowel movements per orthopedic surgery-we will need to continue this for 6 weeks postop, continue knee immobilizer when out of bed-nonweightbearing status for 6-8 weeks, to follow-up with Dr. Jones Bales in 1 to 2 weeks for reevaluation   *Hypertension Restart lisinopril   *Hyperlipidemia Continue statin.  Disposition to inpatient rehab when bed is available and after having good bowel movement.  Recheck x-ray KUB tomorrow.  All the records are reviewed and case discussed with Care Management/Social Workerr. Management plans discussed with the patient, family and they are in agreement.  CODE STATUS:full  TOTAL TIME TAKING CARE OF THIS PATIENT: 45 minutes.     POSSIBLE D/C IN 1-2 DAYS, DEPENDING ON CLINICAL CONDITION.   Altamese Dilling M.D on 01/25/2018   Between 7am to 6pm - Pager - [phone]  After 6pm go to [email or web address] - password [email or web address]  [facility]  Office  [phone]  CC: Primary care physician; Leanna Sato, MD  Note: This dictation was prepared with Dragon dictation along with smaller phrase technology. Any transcriptional errors that result from this process are unintentional.

## 2018-01-25 NOTE — Progress Notes (Addendum)
[facility] Progress Note     Subjective: Patient resting in bed this morning. He notes that his abdomen continues to feel distended but he is without pain. Denied any nausea or emesis. Has been on a clear liquid diet. Notes that he has passed some flatus but also endorses hiccups and burping. Not mobilizing 2/2 to recent leg surgery, PT following, may benefit from SNF at discharge  Objective: Vital signs in last 24 hours: Temp:  [96.7 F (35.9 C)-98.2 F (36.8 C)] 96.7 F (35.9 C) (10/17 0800) Pulse Rate:  [82-87] 84 (10/17 0800) Resp:  [16-20] 16 (10/17 0800) BP: (127-148)/(76-88) 129/76 (10/17 0800) SpO2:  [94 %-96 %] 96 % (10/17 0800) Last BM Date: (pt unsure)  Intake/Output from previous day: 10/16 0701 - 10/17 0700 In: 880.5 [I.V.:680.5; IV Piggyback:200] Out: 350 [Urine:350] Intake/Output this shift: No intake/output data recorded.  PE: Gen:  Alert, NAD, pleasant Card:  Regular rate and rhythm Pulm:  Normal effort, clear to auscultation bilaterally Abd: Soft, non-tender, distended, tympanic to percussion Skin: warm and dry, no rashes  Psych: A&Ox3   Lab Results:  Recent Labs    01/23/18 1702 01/24/18 0509  WBC 13.7* 15.2*  HGB 10.9* 10.0*  HCT 33.1* 30.4*  PLT 519* 533*   BMET Recent Labs    01/23/18 1702 01/24/18 0509 01/25/18 0513  NA 135 136  --   K 3.6 3.3* 3.5  CL 100 105  --   CO2 25 25  --   GLUCOSE 107* 106*  --   BUN 24* 18  --   CREATININE 0.80 0.71  --   CALCIUM 9.1 8.4*  --    PT/INR No results for input(s): LABPROT, INR in the last 72 hours. CMP     Component Value Date/Time   NA 136 01/24/2018 0509   NA 139 04/15/2014 1601   K 3.5 01/25/2018 0513   K 4.3 04/15/2014 1601   CL 105 01/24/2018 0509   CL 104 04/15/2014 1601   CO2 25 01/24/2018 0509   CO2 30 04/15/2014 1601   GLUCOSE 106 (H) 01/24/2018 0509   GLUCOSE 97 04/15/2014 1601   BUN 18 01/24/2018 0509   BUN 10 04/15/2014 1601   CREATININE 0.71  01/24/2018 0509   CREATININE 0.80 04/15/2014 1601   CALCIUM 8.4 (L) 01/24/2018 0509   CALCIUM 9.0 04/15/2014 1601   PROT 6.8 01/23/2018 1702   ALBUMIN 3.4 (L) 01/23/2018 1702   AST 18 01/23/2018 1702   ALT 21 01/23/2018 1702   ALKPHOS 78 01/23/2018 1702   BILITOT 0.8 01/23/2018 1702   GFRNONAA >60 01/24/2018 0509   GFRNONAA >60 04/15/2014 1601   GFRAA >60 01/24/2018 0509   GFRAA >60 04/15/2014 1601   Lipase  No results found for: LIPASE     Studies/Results: Dg Abdomen 1 View  Result Date: 01/23/2018 CLINICAL DATA:  Abdominal pain and constipation. Surgery earlier this month. EXAM: ABDOMEN - 1 VIEW COMPARISON:  Pelvic radiograph 01/14/2018 FINDINGS: There is moderate diffuse gaseous distension of the colon. Colon is again noted to extend inferior to the pelvis suggesting a large ventral hernia. A small to moderate amount of stool is present in the colon. No dilated small bowel loops are seen to suggest obstruction. Extensive atherosclerotic vascular calcifications are noted. There is severe left hip osteoarthrosis with deformity of the femoral head, unchanged. Diffuse lumbar disc degeneration and mild scoliosis are noted. IMPRESSION: 1. Moderate gaseous distension of the colon which may reflect ileus. 2. No evidence  of small bowel obstruction. 3. Suspected large ventral hernia. Electronically Signed   By: Sebastian Ache M.D.   On: 01/23/2018 16:42   Ct Abdomen Pelvis W Contrast  Result Date: 01/23/2018 CLINICAL DATA:  Abdominal pain and constipation. No bowel movement since left tibia ORIF on 01/16/2018. EXAM: CT ABDOMEN AND PELVIS WITH CONTRAST TECHNIQUE: Multidetector CT imaging of the abdomen and pelvis was performed using the standard protocol following bolus administration of intravenous contrast. CONTRAST:  ISOVUE-300 IOPAMIDOL (ISOVUE-300) INJECTION 61% COMPARISON:  Abdominal x-ray from same day. FINDINGS: Lower chest: No acute abnormality.  Scarring in the left lower lobe.  Hepatobiliary: No focal liver abnormality. Cholelithiasis. No gallbladder wall thickening or biliary dilatation. Pancreas: Unremarkable. No pancreatic ductal dilatation or surrounding inflammatory changes. Spleen: Normal in size without focal abnormality. Adrenals/Urinary Tract: 2.4 cm right adrenal nodule. 1.2 cm left adrenal nodule with focus of rim calcification. The left adrenal gland is unremarkable. Small right renal cysts. No renal or ureteral calculi. No hydronephrosis. The bladder is unremarkable. Stomach/Bowel: The colon is mildly distended to the level of a moderate left inguinal hernia which contains nondilated distal descending and proximal sigmoid colon. Large right inguinal hernia containing prominently stool-filled cecum, as well as the terminal ileum and normal appearing appendix. The stomach and small bowel are unremarkable. Vascular/Lymphatic: Extensive aortoiliac atherosclerosis. No enlarged abdominal or pelvic lymph nodes. Reproductive: Prostate is unremarkable. Other: Small amount of free fluid along the inferior liver. No pneumoperitoneum. Musculoskeletal: No acute or significant osseous findings. Severe end-stage left hip osteoarthritis with bony remodeling of the femoral head and acetabulum. Severe lumbar spondylosis. IMPRESSION: 1. Mildly dilated descending and right colon with transition point seen as the distal descending colon enters a moderate left inguinal hernia. There is stool in the downstream sigmoid colon and rectum, suggesting only partial obstruction. 2. Large right inguinal hernia containing distended, stool-filled cecum, as well as the terminal ileum and a normal appendix. 3. Bilateral indeterminate adrenal nodules measuring up to 2.4 cm. Non-emergent adrenal protocol CT is recommended for further evaluation. This recommendation follows ACR consensus guidelines: Management of Incidental Adrenal Masses: A White Paper of the ACR Incidental Findings Committee. J Am Coll Radiol  2017;14:1038-1044. 4. Cholelithiasis. 5.  Aortic atherosclerosis (ICD10-I70.0). Electronically Signed   By: Obie Dredge M.D.   On: 01/23/2018 18:58   US Venous Img Lower Unilateral Left  Result Date: 01/23/2018 CLINICAL DATA:  Pain and swelling LEFT leg, prior surgery, question deep venous thrombosis EXAM: LEFT LOWER EXTREMITY VENOUS DOPPLER ULTRASOUND TECHNIQUE: Gray-scale sonography with graded compression, as well as color Doppler and duplex ultrasound were performed to evaluate the lower extremity deep venous systems from the level of the common femoral vein and including the common femoral, femoral, profunda femoral, popliteal and calf veins including the posterior tibial, peroneal and gastrocnemius veins when visible. The superficial great saphenous vein was also interrogated. Spectral Doppler was utilized to evaluate flow at rest and with distal augmentation maneuvers in the common femoral, femoral and popliteal veins. At some sites compression is suboptimal due to patient intolerance of compression. COMPARISON:  None FINDINGS: Contralateral Common Femoral Vein: Respiratory phasicity is normal and symmetric with the symptomatic side. No evidence of thrombus. Normal compressibility. Common Femoral Vein: No evidence of thrombus. Normal compressibility, respiratory phasicity and response to augmentation. Saphenofemoral Junction: No evidence of thrombus. Normal compressibility and flow on color Doppler imaging. Profunda Femoral Vein: No evidence of thrombus. Normal compressibility and flow on color Doppler imaging. Femoral Vein: No evidence of thrombus.  Normal compressibility, respiratory phasicity and response to augmentation. Popliteal Vein: No evidence of thrombus. Normal compressibility, respiratory phasicity and response to augmentation. Calf Veins: Suboptimally visualized peroneal veins. Visualized posterior tibial veins appear patent and compressible. Superficial Great Saphenous Vein: No evidence  of deep venous thrombosis in the LEFT lower extremity. Venous Reflux:  None. Other Findings:  None. IMPRESSION: No evidence of deep venous thrombosis in the LEFT lower extremity. Electronically Signed   By: Ulyses Southward M.D.   On: 01/23/2018 18:52   Dg Abd Portable 2v  Result Date: 01/24/2018 CLINICAL DATA:  Large bowel obstruction. EXAM: PORTABLE ABDOMEN - 2 VIEW COMPARISON:  CT scan and radiographs of January 23, 2018. FINDINGS: Stable large bowel dilatation is noted concerning for distal colonic obstruction. Mild amount of small bowel dilatation is noted. Contrast is noted in urinary bladder. IMPRESSION: Stable large bowel dilatation is noted concerning for distal colonic obstruction. Electronically Signed   By: Lupita Raider, M.D.   On: 01/24/2018 10:29    Anti-infectives: Anti-infectives (From admission, onward)   None       Assessment/Plan  Post-Operative Ileus - Patient case discussed with Dr. Everlene Farrier. Again does not appear to be consistent with large bowel obstruction related to his hernias but rather more consistent with Ogilvie's syndrome.  - Stay on clears given no improvement in distension - Monitor ongoing bowel function and serial abdominal exams - Will get GI consultation for evaluation and management recommendation regarding Ogilies Syndrome and possible endoscopic decompression - IVF for maintenance - Pain control (minimize narcotics)  - Mobilization/Physical Therapy as tolerates, likely benefit from SNF at discharge.  - Medicine Primary   LOS: 2 days    Lynden Oxford , PA-C [facility] 01/25/2018, 9:28 AM [phone] M-F: 7am - 4pm

## 2018-01-25 NOTE — Progress Notes (Signed)
Physical Therapy Treatment Patient Details Name: Alejandro Cook MRN: [id number] DOB: 1951/03/28 Today's Date: 01/25/2018    History of Present Illness Pt is a 67 y/o M s/p fall with resultant L tibial and fibular fx.  Pt is now s/p ORIF.  Pt was then d/c to home and returned due to bowel obstruction and inguinal hernia. 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    PT Comments    Alejandro Cook was very pleasant and put forth good effort with mobility this session but was limited by fatigue, pain, and generally not feeling well. Pt required min assist for supine>sit and mod +2 assist for sit>supine.  He required mod +2 assist for sit>stand transfer and stood for ~5 minutes at bedside.  Pt performed long duration endurance sitting EOB with 1UE supported with min guard. SNF remains appropriate d/c plan.    Follow Up Recommendations  SNF     Equipment Recommendations  None recommended by PT    Recommendations for Other Services       Precautions / Restrictions Precautions Precautions: Fall Required Braces or Orthoses: Knee Immobilizer - Left Knee Immobilizer - Left: On at all times Restrictions Weight Bearing Restrictions: Yes LLE Weight Bearing: Touchdown weight bearing    Mobility  Bed Mobility Overal bed mobility: Needs Assistance Bed Mobility: Supine to Sit;Sit to Supine     Supine to sit: Min assist;HOB elevated Sit to supine: Mod assist;+2 for physical assistance   General bed mobility comments: Assist to elevate trunk and to advance LLE to EOB.  To return to supine, assist to trunk and LE management  Transfers Overall transfer level: Needs assistance Equipment used: Rolling walker (2 wheeled) Transfers: Sit to/from Stand Sit to Stand: +2 physical assistance;From elevated surface;Mod assist         General transfer comment: Pt stood from elevated bed x1.  Assist to boost to standing, pt with flexed posture with NT performing pericare.  Pt  stood for ~5 minutes.   Ambulation/Gait             General Gait Details: Unable to perform this session due to fatigue and pain   Stairs             Wheelchair Mobility    Modified Rankin (Stroke Patients Only)       Balance Overall balance assessment: Needs assistance;History of Falls Sitting-balance support: Single extremity supported;Feet supported Sitting balance-Leahy Scale: Poor Sitting balance - Comments: Pt relies on at least 1UE support while sitting EOB   Standing balance support: Bilateral upper extremity supported;During functional activity Standing balance-Leahy Scale: Poor Standing balance comment: Pt relies on BUE support standing statically at bedside                            Cognition Arousal/Alertness: Awake/alert Behavior During Therapy: WFL for tasks assessed/performed Overall Cognitive Status: Within Functional Limits for tasks assessed                                        Exercises      General Comments General comments (skin integrity, edema, etc.): BP taken at end of session with pt in supine as pt reported mild lightheadedness: 123/85      Pertinent Vitals/Pain Pain Assessment: Faces Faces Pain Scale: Hurts whole lot Pain Location: LLE (pain  greater in LLE) Pain Descriptors / Indicators: Aching;Grimacing;Guarding;Spasm Pain Intervention(s): Limited activity within patient's tolerance;Monitored during session;Repositioned    Home Living                      Prior Function            PT Goals (current goals can now be found in the care plan section) Acute Rehab PT Goals Patient Stated Goal: to go to rehab PT Goal Formulation: With patient Time For Goal Achievement: 02/07/18 Potential to Achieve Goals: Fair Progress towards PT goals: Not progressing toward goals - comment(limited by fatigue and pain)    Frequency    7X/week      PT Plan Current plan remains appropriate     Co-evaluation              AM-PAC PT "6 Clicks" Daily Activity  Outcome Measure  Difficulty turning over in bed (including adjusting bedclothes, sheets and blankets)?: Unable Difficulty moving from lying on back to sitting on the side of the bed? : Unable Difficulty sitting down on and standing up from a chair with arms (e.g., wheelchair, bedside commode, etc,.)?: Unable Help needed moving to and from a bed to chair (including a wheelchair)?: A Lot Help needed walking in hospital room?: Total Help needed climbing 3-5 steps with a railing? : Total 6 Click Score: 7    End of Session Equipment Utilized During Treatment: Gait belt Activity Tolerance: Patient limited by fatigue;Patient limited by pain Patient left: in bed;with call bell/phone within reach;with bed alarm set Nurse Communication: Mobility status;Other (comment)(+BM) PT Visit Diagnosis: Pain;Unsteadiness on feet (R26.81);Other abnormalities of gait and mobility (R26.89);Muscle weakness (generalized) (M62.81);History of falling (Z91.81);Difficulty in walking, not elsewhere classified (R26.2) Pain - Right/Left: Left Pain - part of body: Leg     Time: 1610-9604 PT Time Calculation (min) (ACUTE ONLY): 34 min  Charges:  $Therapeutic Activity: 23-37 mins                     Encarnacion Chu PT, DPT 01/25/2018, 11:36 AM

## 2018-01-25 NOTE — Clinical Social Work Note (Signed)
CSW waiting on auth from [facility].  York Spaniel MSW,LCSW [phone]

## 2018-01-26 ENCOUNTER — Inpatient Hospital Stay: Payer: Medicare PPO

## 2018-01-26 DIAGNOSIS — K56609 Unspecified intestinal obstruction, unspecified as to partial versus complete obstruction: Secondary | ICD-10-CM

## 2018-01-26 LAB — BASIC METABOLIC PANEL
Anion gap: 9 (ref 5–15)
BUN: 26 mg/dL — ABNORMAL HIGH (ref 8–23)
CO2: 23 mmol/L (ref 22–32)
Calcium: 8.5 mg/dL — ABNORMAL LOW (ref 8.9–10.3)
Chloride: 104 mmol/L (ref 98–111)
Creatinine, Ser: 0.98 mg/dL (ref 0.61–1.24)
GFR calc Af Amer: >60 mL/min (ref >60)
Glucose, Bld: 126 mg/dL — ABNORMAL HIGH (ref 70–99)
Potassium: 3.3 mmol/L — ABNORMAL LOW (ref 3.5–5.1)
Sodium: 136 mmol/L (ref 135–145)

## 2018-01-26 LAB — CBC
HEMATOCRIT: 31.9 % — AB (ref 39.0–52.0)
Hemoglobin: 10.5 g/dL — ABNORMAL LOW (ref 13.0–17.0)
MCH: 31.6 pg (ref 26.0–34.0)
MCHC: 32.9 g/dL (ref 30.0–36.0)
MCV: 96.1 fL (ref 80.0–100.0)
PLATELETS: 513 10*3/uL — AB (ref 150–400)
RBC: 3.32 MIL/uL — ABNORMAL LOW (ref 4.22–5.81)
RDW: 13.7 % (ref 11.5–15.5)
WBC: 16.8 10*3/uL — AB (ref 4.0–10.5)
nRBC: 0 % (ref 0.0–0.2)

## 2018-01-26 MED ORDER — POTASSIUM CHLORIDE CRYS ER 20 MEQ PO TBCR
40.0000 meq | EXTENDED_RELEASE_TABLET | Freq: Two times a day (BID) | ORAL | Status: AC
  Administered 2018-01-26 – 2018-01-27 (×2): 40 meq via ORAL
  Filled 2018-01-26 (×2): qty 2

## 2018-01-26 MED ORDER — IOTHALAMATE MEGLUMINE 17.2 % UR SOLN
750.0000 mL | Freq: Once | URETHRAL | Status: AC | PRN
  Administered 2018-01-26: 750 mL

## 2018-01-26 NOTE — Progress Notes (Signed)
Midge Minium, MD [facility]   [address] [place], [place] [address] Phone: [phone] Fax : [phone]   Subjective: The patient continues to have constipation but states that he had a large bowel movement today.  Patient also has had leg pain and thought he was going to rehabilitation today but that was postponed.  He continues to have bowel distention.   Objective: Vital signs in last 24 hours: Vitals:   01/25/18 2021 01/26/18 0405 01/26/18 1533 01/26/18 2049  BP: 123/80 123/81 113/73 117/75  Pulse: 95 87 94 82  Resp: 20 20  18   Temp: 98.3 F (36.8 C) 98.2 F (36.8 C) 97.8 F (36.6 C) 98.5 F (36.9 C)  TempSrc: Oral Oral Oral Oral  SpO2: 94% 92% 91% 94%  Weight:      Height:       Weight change:   Intake/Output Summary (Last 24 hours) at 01/26/2018 2124 Last data filed at 01/26/2018 1846 Gross per 24 hour  Intake 720 ml  Output 200 ml  Net 520 ml     Exam: Heart:: Regular rate and rhythm, S1S2 present or without murmur or extra heart sounds Lungs: normal and clear to auscultation and percussion Abdomen: soft, Positive distention, normal bowel sounds   Lab Results: @LABTEST2 @ Micro Results: No results found for this or any previous visit (from the past 240 hour(s)). Studies/Results: Dg Abd 1 View  Result Date: 01/26/2018 CLINICAL DATA:  Abdominal pain and constipation. EXAM: ABDOMEN - 1 VIEW COMPARISON:  01/24/2018 and CT 01/23/2018 FINDINGS: Examination continues demonstrate air-filled mildly dilated colon to the level of the sigmoid colon without significant change. There is at least 1 dilated air-filled small bowel loops in the upper abdomen measuring 4.9 cm in diameter. Air-filled colonic loops over the inguinal regions bilaterally with the cecum extending into the scrotal region compatible with patient's known bowel containing hernias seen on recent CT scan. No evidence of free peritoneal air. Remainder the exam is unchanged. IMPRESSION: Continued  air-filled mildly dilated colon to the level of the sigmoid colon. New dilated small bowel loop over the upper abdomen measuring 4.9 cm suggesting worsening obstruction. Site of obstruction may be patient's known colon containing bilateral inguinal/scrotal hernias as seen on recent CT. Electronically Signed   By: Elberta Fortis M.D.   On: 01/26/2018 08:51   Dg Colon W/water Sol Cm  Result Date: 01/26/2018 CLINICAL DATA:  Ov syndrome.  Constipation.  Recent surgery. EXAM: COLON WITH WATER SOLUTION CONTRAST COMPARISON:  02/26/2018.  CT 01/23/2018. FINDINGS: Water-soluble contrast entered per rectum. Only a small amount of contrast could be administered as the patient could not hold the contrast. Again noted are bilateral inguinal hernias with diffuse bowel distention. Although the bowel distention may be related adynamic ileus related to the patient's recent surgery obstruction from bilateral hernias cannot be entirely excluded. IMPRESSION: 1.  Bilateral inguinal hernias.  Diffuse bowel distention. 2. Patient could not hold contrast for water-soluble barium enema. Findings discussed with the patient's physician. Electronically Signed   By: Maisie Fus  Register   On: 01/26/2018 11:33   Medications: I have reviewed the patient's current medications. Scheduled Meds: . bisacodyl  10 mg Rectal Daily  . buPROPion  150 mg Oral BID  . diclofenac  75 mg Oral BID  . DULoxetine  60 mg Oral Daily  . enoxaparin  40 mg Subcutaneous Q24H  . lactulose  20 g Oral BID  . lisinopril  20 mg Oral Daily  . methylnaltrexone  12 mg Subcutaneous  Once  . nicotine  21 mg Transdermal Daily  . polyethylene glycol  17 g Oral Daily  . potassium chloride  40 mEq Oral BID  . pravastatin  40 mg Oral q1800  . senna-docusate  2 tablet Oral QHS   Continuous Infusions: PRN Meds:.alum & mag hydroxide-simeth, bisacodyl, methocarbamol, ondansetron (ZOFRAN) IV, oxyCODONE, oxyCODONE, promethazine, traZODone   Assessment: Active  Problems:   Large bowel obstruction (HCC)   Hernia of abdominal wall   Ogilvie's syndrome    Plan: The patient had a water-soluble barium enema today that showed him to have bilateral inguinal hernias with diffuse bowel distention.  The patient could not hold the contrast in the enema. The KUB showed him to have a mildly dilated colon to the level of the sigmoid colon with new dilation of the small bowel suggestive of possible worsening obstruction.   The patient will be followed for worsening of his abdominal distention versus continued passage of stools.  He has again been told that he will need a colonoscopy at some future time.   LOS: 3 days   Midge Minium 01/26/2018, 9:24 PM

## 2018-01-26 NOTE — Progress Notes (Signed)
Physical Therapy Treatment Patient Details Name: Alejandro Cook MRN: [id number] DOB: [birth year] Today's Date: 01/26/2018    History of Present Illness Pt is a 67 y/o M s/p fall with resultant L tibial and fibular fx.  Pt is now s/p ORIF.  Pt was then d/c to home and returned due to bowel obstruction and inguinal hernia. 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     PT Comments    Mr. Oldaker continues to put forth good effort with therapy.  He was not feeling well today but made progress with his mobility compared to previous session.  Pt performed sit<>stand x2 from bed and performed hop x1 toward [facility] with assist.  SNF remains most appropriate d/c plan at this time.     Follow Up Recommendations  SNF     Equipment Recommendations  None recommended by PT    Recommendations for Other Services       Precautions / Restrictions Precautions Precautions: Fall Required Braces or Orthoses: Knee Immobilizer - Left Knee Immobilizer - Left: On at all times Restrictions Weight Bearing Restrictions: Yes LLE Weight Bearing: Touchdown weight bearing    Mobility  Bed Mobility Overal bed mobility: Needs Assistance Bed Mobility: Supine to Sit;Sit to Supine     Supine to sit: Min assist;HOB elevated Sit to supine: Mod assist;+2 for physical assistance   General bed mobility comments: Assist to elevate trunk and to advance LLE to EOB.  To return to supine, assist to trunk and LE management  Transfers Overall transfer level: Needs assistance Equipment used: Rolling walker (2 wheeled) Transfers: Sit to/from Stand Sit to Stand: +2 physical assistance;From elevated surface;Mod assist         General transfer comment: Pt stood from elevated bed x2.  Assist to boost to standing, pt able to achieve closer to full upright posture standing EOB this session. Pt does require cues for proper hand placement with sit<>stand.   Ambulation/Gait Ambulation/Gait  assistance: Mod assist;+2 physical assistance Gait Distance (Feet): 1 Feet Assistive device: Rolling walker (2 wheeled) Gait Pattern/deviations: (hop on RLE)     General Gait Details: Pt hopped x2 in place after first sit>stand.  Pt hopped to his R at bedside toward [facility] x1 after second sit>stand.  Pt demonstrates instability requiring assist while performing hop.    Stairs             Wheelchair Mobility    Modified Rankin (Stroke Patients Only)       Balance Overall balance assessment: Needs assistance;History of Falls Sitting-balance support: Single extremity supported;Feet supported Sitting balance-Leahy Scale: Poor Sitting balance - Comments: Pt relies on at least 1UE support while sitting EOB   Standing balance support: Bilateral upper extremity supported;During functional activity Standing balance-Leahy Scale: Poor Standing balance comment: Pt relies on BUE support standing statically at bedside and while hopping                            Cognition Arousal/Alertness: Awake/alert Behavior During Therapy: WFL for tasks assessed/performed Overall Cognitive Status: Within Functional Limits for tasks assessed                                        Exercises General Exercises - Lower Extremity Straight Leg Raises: AROM;Left;5 reps;Seated Other Exercises Other Exercises: Pt sat EOB ~7 minutes  with 1UE support with min guard assist    General Comments General comments (skin integrity, edema, etc.): Pt reported and demonstrated irritation of sking (pink discoloration) on medial L malleolus from KI.  RN notified who placed skin barrier and lower portion of KI loosened while pt supine in bed. RN to monitor.       Pertinent Vitals/Pain Pain Assessment: 0-10 Pain Score: 8  Pain Location: LLE  Pain Descriptors / Indicators: Aching;Grimacing;Guarding;Spasm Pain Intervention(s): Limited activity within patient's tolerance;Monitored during  session;Repositioned;Other (comment)(repositioned with pillow between LEs at end of session)    Home Living                      Prior Function            PT Goals (current goals can now be found in the care plan section) Acute Rehab PT Goals Patient Stated Goal: to go to rehab PT Goal Formulation: With patient Time For Goal Achievement: 02/07/18 Potential to Achieve Goals: Fair Progress towards PT goals: Progressing toward goals    Frequency    7X/week      PT Plan Current plan remains appropriate    Co-evaluation              AM-PAC PT "6 Clicks" Daily Activity  Outcome Measure  Difficulty turning over in bed (including adjusting bedclothes, sheets and blankets)?: Unable Difficulty moving from lying on back to sitting on the side of the bed? : Unable Difficulty sitting down on and standing up from a chair with arms (e.g., wheelchair, bedside commode, etc,.)?: Unable Help needed moving to and from a bed to chair (including a wheelchair)?: A Lot Help needed walking in hospital room?: Total Help needed climbing 3-5 steps with a railing? : Total 6 Click Score: 7    End of Session Equipment Utilized During Treatment: Gait belt;Left knee immobilizer Activity Tolerance: Patient limited by fatigue;Patient limited by pain Patient left: in bed;with call bell/phone within reach;with bed alarm set;Other (comment)(with pillow between LEs to avoid excessive L hip adduction) Nurse Communication: Mobility status;Other (comment);Weight bearing status(RN made aware of irritation from [facility]) PT Visit Diagnosis: Pain;Unsteadiness on feet (R26.81);Other abnormalities of gait and mobility (R26.89);Muscle weakness (generalized) (M62.81);History of falling (Z91.81);Difficulty in walking, not elsewhere classified (R26.2) Pain - Right/Left: Left Pain - part of body: Leg     Time: 1610-9604 PT Time Calculation (min) (ACUTE ONLY): 33 min  Charges:  $Therapeutic Activity: 23-37  mins                    Encarnacion Chu PT, DPT 01/26/2018, 4:42 PM

## 2018-01-26 NOTE — Progress Notes (Addendum)
[facility] Progress Note     Subjective: Patient resting comfortably in bed this afternoon. He feels that his abdomen is still distended but improved. No complaints of pain, nausea, or emesis. Does endorse he is starting to have hiccups. Unsure when they started but notices them more. Has had multiple BM yesterday. Not mobilizing 2/2 recent ORIF.   Attempted to get gastrografin enema study this morning to assess for colonic obstruction but study was inadequate.   Objective: Vital signs in last 24 hours: Temp:  [97.6 F (36.4 C)-98.3 F (36.8 C)] 98.2 F (36.8 C) (10/18 0405) Pulse Rate:  [87-95] 87 (10/18 0405) Resp:  [16-20] 20 (10/18 0405) BP: (123-128)/(80-83) 123/81 (10/18 0405) SpO2:  [92 %-95 %] 92 % (10/18 0405) Last BM Date: 01/26/18  Intake/Output from previous day: 10/17 0701 - 10/18 0700 In: 480 [P.O.:480] Out: 400 [Urine:400] Intake/Output this shift: No intake/output data recorded.  PE: Gen:  Alert, NAD, pleasant Card:  Regular rate and rhythm Pulm:  Normal effort, clear to auscultation bilaterally Abd: Soft, non-tender, distended, tympanic to percussion Skin: warm and dry, no rashes  Psych: A&Ox3   Lab Results:  Recent Labs    01/25/18 0938 01/26/18 1031  WBC 17.9* 16.8*  HGB 12.2* 10.5*  HCT 36.6* 31.9*  PLT 611* 513*   BMET Recent Labs    01/24/18 0509 01/25/18 0513 01/25/18 1057  NA 136  --  133*  K 3.3* 3.5 3.4*  CL 105  --  103  CO2 25  --  21*  GLUCOSE 106*  --  130*  BUN 18  --  24*  CREATININE 0.71  --  0.92  CALCIUM 8.4*  --  8.7*   PT/INR No results for input(s): LABPROT, INR in the last 72 hours. CMP     Component Value Date/Time   NA 133 (L) 01/25/2018 1057   NA 139 04/15/2014 1601   K 3.4 (L) 01/25/2018 1057   K 4.3 04/15/2014 1601   CL 103 01/25/2018 1057   CL 104 04/15/2014 1601   CO2 21 (L) 01/25/2018 1057   CO2 30 04/15/2014 1601   GLUCOSE 130 (H) 01/25/2018 1057   GLUCOSE 97 04/15/2014 1601   BUN 24 (H) 01/25/2018 1057   BUN 10 04/15/2014 1601   CREATININE 0.92 01/25/2018 1057   CREATININE 0.80 04/15/2014 1601   CALCIUM 8.7 (L) 01/25/2018 1057   CALCIUM 9.0 04/15/2014 1601   PROT 6.4 (L) 01/25/2018 1057   ALBUMIN 3.2 (L) 01/25/2018 1057   AST 16 01/25/2018 1057   ALT 17 01/25/2018 1057   ALKPHOS 87 01/25/2018 1057   BILITOT 0.7 01/25/2018 1057   GFRNONAA >60 01/25/2018 1057   GFRNONAA >60 04/15/2014 1601   GFRAA >60 01/25/2018 1057   GFRAA >60 04/15/2014 1601   Lipase  No results found for: LIPASE     Studies/Results: Dg Abd 1 View  Result Date: 01/26/2018 CLINICAL DATA:  Abdominal pain and constipation. EXAM: ABDOMEN - 1 VIEW COMPARISON:  01/24/2018 and CT 01/23/2018 FINDINGS: Examination continues demonstrate air-filled mildly dilated colon to the level of the sigmoid colon without significant change. There is at least 1 dilated air-filled small bowel loops in the upper abdomen measuring 4.9 cm in diameter. Air-filled colonic loops over the inguinal regions bilaterally with the cecum extending into the scrotal region compatible with patient's known bowel containing hernias seen on recent CT scan. No evidence of free peritoneal air. Remainder the exam is unchanged. IMPRESSION: Continued air-filled mildly dilated colon  to the level of the sigmoid colon. New dilated small bowel loop over the upper abdomen measuring 4.9 cm suggesting worsening obstruction. Site of obstruction may be patient's known colon containing bilateral inguinal/scrotal hernias as seen on recent CT. Electronically Signed   By: Elberta Fortis M.D.   On: 01/26/2018 08:51    Anti-infectives: Anti-infectives (From admission, onward)   None       Assessment/Plan  Post-Operative Ileus Patient case discussed with Dr. Everlene Farrier. Again does not appear to be consistent with large bowel obstruction related to his hernias but rather more consistent with Ogilvie's syndrome.   - Stay on clears for now - KUB  shows worsening dilation pattern and new small bowel dilation. Attempted to obtain gastrografin enema to r/o large bowel obstruction however patient could not hold contrast. D/w Dr Everlene Farrier, recommends further GI evaluation (consulted 10/17 but deferred recommendations 2/2 BMs) for possible management options including neostigmine despite patient having bowel movements.  - Monitor ongoing bowel function and serial abdominal exams - IVF for maintenance - Pain control (minimize narcotics)  - Mobilization/Physical Therapy as tolerates, likely benefit from SNF at discharge.  - Medicine Primary    LOS: 3 days    Lynden Oxford , PA-C [facility] 01/26/2018, 10:48 AM [phone] M-F: 7am - 4pm

## 2018-01-26 NOTE — Clinical Social Work Note (Addendum)
CSW contacted [facility] and spoke to [place] and she stated that they received the information I faxed night before last but that it has not been assigned for review as of yet. York Spaniel MSW,LCSW [phone]

## 2018-01-26 NOTE — Progress Notes (Signed)
[employer] -  at [facility]   PATIENT NAME: Alejandro Cook    MR#:  [id number]  DATE OF BIRTH:  May 19, 1950  SUBJECTIVE:  CHIEF COMPLAINT:   Chief Complaint  Patient presents with  . Constipation  . Abdominal Pain  Patient have less abdominal pain and leg pain, surgery and ortho input appreciated. Had good BM. Still persist Bowel distention.  REVIEW OF SYSTEMS:  CONSTITUTIONAL: No fever, fatigue or weakness.  EYES: No blurred or double vision.  EARS, NOSE, AND THROAT: No tinnitus or ear pain.  RESPIRATORY: No cough, shortness of breath, wheezing or hemoptysis.  CARDIOVASCULAR: No chest pain, orthopnea, edema.  GASTROINTESTINAL: No nausea, vomiting, diarrhea or abdominal pain.  GENITOURINARY: No dysuria, hematuria.  ENDOCRINE: No polyuria, nocturia,  HEMATOLOGY: No anemia, easy bruising or bleeding SKIN: No rash or lesion. MUSCULOSKELETAL: No joint pain or arthritis.   NEUROLOGIC: No tingling, numbness, weakness.  PSYCHIATRY: No anxiety or depression.   ROS  DRUG ALLERGIES:  No Known Allergies  VITALS:  Blood pressure 113/73, pulse 94, temperature 97.8 F (36.6 C), temperature source Oral, resp. rate 20, height 5\' 11"  (1.803 m), weight 90.8 kg, SpO2 91 %.  PHYSICAL EXAMINATION:  GENERAL:  67 y.o.-year-old patient lying in the bed with no acute distress.  EYES: Pupils equal, round, reactive to light and accommodation. No scleral icterus. Extraocular muscles intact.  HEENT: Head atraumatic, normocephalic. Oropharynx and nasopharynx clear.  NECK:  Supple, no jugular venous distention. No thyroid enlargement, no tenderness.  LUNGS: Normal breath sounds bilaterally, no wheezing, rales,rhonchi or crepitation. No use of accessory muscles of respiration.  CARDIOVASCULAR: S1, S2 normal. No murmurs, rubs, or gallops.  ABDOMEN: Soft, nontender, distended. Bowel sounds present. No organomegaly or mass. Inguinal hernia. EXTREMITIES: No pedal edema, cyanosis, or  clubbing. Left leg in post surgical dressing. NEUROLOGIC: Cranial nerves II through XII are intact. Muscle strength 4/5 in all extremities. Sensation intact. Gait not checked.  PSYCHIATRIC: The patient is alert and oriented x 3.  SKIN: No obvious rash, lesion, or ulcer.   Physical Exam LABORATORY PANEL:   CBC Recent Labs  Lab 01/26/18 1031  WBC 16.8*  HGB 10.5*  HCT 31.9*  PLT 513*   ------------------------------------------------------------------------------------------------------------------  Chemistries  Recent Labs  Lab 01/25/18 1057 01/26/18 1031  NA 133* 136  K 3.4* 3.3*  CL 103 104  CO2 21* 23  GLUCOSE 130* 126*  BUN 24* 26*  CREATININE 0.92 0.98  CALCIUM 8.7* 8.5*  MG 2.2  --   AST 16  --   ALT 17  --   ALKPHOS 87  --   BILITOT 0.7  --    ------------------------------------------------------------------------------------------------------------------  Cardiac Enzymes No results for input(s): TROPONINI in the last 168 hours. ------------------------------------------------------------------------------------------------------------------  RADIOLOGY:  Dg Abd 1 View  Result Date: 01/26/2018 CLINICAL DATA:  Abdominal pain and constipation. EXAM: ABDOMEN - 1 VIEW COMPARISON:  01/24/2018 and CT 01/23/2018 FINDINGS: Examination continues demonstrate air-filled mildly dilated colon to the level of the sigmoid colon without significant change. There is at least 1 dilated air-filled small bowel loops in the upper abdomen measuring 4.9 cm in diameter. Air-filled colonic loops over the inguinal regions bilaterally with the cecum extending into the scrotal region compatible with patient's known bowel containing hernias seen on recent CT scan. No evidence of free peritoneal air. Remainder the exam is unchanged. IMPRESSION: Continued air-filled mildly dilated colon to the level of the sigmoid colon. New dilated small bowel loop over the upper abdomen measuring 4.9  cm  suggesting worsening obstruction. Site of obstruction may be patient's known colon containing bilateral inguinal/scrotal hernias as seen on recent CT. Electronically Signed   By: Elberta Fortis M.D.   On: 01/26/2018 08:51   Dg Colon W/water Sol Cm  Result Date: 01/26/2018 CLINICAL DATA:  Ov syndrome.  Constipation.  Recent surgery. EXAM: COLON WITH WATER SOLUTION CONTRAST COMPARISON:  02/26/2018.  CT 01/23/2018. FINDINGS: Water-soluble contrast entered per rectum. Only a small amount of contrast could be administered as the patient could not hold the contrast. Again noted are bilateral inguinal hernias with diffuse bowel distention. Although the bowel distention may be related adynamic ileus related to the patient's recent surgery obstruction from bilateral hernias cannot be entirely excluded. IMPRESSION: 1.  Bilateral inguinal hernias.  Diffuse bowel distention. 2. Patient could not hold contrast for water-soluble barium enema. Findings discussed with the patient's physician. 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 Also called GI consult, but as patient had good bowel movement, GI suggested no further recommendations or need for consult. Pt persist to have severe dilation per Xray- Called surgery now- Tried Gastrograffin enema Xray, pt could not hold it enough, so no good images, but surgery again suggest to call GI, request placed to Dr. Maximino Greenland.  *Chronic inguinal hernias  General surgery input appreciated-no intervention  needed at this time, will need to be repaired in the future once optimized medically on an outpatient basis   *Recent tibia and fibula fracture surgery Doppler studies are negative for DVT, continue Lovenox subcu for for now-may convert to enteric-coated aspirin 325 mg twice daily once having bowel movements per orthopedic surgery-we will need to continue this for 6 weeks postop, continue knee immobilizer when out of bed-nonweightbearing status for 6-8 weeks, to follow-up with Dr. Jones Bales in 1 to 2 weeks for reevaluation   *Hypertension Restart lisinopril   *Hyperlipidemia Continue statin.  Disposition to inpatient rehab when bed is available and after having good bowel movement.  Recheck x-ray KUB tomorrow.  All the records are reviewed and case discussed with Care Management/Social Workerr. Management plans discussed with the patient, family and they are in agreement.  CODE STATUS:full  TOTAL TIME TAKING CARE OF THIS PATIENT: 45 minutes.     POSSIBLE D/C IN 1-2 DAYS, DEPENDING ON CLINICAL CONDITION.   Altamese Dilling M.D on 01/26/2018   Between 7am to 6pm - Pager - [phone]  After 6pm go to [email or web address] - password [email or web address]  [facility]  Office  [phone]  CC: Primary care physician; Leanna Sato, MD  Note: This dictation was prepared with Dragon dictation along with smaller phrase technology. Any transcriptional errors that result from this process are unintentional.

## 2018-01-27 ENCOUNTER — Inpatient Hospital Stay: Payer: Medicare PPO

## 2018-01-27 DIAGNOSIS — R140 Abdominal distension (gaseous): Secondary | ICD-10-CM

## 2018-01-27 LAB — BASIC METABOLIC PANEL
Anion gap: 8 (ref 5–15)
BUN: 25 mg/dL — AB (ref 8–23)
CALCIUM: 8.2 mg/dL — AB (ref 8.9–10.3)
CO2: 24 mmol/L (ref 22–32)
CREATININE: 1.02 mg/dL (ref 0.61–1.24)
Chloride: 104 mmol/L (ref 98–111)
GFR calc non Af Amer: >60 mL/min (ref >60)
GLUCOSE: 105 mg/dL — AB (ref 70–99)
Potassium: 3.8 mmol/L (ref 3.5–5.1)
Sodium: 136 mmol/L (ref 135–145)

## 2018-01-27 MED ORDER — MAGNESIUM CITRATE PO SOLN
1.0000 | Freq: Once | ORAL | Status: AC
  Administered 2018-01-27: 1 via ORAL
  Filled 2018-01-27: qty 296

## 2018-01-27 MED ORDER — ZOLPIDEM TARTRATE 5 MG PO TABS
5.0000 mg | ORAL_TABLET | Freq: Every evening | ORAL | Status: DC | PRN
  Administered 2018-01-27: 5 mg via ORAL
  Filled 2018-01-27: qty 1

## 2018-01-27 NOTE — Progress Notes (Signed)
Per GI and surgery, okay to insert rectal tube.

## 2018-01-27 NOTE — Progress Notes (Signed)
PT Cancellation Note  Patient Details Name: Alejandro Cook MRN: [id number] DOB: 1950/11/30   Cancelled Treatment:    Reason Eval/Treat Not Completed: Other (comment)   Pt attempted x 2 this am within 30 minutes.  Pt on bedpan both attempts.  On return, pt stated he received some medication this am to assist with BM but it has not worked yet.  Encouraged pt to try bedside commode but he refused.  Education provided regarding mobility and bowel function.  He continued to decline.  Education provided regarding skin intergrity as he stated he has been on bedpan for at least an hour.  Offered to removed bedpan until he felt the need for it back and he continued to decline "I want to keep it right there."  Discussed with nurse tech who stated he has given her the same responses when she asked to remove it.  Call bell in reach and pt encouraged to call staff when he would allow it to be removed.   Danielle Dess 01/27/2018, 11:37 AM

## 2018-01-27 NOTE — Progress Notes (Signed)
[facility] - [place] at [facility]      PATIENT NAME: Alejandro Cook    MR#:  [id number]  DATE OF BIRTH:  1950/05/29  SUBJECTIVE:   Still complaining of some abdominal pain/distension.  Patient also having some intermittent nausea.  REVIEW OF SYSTEMS:    Review of Systems  Constitutional: Negative for chills and fever.  HENT: Negative for congestion and tinnitus.   Eyes: Negative for blurred vision and double vision.  Respiratory: Negative for cough, shortness of breath and wheezing.   Cardiovascular: Negative for chest pain, orthopnea and PND.  Gastrointestinal: Positive for abdominal pain and nausea. Negative for diarrhea and vomiting.       Abdominal distension.   Genitourinary: Negative for dysuria and hematuria.  Neurological: Negative for dizziness, sensory change and focal weakness.  All other systems reviewed and are negative.   Nutrition: Clear Liquid Tolerating Diet: Yes Tolerating PT: Eval noted.   DRUG ALLERGIES:  No Known Allergies  VITALS:  Blood pressure 121/78, pulse 82, temperature (!) 97.5 F (36.4 C), temperature source Oral, resp. rate 18, height 5\' 11"  (1.803 m), weight 90.8 kg, SpO2 93 %.  PHYSICAL EXAMINATION:   Physical Exam  GENERAL:  67 y.o.-year-old patient lying in bed in no acute distress.  EYES: Pupils equal, round, reactive to light and accommodation. No scleral icterus. Extraocular muscles intact.  HEENT: Head atraumatic, normocephalic. Oropharynx and nasopharynx clear.  NECK:  Supple, no jugular venous distention. No thyroid enlargement, no tenderness.  LUNGS: Normal breath sounds bilaterally, no wheezing, rales, rhonchi. No use of accessory muscles of respiration.  CARDIOVASCULAR: S1, S2 normal. No murmurs, rubs, or gallops.  ABDOMEN: Soft, NT, Distended diffusely, hypoactive BS. No organomegaly or mass.  EXTREMITIES: No cyanosis, clubbing or edema b/l.   LLE in a Immobilizer NEUROLOGIC: Cranial nerves II through XII  are intact. No focal Motor or sensory deficits b/l.  Globally weak PSYCHIATRIC: The patient is alert and oriented x 3.  SKIN: No obvious rash, lesion, or ulcer.    LABORATORY PANEL:   CBC Recent Labs  Lab 01/26/18 1031  WBC 16.8*  HGB 10.5*  HCT 31.9*  PLT 513*   ------------------------------------------------------------------------------------------------------------------  Chemistries  Recent Labs  Lab 01/25/18 1057  01/27/18 0427  NA 133*   < > 136  K 3.4*   < > 3.8  CL 103   < > 104  CO2 21*   < > 24  GLUCOSE 130*   < > 105*  BUN 24*   < > 25*  CREATININE 0.92   < > 1.02  CALCIUM 8.7*   < > 8.2*  MG 2.2  --   --   AST 16  --   --   ALT 17  --   --   ALKPHOS 87  --   --   BILITOT 0.7  --   --    < > = values in this interval not displayed.   ------------------------------------------------------------------------------------------------------------------  Cardiac Enzymes No results for input(s): TROPONINI in the last 168 hours. ------------------------------------------------------------------------------------------------------------------  RADIOLOGY:  Dg Abd 1 View  Result Date: 01/26/2018 CLINICAL DATA:  Abdominal pain and constipation. EXAM: ABDOMEN - 1 VIEW COMPARISON:  01/24/2018 and CT 01/23/2018 FINDINGS: Examination continues demonstrate air-filled mildly dilated colon to the level of the sigmoid colon without significant change. There is at least 1 dilated air-filled small bowel loops in the upper abdomen measuring 4.9 cm in diameter. Air-filled colonic loops over the inguinal regions bilaterally with the cecum  extending into the scrotal region compatible with patient's known bowel containing hernias seen on recent CT scan. No evidence of free peritoneal air. Remainder the exam is unchanged. IMPRESSION: Continued air-filled mildly dilated colon to the level of the sigmoid colon. New dilated small bowel loop over the upper abdomen measuring 4.9 cm  suggesting worsening obstruction. Site of obstruction may be patient's known colon containing bilateral inguinal/scrotal hernias as seen on recent CT. Electronically Signed   By: Elberta Fortis M.D.   On: 01/26/2018 08:51   Dg Abd 2 Views  Result Date: 01/27/2018 CLINICAL DATA:  67 year old male with a history of postop ileus EXAM: ABDOMEN - 2 VIEW COMPARISON:  CT 01/23/2018, plain film 01/24/2018, 01/26/2018 FINDINGS: Persistently dilated colon throughout its length, with gas extending to the rectum. Small amount of retained contrast within the rectum, status post attempt at Gastrografin enema. Large fecal stool burden. Similar degree of gas within the mid abdomen within small bowel. Chronic fracture at the left femoral neck again demonstrated. Osteopenia. Degenerative changes of the spine. IMPRESSION: Unchanged appearance of significant colonic gaseous distention, with large fecal burden. Electronically Signed   By: Gilmer Mor D.O.   On: 01/27/2018 08:52   Dg Colon W/water Sol Cm  Result Date: 01/26/2018 CLINICAL DATA:  Ov syndrome.  Constipation.  Recent surgery. EXAM: COLON WITH WATER SOLUTION CONTRAST COMPARISON:  02/26/2018.  CT 01/23/2018. FINDINGS: Water-soluble contrast entered per rectum. Only a small amount of contrast could be administered as the patient could not hold the contrast. Again noted are bilateral inguinal hernias with diffuse bowel distention. Although the bowel distention may be related adynamic ileus related to the patient's recent surgery obstruction from bilateral hernias cannot be entirely excluded. IMPRESSION: 1.  Bilateral inguinal hernias.  Diffuse bowel distention. 2. Patient could not hold contrast for water-soluble barium enema. Findings discussed with the patient's physician. Electronically Signed   By: Maisie Fus  Register   On: 01/26/2018 11:33     ASSESSMENT AND PLAN:   67 year old male with past medical history of hypertension, hyperlipidemia, depression, recent  fall with left tibial and fibular fracture status post ORIF who presented to the hospital due to abdominal pain and distention and noted to have colonic ileus/severe constipation.  1.  Abdominal pain-secondary to severe constipation/colonic ileus.  Patient's had abdominal x-ray is morning also showing significant gaseous distention of his colon with significant fecal burden. - Patient is status post enema's, Dulcolax suppository, Relistor, and laxatives and continues to have significant abdominal distention and pain. -Seen by gastroenterology and also surgery and no plans for colonic decompression presently, as per surgery no acute indications for surgical intervention. - Continue lactulose, Dulcolax suppository, MiraLAX, and given one dose of Mg. Citrate today and if not improving consider placing a rectal tube for decompression.  If patient has nausea vomiting may also need an NG tube. -Continue clear liquid diet for now.  2.  Status post recent fall with left fibular and tibial fracture status post ORIF-continue pain control with oxycodone but would minimize pain meds given his colonic ileus/obstipation/constipation.  3.  Hyperlipidemia-continue Pravachol.  4.  Essential hypertension-continue lisinopril.  5.  Depression-continue Wellbutrin, Cymbalta.   All the records are reviewed and case discussed with Care Management/Social Worker. Management plans discussed with the patient, family and they are in agreement.  CODE STATUS: Full code  DVT Prophylaxis: Lovenox  TOTAL TIME TAKING CARE OF THIS PATIENT: 30 minutes.   POSSIBLE D/C IN 2-3 DAYS, DEPENDING ON CLINICAL CONDITION.   SAINANI,VIVEK  J M.D on 01/27/2018 at 2:06 PM  Between 7am to 6pm - Pager - [phone]  After 6pm go to [email or web address] - Social research officer, government  [facility] [place] [facility]  Office  [phone]  CC: Primary care physician; Leanna Sato, MD

## 2018-01-27 NOTE — Progress Notes (Addendum)
Melodie Bouillon, MD [address], [address], [place], [place], [address] [address], [place], [place], [address] Phone: [phone]  Fax: [phone]   Subjective: Patient with continued abdominal distention on x-ray. Clinically he states his abdomen is less distended and feels better than 2 days ago. Reports passing gas last night. No N/V   Objective: Exam: Vital signs in last 24 hours: Vitals:   01/26/18 0405 01/26/18 1533 01/26/18 2049 01/27/18 0438  BP: 123/81 113/73 117/75 113/78  Pulse: 87 94 82 81  Resp: 20  18 18   Temp: 98.2 F (36.8 C) 97.8 F (36.6 C) 98.5 F (36.9 C) 98.4 F (36.9 C)  TempSrc: Oral Oral Oral Oral  SpO2: 92% 91% 94% 93%  Weight:      Height:       Weight change:   Intake/Output Summary (Last 24 hours) at 01/27/2018 0958 Last data filed at 01/27/2018 0644 Gross per 24 hour  Intake 720 ml  Output 300 ml  Net 420 ml    General: No acute distress, AAO x3 Abd: Soft, distended, non tender, no guarding,  No HSM Skin: Warm, no rashes Neck: Supple, Trachea midline   Lab Results: Lab Results  Component Value Date   WBC 16.8 (H) 01/26/2018   HGB 10.5 (L) 01/26/2018   HCT 31.9 (L) 01/26/2018   MCV 96.1 01/26/2018   PLT 513 (H) 01/26/2018   Micro Results: No results found for this or any previous visit (from the past 240 hour(s)). Studies/Results: Dg Abd 1 View  Result Date: 01/26/2018 CLINICAL DATA:  Abdominal pain and constipation. EXAM: ABDOMEN - 1 VIEW COMPARISON:  01/24/2018 and CT 01/23/2018 FINDINGS: Examination continues demonstrate air-filled mildly dilated colon to the level of the sigmoid colon without significant change. There is at least 1 dilated air-filled small bowel loops in the upper abdomen measuring 4.9 cm in diameter. Air-filled colonic loops over the inguinal regions bilaterally with the cecum extending into the scrotal region compatible with patient's known bowel containing hernias seen on recent CT  scan. No evidence of free peritoneal air. Remainder the exam is unchanged. IMPRESSION: Continued air-filled mildly dilated colon to the level of the sigmoid colon. New dilated small bowel loop over the upper abdomen measuring 4.9 cm suggesting worsening obstruction. Site of obstruction may be patient's known colon containing bilateral inguinal/scrotal hernias as seen on recent CT. Electronically Signed   By: Elberta Fortis M.D.   On: 01/26/2018 08:51   Dg Abd 2 Views  Result Date: 01/27/2018 CLINICAL DATA:  67 year old male with a history of postop ileus EXAM: ABDOMEN - 2 VIEW COMPARISON:  CT 01/23/2018, plain film 01/24/2018, 01/26/2018 FINDINGS: Persistently dilated colon throughout its length, with gas extending to the rectum. Small amount of retained contrast within the rectum, status post attempt at Gastrografin enema. Large fecal stool burden. Similar degree of gas within the mid abdomen within small bowel. Chronic fracture at the left femoral neck again demonstrated. Osteopenia. Degenerative changes of the spine. IMPRESSION: Unchanged appearance of significant colonic gaseous distention, with large fecal burden. Electronically Signed   By: Gilmer Mor D.O.   On: 01/27/2018 08:52   Dg Colon W/water Sol Cm  Result Date: 01/26/2018 CLINICAL DATA:  Ov syndrome.  Constipation.  Recent surgery. EXAM: COLON WITH WATER SOLUTION CONTRAST COMPARISON:  02/26/2018.  CT 01/23/2018. FINDINGS: Water-soluble contrast entered per rectum. Only a small amount of contrast could be administered as the patient could not hold the contrast. Again noted are bilateral inguinal  hernias with diffuse bowel distention. Although the bowel distention may be related adynamic ileus related to the patient's recent surgery obstruction from bilateral hernias cannot be entirely excluded. IMPRESSION: 1.  Bilateral inguinal hernias.  Diffuse bowel distention. 2. Patient could not hold contrast for water-soluble barium enema. Findings  discussed with the patient's physician. Electronically Signed   By: Maisie Fus  Register   On: 01/26/2018 11:33   Medications:  Scheduled Meds: . bisacodyl  10 mg Rectal Daily  . buPROPion  150 mg Oral BID  . diclofenac  75 mg Oral BID  . DULoxetine  60 mg Oral Daily  . enoxaparin  40 mg Subcutaneous Q24H  . lactulose  20 g Oral BID  . lisinopril  20 mg Oral Daily  . magnesium citrate  1 Bottle Oral Once  . methylnaltrexone  12 mg Subcutaneous Once  . nicotine  21 mg Transdermal Daily  . polyethylene glycol  17 g Oral Daily  . pravastatin  40 mg Oral q1800  . senna-docusate  2 tablet Oral QHS   Continuous Infusions: PRN Meds:.alum & mag hydroxide-simeth, bisacodyl, methocarbamol, ondansetron (ZOFRAN) IV, oxyCODONE, oxyCODONE, promethazine, traZODone   Assessment: Active Problems:   Large bowel obstruction (HCC)   Hernia of abdominal wall   Ogilvie's syndrome    Plan: Recommend continued close follow-up with surgery due to bilateral inguinal hernias noted on imaging, and further management as per surgery  Large fecal stool burden noted on x-ray, and gas also reported within small bowel  His recent orthopedic surgery requiring narcotics is likely led to bowel suspension  Minimize narcotics Consider methylnaltrexone if okay with surgery.  Recommend discontinuing all laxatives prior to administration.  Max of 1 dose every 24 hours.  Surgery had recommended neostigmine.  If methylnaltrexone does not lead to improvement in symptoms, consider neostigmine.  If okay with surgery, could also consider rectal tube and NG tube for decompression.  Colonoscopy in the setting of small bowel distention, colonic distention, and large fecal burden, would have high risks, therefore, would recommend conservative management as above first.  Ambulation as tolerated, and position changes in bed can also help improve all distention, and with encourage patient to activities as tolerated.   LOS: 4  days   Melodie Bouillon, MD 01/27/2018, 9:58 AM

## 2018-01-27 NOTE — Progress Notes (Signed)
Rectal tube inserted per MD order   

## 2018-01-27 NOTE — Progress Notes (Signed)
SURGICAL PROGRESS NOTE   Hospital Day(s): 4.   Post op day(s):  Marland Kitchen   Interval History: Patient seen and examined, no acute events or new complaints overnight. Patient reports passing few gasses but still feels distended. Refers is trying to decrease pain medications. Denies nausea or vomiting.  Vital signs in last 24 hours: [min-max] current  Temp:  [97.5 F (36.4 C)-98.5 F (36.9 C)] 97.5 F (36.4 C) (10/19 1206) Pulse Rate:  [81-94] 82 (10/19 1206) Resp:  [18] 18 (10/19 0438) BP: (113-121)/(73-78) 121/78 (10/19 1206) SpO2:  [91 %-94 %] 93 % (10/19 1206)     Height: 5\' 11"  (180.3 cm) Weight: 90.8 kg BMI (Calculated): 27.92   Physical Exam:  Constitutional: alert, cooperative and no distress  Gastrointestinal: soft, non-tender, and distended and tympanic. No guarding or rebound.   Labs:  CBC Latest Ref Rng & Units 01/26/2018 01/25/2018 01/24/2018  WBC 4.0 - 10.5 K/uL 16.8(H) 17.9(H) 15.2(H)  Hemoglobin 13.0 - 17.0 g/dL 10.5(L) 12.2(L) 10.0(L)  Hematocrit 39.0 - 52.0 % 31.9(L) 36.6(L) 30.4(L)  Platelets 150 - 400 K/uL 513(H) 611(H) 533(H)   CMP Latest Ref Rng & Units 01/27/2018 01/26/2018 01/25/2018  Glucose 70 - 99 mg/dL 295(A) 213(Y) 865(H)  BUN 8 - 23 mg/dL 84(O) 96(E) 95(M)  Creatinine 0.61 - 1.24 mg/dL 8.41 3.24 4.01  Sodium 135 - 145 mmol/L 136 136 133(L)  Potassium 3.5 - 5.1 mmol/L 3.8 3.3(L) 3.4(L)  Chloride 98 - 111 mmol/L 104 104 103  CO2 22 - 32 mmol/L 24 23 21(L)  Calcium 8.9 - 10.3 mg/dL 8.2(L) 8.5(L) 8.7(L)  Total Protein 6.5 - 8.1 g/dL - - 6.4(L)  Total Bilirubin 0.3 - 1.2 mg/dL - - 0.7  Alkaline Phos 38 - 126 U/L - - 87  AST 15 - 41 U/L - - 16  ALT 0 - 44 U/L - - 17    Imaging studies:  EXAM: ABDOMEN - 2 VIEW  COMPARISON:  CT 01/23/2018, plain film 01/24/2018, 01/26/2018  FINDINGS: Persistently dilated colon throughout its length, with gas extending to the rectum. Small amount of retained contrast within the rectum, status post attempt at  Gastrografin enema.  Large fecal stool burden. Similar degree of gas within the mid abdomen within small bowel.  Chronic fracture at the left femoral neck again demonstrated. Osteopenia. Degenerative changes of the spine.  IMPRESSION: Unchanged appearance of significant colonic gaseous distention, with large fecal burden.   Electronically Signed   By: Gilmer Mor D.O.   On: 01/27/2018 08:52  Assessment/Plan:  67 y.o. male with colonic distention. Due to his recent history of orthopaedic surgery large bowel ileus vs colonic pseudo obstruction is the most likely cause. Bilateral inguina hernias are noted but not the cause of the large bowel distention.   Management of the large bowel distention at this moment should be medical and not surgical. Agree to follow all recommendations by GI service regarding the management of large bowel distention. There is no contraindication from patient's hernia stand point to use methylnaltrexone, neostigmine, or rectal tube or any treatment recommended for colonic pseudo obstruction as recommended by GI.   Gae Gallop, MD

## 2018-01-28 ENCOUNTER — Inpatient Hospital Stay: Payer: Medicare PPO

## 2018-01-28 LAB — BASIC METABOLIC PANEL
Anion gap: 11 (ref 5–15)
BUN: 27 mg/dL — ABNORMAL HIGH (ref 8–23)
CALCIUM: 8.4 mg/dL — AB (ref 8.9–10.3)
CO2: 28 mmol/L (ref 22–32)
Chloride: 100 mmol/L (ref 98–111)
Creatinine, Ser: 0.93 mg/dL (ref 0.61–1.24)
GFR calc Af Amer: >60 mL/min (ref >60)
GLUCOSE: 111 mg/dL — AB (ref 70–99)
POTASSIUM: 4.3 mmol/L (ref 3.5–5.1)
Sodium: 139 mmol/L (ref 135–145)

## 2018-01-28 NOTE — Progress Notes (Signed)
SURGICAL PROGRESS NOTE   Hospital Day(s): 5.   Post op day(s):  Marland Kitchen   Interval History: Patient seen and examined, no acute events or new complaints overnight. Patient reports feeling a little bit better after "rectal tube" was placed. Refer has passed few gasses.  Vital signs in last 24 hours: [min-max] current  Temp:  [97.5 F (36.4 C)-98.1 F (36.7 C)] 98 F (36.7 C) (10/20 0437) Pulse Rate:  [81-83] 81 (10/20 0437) Resp:  [16-20] 20 (10/20 0437) BP: (118-121)/(76-82) 118/82 (10/20 0954) SpO2:  [93 %] 93 % (10/20 0437)     Height: 5\' 11"  (180.3 cm) Weight: 90.8 kg BMI (Calculated): 27.92    Physical Exam:  Constitutional: alert, cooperative and no distress  Respiratory: breathing non-labored at rest  Cardiovascular: regular rate and sinus rhythm  Gastrointestinal: soft, non-tender, and distended and tympanic.  Labs:  CBC Latest Ref Rng & Units 01/26/2018 01/25/2018 01/24/2018  WBC 4.0 - 10.5 K/uL 16.8(H) 17.9(H) 15.2(H)  Hemoglobin 13.0 - 17.0 g/dL 10.5(L) 12.2(L) 10.0(L)  Hematocrit 39.0 - 52.0 % 31.9(L) 36.6(L) 30.4(L)  Platelets 150 - 400 K/uL 513(H) 611(H) 533(H)   CMP Latest Ref Rng & Units 01/28/2018 01/27/2018 01/26/2018  Glucose 70 - 99 mg/dL 161(W) 960(A) 540(J)  BUN 8 - 23 mg/dL 81(X) 91(Y) 78(G)  Creatinine 0.61 - 1.24 mg/dL 9.56 2.13 0.86  Sodium 135 - 145 mmol/L 139 136 136  Potassium 3.5 - 5.1 mmol/L 4.3 3.8 3.3(L)  Chloride 98 - 111 mmol/L 100 104 104  CO2 22 - 32 mmol/L 28 24 23   Calcium 8.9 - 10.3 mg/dL 5.7(Q) 4.6(N) 6.2(X)  Total Protein 6.5 - 8.1 g/dL - - -  Total Bilirubin 0.3 - 1.2 mg/dL - - -  Alkaline Phos 38 - 126 U/L - - -  AST 15 - 41 U/L - - -  ALT 0 - 44 U/L - - -    Imaging studies:  New xray today shows decreased distention but not completely resolved.    Assessment/Plan:  67 y.o. male with colonic distention. Due to his recent history of orthopaedic surgery large bowel ileus vs colonic pseudo obstruction is the most likely  cause. Bilateral inguina hernias are noted but not the cause of the large bowel distention.  Today doing a little better, first day that feels improvement. Xray shows some improvement. I recommend to continue current conservative management. Will follow clinically.   Gae Gallop, MD

## 2018-01-28 NOTE — Progress Notes (Signed)
[facility] [place] [facility]      PATIENT NAME: Alejandro Cook    MR#:  [id number]  DATE OF BIRTH:  September 19, 1950  SUBJECTIVE:   Patient's abdominal pain in his distention has improved since yesterday.  Rectal tube was placed and patient is having soft mushy stools.  No nausea or vomiting.  REVIEW OF SYSTEMS:    Review of Systems  Constitutional: Negative for chills and fever.  HENT: Negative for congestion and tinnitus.   Eyes: Negative for blurred vision and double vision.  Respiratory: Negative for cough, shortness of breath and wheezing.   Cardiovascular: Negative for chest pain, orthopnea and PND.  Gastrointestinal: Positive for abdominal pain. Negative for diarrhea, nausea and vomiting.       Abdominal distension.   Genitourinary: Negative for dysuria and hematuria.  Neurological: Negative for dizziness, sensory change and focal weakness.  All other systems reviewed and are negative.   Nutrition: Clear Liquid Tolerating Diet: Yes Tolerating PT: Eval noted.   DRUG ALLERGIES:  No Known Allergies  VITALS:  Blood pressure 121/85, pulse 81, temperature 98.7 F (37.1 C), temperature source Oral, resp. rate 16, height 5\' 11"  (1.803 m), weight 90.8 kg, SpO2 97 %.  PHYSICAL EXAMINATION:   Physical Exam  GENERAL:  67 y.o.-year-old patient lying in bed in no acute distress.  EYES: Pupils equal, round, reactive to light and accommodation. No scleral icterus. Extraocular muscles intact.  HEENT: Head atraumatic, normocephalic. Oropharynx and nasopharynx clear.  NECK:  Supple, no jugular venous distention. No thyroid enlargement, no tenderness.  LUNGS: Normal breath sounds bilaterally, no wheezing, rales, rhonchi. No use of accessory muscles of respiration.  CARDIOVASCULAR: S1, S2 normal. No murmurs, rubs, or gallops.  ABDOMEN: Soft, NT, slightly distended, Hyperactive BS. No organomegaly or mass.  EXTREMITIES: No cyanosis, clubbing or edema b/l.   LLE in a  Immobilizer NEUROLOGIC: Cranial nerves II through XII are intact. No focal Motor or sensory deficits b/l.  Globally weak PSYCHIATRIC: The patient is alert and oriented x 3.  SKIN: No obvious rash, lesion, or ulcer.    LABORATORY PANEL:   CBC Recent Labs  Lab 01/26/18 1031  WBC 16.8*  HGB 10.5*  HCT 31.9*  PLT 513*   ------------------------------------------------------------------------------------------------------------------  Chemistries  Recent Labs  Lab 01/25/18 1057  01/28/18 0502  NA 133*   < > 139  K 3.4*   < > 4.3  CL 103   < > 100  CO2 21*   < > 28  GLUCOSE 130*   < > 111*  BUN 24*   < > 27*  CREATININE 0.92   < > 0.93  CALCIUM 8.7*   < > 8.4*  MG 2.2  --   --   AST 16  --   --   ALT 17  --   --   ALKPHOS 87  --   --   BILITOT 0.7  --   --    < > = values in this interval not displayed.   ------------------------------------------------------------------------------------------------------------------  Cardiac Enzymes No results for input(s): TROPONINI in the last 168 hours. ------------------------------------------------------------------------------------------------------------------  RADIOLOGY:  Dg Abd 2 Views  Result Date: 01/27/2018 CLINICAL DATA:  67 year old male with a history of postop ileus EXAM: ABDOMEN - 2 VIEW COMPARISON:  CT 01/23/2018, plain film 01/24/2018, 01/26/2018 FINDINGS: Persistently dilated colon throughout its length, with gas extending to the rectum. Small amount of retained contrast within the rectum, status post attempt at Gastrografin enema. Large fecal stool  burden. Similar degree of gas within the mid abdomen within small bowel. Chronic fracture at the left femoral neck again demonstrated. Osteopenia. Degenerative changes of the spine. IMPRESSION: Unchanged appearance of significant colonic gaseous distention, with large fecal burden. Electronically Signed   By: Gilmer Mor D.O.   On: 01/27/2018 08:52     ASSESSMENT  AND PLAN:   67 year old male with past medical history of hypertension, hyperlipidemia, depression, recent fall with left tibial and fibular fracture status post ORIF who presented to the hospital due to abdominal pain and distention and noted to have colonic ileus/severe constipation.  1.  Abdominal pain-secondary to severe constipation/colonic ileus.  Patient's had abdominal x-ray yesterday morning also showing significant gaseous distention of his colon with significant fecal burden. - Status post rectal tube placement yesterday and given mag citrate and laxatives continued.  Patient feels a little bit better today and abdomen is less distended and patient is having soft mushy stools to the rectal tube. -Appreciate GI and surgical input and continue supportive care for now.  Continue laxatives.  No nausea or vomiting.  X-rays this morning showing some improvement in the gaseous distention.  2.  Status post recent fall with left fibular and tibial fracture status post ORIF-continue pain control with oxycodone but would minimize pain meds given his colonic ileus/obstipation/constipation.  3.  Hyperlipidemia-continue Pravachol.  4.  Essential hypertension-continue lisinopril.  5.  Depression-continue Wellbutrin, Cymbalta.  Likely discharge to short-term rehab when ileus/abdominal distention and pain has improved.  All the records are reviewed and case discussed with Care Management/Social Worker. Management plans discussed with the patient, family and they are in agreement.  CODE STATUS: Full code  DVT Prophylaxis: Lovenox  TOTAL TIME TAKING CARE OF THIS PATIENT: 30 minutes.   POSSIBLE D/C IN 2-3 DAYS, DEPENDING ON CLINICAL CONDITION.   Houston Siren M.D on 01/28/2018 at 12:37 PM  Between 7am to 6pm - Pager - [phone]  After 6pm go to [email or web address] - Social research officer, government  [facility]  Office  [phone]  CC: Primary care physician; Leanna Sato, MD

## 2018-01-28 NOTE — Progress Notes (Signed)
Patient's rectal tube was leaking. Assessed and added 15 more cc to tube to equal the total of 45cc to ensure adequate placement.

## 2018-01-28 NOTE — Progress Notes (Signed)
Physical Therapy Treatment Patient Details Name: Alejandro Cook MRN: [id number] DOB: [birth year] Today's Date: 01/28/2018    History of Present Illness Pt is a 67 y/o M s/p fall with resultant L tibial and fibular fx.  Pt is now s/p ORIF.  Pt was then d/c to home and returned due to bowel obstruction and inguinal hernia. 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     PT Comments    Patient required donning of new Knee Immobilizer and doffing of old soiled one. Transition required x2 assistance with nurse and PT lifting and removing in sync for least pain increase as possible. Patient unable to straighten leg fully and consistently moved into ER, causing Mod A to retain neutral alignment for donning of new immobilizer. Patient educated on need for neutral knee position however did not demonstrate compliance. Will benefit from continual fit checks for knee immobilizer to ensure placement has not rotated due to patient's preferred positioning. Patient will continue to benefit from skilled physical therapy to reduce pain, improve mobility, and decrease fall risk. Will benefit from placement at SNF due to current functional limitations.    Follow Up Recommendations  SNF     Equipment Recommendations  None recommended by PT    Recommendations for Other Services       Precautions / Restrictions Precautions Precautions: Fall Required Braces or Orthoses: Knee Immobilizer - Left Knee Immobilizer - Left: On at all times Restrictions Weight Bearing Restrictions: No LLE Weight Bearing: Touchdown weight bearing Other Position/Activity Restrictions: Dr. Martha Clan verbally updated WB status to TTWB for improved balance on 10/9 at 15:00.     Mobility  Bed Mobility                  Transfers                    Ambulation/Gait                 Stairs             Wheelchair Mobility    Modified Rankin (Stroke Patients Only)        Balance Overall balance assessment: Needs assistance;History of Falls Sitting-balance support: Single extremity supported;Feet supported Sitting balance-Leahy Scale: Poor Sitting balance - Comments: Pt relies on at least 1UE support while sitting EOB   Standing balance support: Bilateral upper extremity supported;During functional activity Standing balance-Leahy Scale: Poor Standing balance comment: Pt relies on BUE support standing statically at bedside and while hopping                            Cognition Arousal/Alertness: Awake/alert Behavior During Therapy: WFL for tasks assessed/performed Overall Cognitive Status: Within Functional Limits for tasks assessed                                        Exercises Other Exercises Other Exercises: placement of new knee immobilizer x2 for doffing of old soiled KI, and donning of new KI.  Other Exercises: supine: ankle pumps 15x, AAROM SLR LLE Other Exercises: Pt. educated on need for wearing knee immobilizer, importance of keeping LLE straight, non compliant frequently ER hip and flexing knee.     General Comments        Pertinent Vitals/Pain Pain Assessment: 0-10 Pain Score: 7 (after placement  of KI) Pain Location: LLE Pain Descriptors / Indicators: Stabbing Pain Intervention(s): Monitored during session    Home Living                      Prior Function            PT Goals (current goals can now be found in the care plan section) Acute Rehab PT Goals Patient Stated Goal: to go to rehab PT Goal Formulation: With patient Time For Goal Achievement: 02/07/18 Potential to Achieve Goals: Fair Progress towards PT goals: Not progressing toward goals - comment(fatigue and pain )    Frequency    7X/week      PT Plan Current plan remains appropriate    Co-evaluation              AM-PAC PT "6 Clicks" Daily Activity  Outcome Measure  Difficulty turning over in bed (including  adjusting bedclothes, sheets and blankets)?: Unable Difficulty moving from lying on back to sitting on the side of the bed? : Unable Difficulty sitting down on and standing up from a chair with arms (e.g., wheelchair, bedside commode, etc,.)?: Unable Help needed moving to and from a bed to chair (including a wheelchair)?: A Lot Help needed walking in hospital room?: Total Help needed climbing 3-5 steps with a railing? : Total 6 Click Score: 7    End of Session Equipment Utilized During Treatment: Gait belt;Left knee immobilizer Activity Tolerance: Patient limited by fatigue;Patient limited by pain Patient left: in bed;with call bell/phone within reach;with bed alarm set;Other (comment)(with pillow between LEs to avoid excessive L hip adduction) Nurse Communication: Mobility status;Other (comment);Weight bearing status(RN made aware of irritation from [facility]) PT Visit Diagnosis: Pain;Unsteadiness on feet (R26.81);Other abnormalities of gait and mobility (R26.89);Muscle weakness (generalized) (M62.81);History of falling (Z91.81);Difficulty in walking, not elsewhere classified (R26.2) Pain - Right/Left: Left Pain - part of body: Leg     Time: 9147-8295 PT Time Calculation (min) (ACUTE ONLY): 23 min  Charges:  $Therapeutic Exercise: 8-22 mins $Self Care/Home Management: 8-22                     Precious Bard, PT, DPT     Precious Bard 01/28/2018, 12:16 PM

## 2018-01-28 NOTE — Progress Notes (Signed)
   Melodie Bouillon, MD [address], [place], [place], [address] [address], [place], [place], [address] Phone: [phone]  Fax: [phone]   Subjective: Patient reports clinical improvement after rectal tube placement.  Was passing gas even prior to rectal tube placement.   Objective: Exam: Vital signs in last 24 hours: Vitals:   01/27/18 1206 01/27/18 1950 01/28/18 0437 01/28/18 0954  BP: 121/78 121/76 120/77 118/82  Pulse: 82 83 81   Resp:  16 20   Temp: (!) 97.5 F (36.4 C) 98.1 F (36.7 C) 98 F (36.7 C)   TempSrc: Oral     SpO2: 93% 93% 93%   Weight:      Height:       Weight change:   Intake/Output Summary (Last 24 hours) at 01/28/2018 1200 Last data filed at 01/28/2018 0800 Gross per 24 hour  Intake 240 ml  Output 600 ml  Net -360 ml    General: No acute distress, AAO x3 Abd: Soft, NT/ND, No HSM Skin: Warm, no rashes Neck: Supple, Trachea midline   Lab Results: Lab Results  Component Value Date   WBC 16.8 (H) 01/26/2018   HGB 10.5 (L) 01/26/2018   HCT 31.9 (L) 01/26/2018   MCV 96.1 01/26/2018   PLT 513 (H) 01/26/2018   Micro Results: No results found for this or any previous visit (from the past 240 hour(s)). Studies/Results: Dg Abd 2 Views  Result Date: 01/27/2018 CLINICAL DATA:  67 year old male with a history of postop ileus EXAM: ABDOMEN - 2 VIEW COMPARISON:  CT 01/23/2018, plain film 01/24/2018, 01/26/2018 FINDINGS: Persistently dilated colon throughout its length, with gas extending to the rectum. Small amount of retained contrast within the rectum, status post attempt at Gastrografin enema. Large fecal stool burden. Similar degree of gas within the mid abdomen within small bowel. Chronic fracture at the left femoral neck again demonstrated. Osteopenia. Degenerative changes of the spine. IMPRESSION: Unchanged appearance of significant colonic gaseous distention, with large fecal burden. Electronically Signed   By:  Gilmer Mor D.O.   On: 01/27/2018 08:52   Medications:  Scheduled Meds: . bisacodyl  10 mg Rectal Daily  . buPROPion  150 mg Oral BID  . diclofenac  75 mg Oral BID  . DULoxetine  60 mg Oral Daily  . enoxaparin  40 mg Subcutaneous Q24H  . lactulose  20 g Oral BID  . lisinopril  20 mg Oral Daily  . methylnaltrexone  12 mg Subcutaneous Once  . nicotine  21 mg Transdermal Daily  . polyethylene glycol  17 g Oral Daily  . pravastatin  40 mg Oral q1800  . senna-docusate  2 tablet Oral QHS   Continuous Infusions: PRN Meds:.alum & mag hydroxide-simeth, bisacodyl, methocarbamol, ondansetron (ZOFRAN) IV, oxyCODONE, oxyCODONE, promethazine, zolpidem   Assessment: Active Problems:   Large bowel obstruction (HCC)   Hernia of abdominal wall   Ogilvie's syndrome    Plan: Surgery following Rectal tube placed by primary team yesterday with improvement clinically Follow-up pending x-ray from today Rectal tube with brown stool output Consider methylnaltrexone or neostigmine if patient does not continue to improve  Patient will be followed by Dr. Tobi Bastos for the week and was signed out to him.   LOS: 5 days   Melodie Bouillon, MD 01/28/2018, 12:00 PM

## 2018-01-29 DIAGNOSIS — R1084 Generalized abdominal pain: Secondary | ICD-10-CM

## 2018-01-29 DIAGNOSIS — K567 Ileus, unspecified: Secondary | ICD-10-CM

## 2018-01-29 LAB — CBC
HEMATOCRIT: 29.8 % — AB (ref 39.0–52.0)
HEMOGLOBIN: 9.7 g/dL — AB (ref 13.0–17.0)
MCH: 32.1 pg (ref 26.0–34.0)
MCHC: 32.6 g/dL (ref 30.0–36.0)
MCV: 98.7 fL (ref 80.0–100.0)
NRBC: 0.1 % (ref 0.0–0.2)
Platelets: 511 10*3/uL — ABNORMAL HIGH (ref 150–400)
RBC: 3.02 MIL/uL — ABNORMAL LOW (ref 4.22–5.81)
RDW: 13.6 % (ref 11.5–15.5)
WBC: 15.5 10*3/uL — AB (ref 4.0–10.5)

## 2018-01-29 MED ORDER — ACETAMINOPHEN 325 MG PO TABS
650.0000 mg | ORAL_TABLET | Freq: Four times a day (QID) | ORAL | Status: DC | PRN
  Administered 2018-01-30 – 2018-01-31 (×3): 650 mg via ORAL
  Filled 2018-01-29 (×3): qty 2

## 2018-01-29 NOTE — Care Management Important Message (Signed)
Copy of signed IM left with patient in room.  

## 2018-01-29 NOTE — Progress Notes (Signed)
Physical Therapy Treatment Patient Details Name: Alejandro Cook MRN: [id number] DOB: Aug 17, 1950 Today's Date: 01/29/2018    History of Present Illness Pt is a 67 y/o M s/p fall with resultant L tibial and fibular fx.  Pt is now s/p ORIF.  Pt was then d/c to home and returned due to bowel obstruction and inguinal hernia. Pt now with rectal tube/pouch.  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     PT Comments    Alejandro Cook made good progress with mobility this session.  Prior to OOB activity KI was repositioned and pt instructed in proper positioning of LLE when in bed or in chair (RN notified at end of session).  Pt required assist to boost to standing from elevated bed and assist to remain steady when ambulating short distance to chair.  He does require directional cues with ambulation and assist due to poorly controlled descent to chair to sit. SNF remains most appropriate d/c plan at this time.       Follow Up Recommendations  SNF     Equipment Recommendations  None recommended by PT    Recommendations for Other Services       Precautions / Restrictions Precautions Precautions: Fall Precaution Comments: rectal tube/pouch Required Braces or Orthoses: Knee Immobilizer - Left Knee Immobilizer - Left: On at all times Restrictions Weight Bearing Restrictions: Yes LLE Weight Bearing: Touchdown weight bearing    Mobility  Bed Mobility Overal bed mobility: Needs Assistance Bed Mobility: Supine to Sit     Supine to sit: Min assist;HOB elevated     General bed mobility comments: Pt was able to perform supine>sit with less assistance this session.  Bed pad was utilized to prevent amount of scooting due to rectal tube  Transfers Overall transfer level: Needs assistance Equipment used: Rolling walker (2 wheeled) Transfers: Sit to/from Stand Sit to Stand: +2 physical assistance;From elevated surface;Min assist         General transfer comment: Pt  requires cues for proper hand placement and assist to boost to standing and to control descent to chair  Ambulation/Gait Ambulation/Gait assistance: Mod assist;+2 physical assistance Gait Distance (Feet): 5 Feet Assistive device: Rolling walker (2 wheeled) Gait Pattern/deviations: (hop on RLE)     General Gait Details: Pt hopped from bed to chair with cues to ensure he has his balance before taking each hop.  Assist provided to stabilize and directional cues provided.    Stairs             Wheelchair Mobility    Modified Rankin (Stroke Patients Only)       Balance Overall balance assessment: Needs assistance;History of Falls Sitting-balance support: Single extremity supported;Feet supported Sitting balance-Leahy Scale: Poor Sitting balance - Comments: Pt relies on at least 1UE support while sitting EOB   Standing balance support: Bilateral upper extremity supported;During functional activity Standing balance-Leahy Scale: Poor Standing balance comment: Pt relies on BUE support standing statically at bedside and while hopping                            Cognition Arousal/Alertness: Awake/alert Behavior During Therapy: WFL for tasks assessed/performed Overall Cognitive Status: Within Functional Limits for tasks assessed  Exercises General Exercises - Lower Extremity Straight Leg Raises: AROM;Left;10 reps;Supine;AAROM    General Comments General comments (skin integrity, edema, etc.): Noted that pt's KI was rotated upon PT arrival and was too low.  Re-positioned L KI with pt assisting by performing L SLRs and holding leg off bed with assist.       Pertinent Vitals/Pain Pain Assessment: Faces Faces Pain Scale: Hurts even more Pain Location: LLE  Pain Descriptors / Indicators: Aching;Grimacing;Guarding;Spasm Pain Intervention(s): Limited activity within patient's tolerance;Monitored during  session;Repositioned    Home Living                      Prior Function            PT Goals (current goals can now be found in the care plan section) Acute Rehab PT Goals Patient Stated Goal: to be able to improve his sit>stand PT Goal Formulation: With patient Time For Goal Achievement: 02/07/18 Potential to Achieve Goals: Fair Progress towards PT goals: Progressing toward goals    Frequency    7X/week      PT Plan Current plan remains appropriate    Co-evaluation              AM-PAC PT "6 Clicks" Daily Activity  Outcome Measure  Difficulty turning over in bed (including adjusting bedclothes, sheets and blankets)?: Unable Difficulty moving from lying on back to sitting on the side of the bed? : Unable Difficulty sitting down on and standing up from a chair with arms (e.g., wheelchair, bedside commode, etc,.)?: Unable Help needed moving to and from a bed to chair (including a wheelchair)?: A Lot Help needed walking in hospital room?: A Lot Help needed climbing 3-5 steps with a railing? : Total 6 Click Score: 8    End of Session Equipment Utilized During Treatment: Gait belt;Left knee immobilizer Activity Tolerance: Patient limited by fatigue;Patient limited by pain Patient left: with call bell/phone within reach;Other (comment);in chair;with chair alarm set;with nursing/sitter in room(with pillow between LEs to avoid excessive L hip adduction) Nurse Communication: Mobility status;Other (comment)(proper positioning of LLE in bed and chair) PT Visit Diagnosis: Pain;Unsteadiness on feet (R26.81);Other abnormalities of gait and mobility (R26.89);Muscle weakness (generalized) (M62.81);History of falling (Z91.81);Difficulty in walking, not elsewhere classified (R26.2) Pain - Right/Left: Left Pain - part of body: Leg     Time: 4098-1191 PT Time Calculation (min) (ACUTE ONLY): 34 min  Charges:  $Therapeutic Activity: 8-22 mins $Self Care/Home Management:  8-22                     Encarnacion Chu PT, DPT 01/29/2018, 1:58 PM

## 2018-01-29 NOTE — Clinical Social Work Note (Signed)
CSW spoke with April RN CM at [facility] this morning and she was unable to tell me why the auth process was delayed on their end. She is going to review and call CSW back. York Spaniel MSW,LCSW [phone]

## 2018-01-29 NOTE — Clinical Social Work Note (Signed)
Alejandro Cook with [facility] provided auth for patient to go to [facility]: [id number], 541 therapy minutes per week, speech therapy and nursing. Next review date is 10/23. [facility] will need to send updated clinical to [facility] at [phone]. CSW spoke with physician and he will not discharge patient today as he is not medically stable. York Spaniel MSW,LCSW [phone]

## 2018-01-29 NOTE — Progress Notes (Signed)
[name] , MD [address], [place], [place], [address] [address], [address], [place], [place], [address] Phone: [phone]  Fax: [phone]   Alejandro Cook is being followed for small bowel ileus and constipation.  Subjective:  Feels much better, no abdominal pain, less distension , passing lot of stool.    Objective: Vital signs in last 24 hours: Vitals:   01/28/18 1226 01/28/18 1922 01/29/18 0454 01/29/18 0924  BP: 121/85 134/73 125/86 131/78  Pulse: 81 80 73   Resp: 16 16 20    Temp: 98.7 F (37.1 C) 98.5 F (36.9 C) 97.7 F (36.5 C)   TempSrc: Oral Oral Oral   SpO2: 97% 96% 93%   Weight:      Height:       Weight change:   Intake/Output Summary (Last 24 hours) at 01/29/2018 1025 Last data filed at 01/29/2018 5784 Gross per 24 hour  Intake -  Output 880 ml  Net -880 ml     Exam: Heart:: Regular rate and rhythm, S1S2 present or without murmur or extra heart sounds Lungs: normal, clear to auscultation and clear to auscultation and percussion Abdomen: soft, nontender, normal bowel sounds   Lab Results: @LABTEST2 @ Micro Results: No results found for this or any previous visit (from the past 240 hour(s)). Studies/Results: Dg Abd 2 Views  Result Date: 01/28/2018 CLINICAL DATA:  Pseudo-obstruction of colon. EXAM: ABDOMEN - 2 VIEW COMPARISON:  Radiographs of January 27, 2018. FINDINGS: There appears to be slightly decreased colonic dilatation compared to prior exam, although small bowel dilatation is also seen. These findings are most consistent with ileus. Vascular calcifications are noted. IMPRESSION: Slightly decreased large bowel dilatation is noted, with continued small bowel dilatation. These findings are most consistent with ileus. Electronically Signed   By: Lupita Raider, M.D.   On: 01/28/2018 14:46   Medications: I have reviewed the patient's current medications. Scheduled Meds: . bisacodyl  10 mg Rectal Daily  . buPROPion  150  mg Oral BID  . diclofenac  75 mg Oral BID  . DULoxetine  60 mg Oral Daily  . enoxaparin  40 mg Subcutaneous Q24H  . lactulose  20 g Oral BID  . lisinopril  20 mg Oral Daily  . methylnaltrexone  12 mg Subcutaneous Once  . nicotine  21 mg Transdermal Daily  . polyethylene glycol  17 g Oral Daily  . pravastatin  40 mg Oral q1800  . senna-docusate  2 tablet Oral QHS   Continuous Infusions: PRN Meds:.alum & mag hydroxide-simeth, bisacodyl, methocarbamol, ondansetron (ZOFRAN) IV, oxyCODONE, oxyCODONE, promethazine, zolpidem   Assessment: Active Problems:   Large bowel obstruction (HCC)   Hernia of abdominal wall   Ogilvie's syndrome  Alejandro Cook 67 y.o. male had a recent orthopedic surgery and subsequently has been admitted for small bowel ileus and possible large bowel ileus.  The CT abdomen on 01/23/2018 showed mildly dilated descending and right colon with transition point seen at the distal descending colon but enters a moderate left inguinal hernia, there is stool in the downstream sigmoid and rectum suggesting only a partial obstruction.  Large right inguinal hernia containing distended stool filled cecum as well as terminal ileum and a normal appendix. On 01/26/2018 had a Gastrografin enema which showed bilateral inguinal hernias ,diffuse bowel distention.  X-ray abdomen performed on 01/28/2018 showed slightly decreased large bowel dilation with continued small bowel dilation features suggestive of an ileus.     Plan: 1.  As  long as he is passing gas and having bowel movements will continue present management if not passing gas or bowel movements, flush the rectal tube and probably given 1 or 2 enemas to make sure there is no distal fecal retention which would permit the proximal contents to empty out easily.  Serial abdominal exams of the abdomen and  if  distention worsens ,repeat imaging would be warranted.  Monitor electrolytes and normalize if low, avoid narcotics.  I see that  there are orders for methylnaltrexone and oxycodone I would probably hold off on the oxycodone and use Tylenol for pain relief while we get his bowel movements moving.  Continue daily MiraLAX.  Can consider D/c rectal tube tomorrow    LOS: 6 days   [name], MD 01/29/2018, 10:25 AM

## 2018-01-29 NOTE — Progress Notes (Signed)
[facility] Progress Note     Subjective: Patient is resting comfortably in bed this morning. He notes that he continues to feel improved since rectal decompression. No complaints of abdominal pain, nausea, or emesis. Has been tolerating a clear liquid diet. Stool in rectal tube. Working with PT.   Objective: Vital signs in last 24 hours: Temp:  [97.7 F (36.5 C)-98.7 F (37.1 C)] 97.7 F (36.5 C) (10/21 0454) Pulse Rate:  [73-81] 73 (10/21 0454) Resp:  [16-20] 20 (10/21 0454) BP: (118-134)/(73-86) 125/86 (10/21 0454) SpO2:  [93 %-97 %] 93 % (10/21 0454) Last BM Date: 01/28/18  Intake/Output from previous day: 10/20 0701 - 10/21 0700 In: -  Out: 900 [Urine:300; Stool:600] Intake/Output this shift: No intake/output data recorded.  PE: Gen:  Alert, NAD, pleasant Card:  Regular rate and rhythm Pulm:  Normal effort, clear to auscultation bilaterally Abd: Soft, non-tender, mild distention, tympanic in upper abdomen Skin: warm and dry, no rashes  Psych: A&Ox3    Lab Results:  Recent Labs    01/26/18 1031 01/29/18 0742  WBC 16.8* 15.5*  HGB 10.5* 9.7*  HCT 31.9* 29.8*  PLT 513* 511*   BMET Recent Labs    01/27/18 0427 01/28/18 0502  NA 136 139  K 3.8 4.3  CL 104 100  CO2 24 28  GLUCOSE 105* 111*  BUN 25* 27*  CREATININE 1.02 0.93  CALCIUM 8.2* 8.4*   PT/INR No results for input(s): LABPROT, INR in the last 72 hours. CMP     Component Value Date/Time   NA 139 01/28/2018 0502   NA 139 04/15/2014 1601   K 4.3 01/28/2018 0502   K 4.3 04/15/2014 1601   CL 100 01/28/2018 0502   CL 104 04/15/2014 1601   CO2 28 01/28/2018 0502   CO2 30 04/15/2014 1601   GLUCOSE 111 (H) 01/28/2018 0502   GLUCOSE 97 04/15/2014 1601   BUN 27 (H) 01/28/2018 0502   BUN 10 04/15/2014 1601   CREATININE 0.93 01/28/2018 0502   CREATININE 0.80 04/15/2014 1601   CALCIUM 8.4 (L) 01/28/2018 0502   CALCIUM 9.0 04/15/2014 1601   PROT 6.4 (L) 01/25/2018 1057   ALBUMIN 3.2 (L) 01/25/2018 1057   AST 16 01/25/2018 1057   ALT 17 01/25/2018 1057   ALKPHOS 87 01/25/2018 1057   BILITOT 0.7 01/25/2018 1057   GFRNONAA >60 01/28/2018 0502   GFRNONAA >60 04/15/2014 1601   GFRAA >60 01/28/2018 0502   GFRAA >60 04/15/2014 1601   Lipase  No results found for: LIPASE     Studies/Results: Dg Abd 2 Views  Result Date: 01/28/2018 CLINICAL DATA:  Pseudo-obstruction of colon. EXAM: ABDOMEN - 2 VIEW COMPARISON:  Radiographs of January 27, 2018. FINDINGS: There appears to be slightly decreased colonic dilatation compared to prior exam, although small bowel dilatation is also seen. These findings are most consistent with ileus. Vascular calcifications are noted. IMPRESSION: Slightly decreased large bowel dilatation is noted, with continued small bowel dilatation. These findings are most consistent with ileus. Electronically Signed   By: Lupita Raider, M.D.   On: 01/28/2018 14:46    Anti-infectives: Anti-infectives (From admission, onward)   None       Assessment/Plan  Ogilvies Syndrome  Alejandro Cook is a 67 y.o. 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   - Appreciate GI involvement and rectal decompression, patients abdomen appears improved. Continue management per GI.  - Again, chronic inguinal hernias are reducible and not source of patients colonic dilation. No indication for emergent surgical intervention.  - On clear liquids.  - Monitor ongoing bowel function and serial abdominal exams - IVF for maintenance - Pain control (minimize narcotics)  - Mobilization/Physical Therapy as tolerates, likely benefit from SNF at discharge.  - Medicine Primary   LOS: 6 days    Lynden Oxford , [facility] 01/29/2018, 8:38 AM [phone] M-F: 7am - 4pm

## 2018-01-29 NOTE — Progress Notes (Signed)
[facility] [place] [facility]      PATIENT NAME: Alejandro Cook    MR#:  [id number]  DATE OF BIRTH:  12-02-1950  SUBJECTIVE:   Abdominal distention his pain has improved.  Patient still having some soft mushy stools through his rectal tube.  Tolerating clear liquid diet and will advance to full liquids today.  REVIEW OF SYSTEMS:    Review of Systems  Constitutional: Negative for chills and fever.  HENT: Negative for congestion and tinnitus.   Eyes: Negative for blurred vision and double vision.  Respiratory: Negative for cough, shortness of breath and wheezing.   Cardiovascular: Negative for chest pain, orthopnea and PND.  Gastrointestinal: Positive for abdominal pain. Negative for diarrhea, nausea and vomiting.       Abdominal distension.   Genitourinary: Negative for dysuria and hematuria.  Neurological: Negative for dizziness, sensory change and focal weakness.  All other systems reviewed and are negative.   Nutrition: Full liquids Tolerating Diet: Yes Tolerating PT: Eval noted.   DRUG ALLERGIES:  No Known Allergies  VITALS:  Blood pressure 131/78, pulse 73, temperature 97.7 F (36.5 C), temperature source Oral, resp. rate 20, height 5\' 11"  (1.803 m), weight 90.8 kg, SpO2 93 %.  PHYSICAL EXAMINATION:   Physical Exam  GENERAL:  67 y.o.-year-old patient sitting in chair in no acute distress.  EYES: Pupils equal, round, reactive to light and accommodation. No scleral icterus. Extraocular muscles intact.  HEENT: Head atraumatic, normocephalic. Oropharynx and nasopharynx clear.  NECK:  Supple, no jugular venous distention. No thyroid enlargement, no tenderness.  LUNGS: Normal breath sounds bilaterally, no wheezing, rales, rhonchi. No use of accessory muscles of respiration.  CARDIOVASCULAR: S1, S2 normal. No murmurs, rubs, or gallops.  ABDOMEN: Soft, NT, distended but improved, Hyperactive BS. No organomegaly or mass.  EXTREMITIES: No cyanosis,  clubbing or edema b/l.   LLE in a Immobilizer NEUROLOGIC: Cranial nerves II through XII are intact. No focal Motor or sensory deficits b/l.  Globally weak PSYCHIATRIC: The patient is alert and oriented x 3.  SKIN: No obvious rash, lesion, or ulcer.    LABORATORY PANEL:   CBC Recent Labs  Lab 01/29/18 0742  WBC 15.5*  HGB 9.7*  HCT 29.8*  PLT 511*   ------------------------------------------------------------------------------------------------------------------  Chemistries  Recent Labs  Lab 01/25/18 1057  01/28/18 0502  NA 133*   < > 139  K 3.4*   < > 4.3  CL 103   < > 100  CO2 21*   < > 28  GLUCOSE 130*   < > 111*  BUN 24*   < > 27*  CREATININE 0.92   < > 0.93  CALCIUM 8.7*   < > 8.4*  MG 2.2  --   --   AST 16  --   --   ALT 17  --   --   ALKPHOS 87  --   --   BILITOT 0.7  --   --    < > = values in this interval not displayed.   ------------------------------------------------------------------------------------------------------------------  Cardiac Enzymes No results for input(s): TROPONINI in the last 168 hours. ------------------------------------------------------------------------------------------------------------------  RADIOLOGY:  Dg Abd 2 Views  Result Date: 01/28/2018 CLINICAL DATA:  Pseudo-obstruction of colon. EXAM: ABDOMEN - 2 VIEW COMPARISON:  Radiographs of January 27, 2018. FINDINGS: There appears to be slightly decreased colonic dilatation compared to prior exam, although small bowel dilatation is also seen. These findings are most consistent with ileus. Vascular calcifications are noted. IMPRESSION:  Slightly decreased large bowel dilatation is noted, with continued small bowel dilatation. These findings are most consistent with ileus. Electronically Signed   By: Lupita Raider, M.D.   On: 01/28/2018 14:46     ASSESSMENT AND PLAN:   67 year old male with past medical history of hypertension, hyperlipidemia, depression, recent fall with  left tibial and fibular fracture status post ORIF who presented to the hospital due to abdominal pain and distention and noted to have colonic ileus/severe constipation.  1.  Abdominal pain-secondary to severe constipation/colonic ileus.  - Status post rectal tube placement and cont. Lactulose, Senna and pt. Is improving.   - Feels better and having soft mushy stools in the Rectal tube.  No N/V.  -Appreciate GI and surgical input and continue supportive care for now.  - will repeat Abdominal films in a.m. Tomorrow.   2.  Status post recent fall with left fibular and tibial fracture status post ORIF-continue pain control with oxycodone but would minimize pain meds given his colonic ileus/obstipation/constipation. - cont. PT as tolerated and will need SNF/STR upon discharge.   3.  Hyperlipidemia-continue Pravachol.  4.  Essential hypertension-continue lisinopril.  5.  Depression-continue Wellbutrin, Cymbalta.  6. Tobacco abuse - cont. Nicotine patch.   All the records are reviewed and case discussed with Care Management/Social Worker. Management plans discussed with the patient, family and they are in agreement.  CODE STATUS: Full code  DVT Prophylaxis: Lovenox  TOTAL TIME TAKING CARE OF THIS PATIENT: 30 minutes.   POSSIBLE D/C IN 2-3 DAYS, DEPENDING ON CLINICAL CONDITION.   Houston Siren M.D on 01/29/2018 at 1:43 PM  Between 7am to 6pm - Pager - [phone]  After 6pm go to [email or web address] - Social research officer, government  [facility]  Office  [phone]  CC: Primary care physician; Leanna Sato, MD

## 2018-01-30 ENCOUNTER — Inpatient Hospital Stay: Payer: Medicare PPO

## 2018-01-30 DIAGNOSIS — K5669 Other partial intestinal obstruction: Principal | ICD-10-CM

## 2018-01-30 LAB — CBC
HEMATOCRIT: 29.9 % — AB (ref 39.0–52.0)
Hemoglobin: 9.6 g/dL — ABNORMAL LOW (ref 13.0–17.0)
MCH: 31.6 pg (ref 26.0–34.0)
MCHC: 32.1 g/dL (ref 30.0–36.0)
MCV: 98.4 fL (ref 80.0–100.0)
NRBC: 0 % (ref 0.0–0.2)
Platelets: 539 10*3/uL — ABNORMAL HIGH (ref 150–400)
RBC: 3.04 MIL/uL — ABNORMAL LOW (ref 4.22–5.81)
RDW: 13.7 % (ref 11.5–15.5)
WBC: 14.3 10*3/uL — AB (ref 4.0–10.5)

## 2018-01-30 NOTE — Progress Notes (Signed)
Physical Therapy Treatment Patient Details Name: Alejandro Cook MRN: [id number] DOB: 08-Nov-1950 Today's Date: 01/30/2018    History of Present Illness Pt is a 67 y/o M s/p fall with resultant L tibial and fibular fx.  Pt is now s/p ORIF.  Pt was then d/c to home and returned due to bowel obstruction and inguinal hernia. Pt now with rectal tube/pouch.  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     PT Comments    Agrees to session.  To edge of bed with min assist.  Pt stood with mod a x 2.  Stood  X 1 minute.  After seated rest, he was able to take 3 very unsteady hops along edge of bed before sitting.  He does maintain TTWB but is unsafe to attempt without +2 assist.  Rectal tube remains.  Stated he was happy to be out of bed yesterday but it was uncomfortable due to rectal tube and requested to return to bed.     Follow Up Recommendations  SNF     Equipment Recommendations  None recommended by PT    Recommendations for Other Services       Precautions / Restrictions Precautions Precautions: Fall Precaution Comments: rectal tube/pouch Required Braces or Orthoses: Knee Immobilizer - Left Knee Immobilizer - Left: On at all times Restrictions Weight Bearing Restrictions: Yes LLE Weight Bearing: Touchdown weight bearing Other Position/Activity Restrictions: Dr. Martha Clan verbally updated WB status to TTWB for improved balance on 10/9 at 15:00.     Mobility  Bed Mobility Overal bed mobility: Needs Assistance Bed Mobility: Supine to Sit     Supine to sit: Min assist;HOB elevated Sit to supine: Mod assist;+2 for physical assistance      Transfers Overall transfer level: Needs assistance Equipment used: Rolling walker (2 wheeled) Transfers: Sit to/from Stand Sit to Stand: +2 physical assistance;From elevated surface;Min assist;Mod assist            Ambulation/Gait Ambulation/Gait assistance: Mod assist;+2 physical assistance   Assistive  device: Rolling walker (2 wheeled) Gait Pattern/deviations: Step-to pattern Gait velocity: decreased   General Gait Details: sidestepped x 3 along bed   Stairs             Wheelchair Mobility    Modified Rankin (Stroke Patients Only)       Balance Overall balance assessment: Needs assistance;History of Falls Sitting-balance support: Single extremity supported;Feet supported Sitting balance-Leahy Scale: Poor Sitting balance - Comments: Pt relies on at least 1UE support while sitting EOB   Standing balance support: Bilateral upper extremity supported;During functional activity Standing balance-Leahy Scale: Poor Standing balance comment: Pt relies on BUE support standing statically at bedside and while hopping                            Cognition Arousal/Alertness: Awake/alert Behavior During Therapy: WFL for tasks assessed/performed Overall Cognitive Status: Within Functional Limits for tasks assessed                                        Exercises      General Comments        Pertinent Vitals/Pain Pain Assessment: 0-10 Pain Score: 7  Pain Location: LLE  Pain Descriptors / Indicators: Aching;Grimacing;Guarding    Home Living  Prior Function            PT Goals (current goals can now be found in the care plan section) Progress towards PT goals: Progressing toward goals    Frequency    7X/week      PT Plan Current plan remains appropriate    Co-evaluation              AM-PAC PT "6 Clicks" Daily Activity  Outcome Measure  Difficulty turning over in bed (including adjusting bedclothes, sheets and blankets)?: Unable Difficulty moving from lying on back to sitting on the side of the bed? : Unable Difficulty sitting down on and standing up from a chair with arms (e.g., wheelchair, bedside commode, etc,.)?: Unable Help needed moving to and from a bed to chair (including a wheelchair)?:  A Lot Help needed walking in hospital room?: A Lot Help needed climbing 3-5 steps with a railing? : Total 6 Click Score: 8    End of Session Equipment Utilized During Treatment: Gait belt;Left knee immobilizer Activity Tolerance: Patient limited by fatigue;Patient limited by pain Patient left: with call bell/phone within reach;Other (comment);in chair;with chair alarm set;with nursing/sitter in room   Pain - Right/Left: Left Pain - part of body: Leg     Time: 1191-4782 PT Time Calculation (min) (ACUTE ONLY): 20 min  Charges:  $Therapeutic Activity: 8-22 mins                    Danielle Dess, PTA 01/30/18, 12:29 PM

## 2018-01-30 NOTE — Progress Notes (Signed)
[name] , MD [address], [address], [place], [place], [address] [address], [address], [place], [place], [address] Phone: [phone]  Fax: [phone]   BOBIE KISTLER is being followed for ileus  Subjective: He feels better, almost at baseline, no abdominal pain or distension, passing gas, was having his lunch when I entered.    Objective: Vital signs in last 24 hours: Vitals:   01/29/18 1446 01/29/18 1942 01/30/18 0417 01/30/18 1024  BP: 120/77 109/67 132/78 134/79  Pulse: 75 78 70 79  Resp: 14 16 20 16   Temp: 98.1 F (36.7 C) 98.1 F (36.7 C) 98 F (36.7 C) 97.9 F (36.6 C)  TempSrc: Oral Oral Oral Oral  SpO2: 94% 97% 96% 95%  Weight:      Height:       Weight change:   Intake/Output Summary (Last 24 hours) at 01/30/2018 1304 Last data filed at 01/30/2018 1028 Gross per 24 hour  Intake 160 ml  Output 900 ml  Net -740 ml     Exam: Heart:: Regular rate and rhythm, S1S2 present or without murmur or extra heart sounds Lungs: normal, clear to auscultation and clear to auscultation and percussion Abdomen: soft, nontender, normal bowel sounds   Lab Results: @LABTEST2 @ Micro Results: No results found for this or any previous visit (from the past 240 hour(s)). Studies/Results: Dg Abd 2 Views  Result Date: 01/30/2018 CLINICAL DATA:  67 year old male with abdominal pain and distension. Large scrotal hernia containing small and large bowel. Rectal tube in place. EXAM: ABDOMEN - 2 VIEW COMPARISON:  01/28/2018 and earlier. FINDINGS: Upright and supine views of the abdomen and pelvis. No pneumoperitoneum. Patchy left lung base opacity appears stable. Increased colonic gas in the large left scrotal hernia since the prior CT. Similar appearance of upstream gas distended large bowel. Gas-filled small bowel loops have regressed since 01/28/2018. Upper limits of normal to mildly dilated residual in the mid abdomen measuring 36 millimeters diameter. Stable visualized  osseous structures. IMPRESSION: 1. No free air. 2. Improved bowel-gas pattern since 01/28/2018 with less gas distended small bowel. Continued gas-filled colon which could reflect ileus or partial obstruction due to the large scrotal hernia. Electronically Signed   By: Odessa Fleming M.D.   On: 01/30/2018 09:50   Medications: I have reviewed the patient's current medications. Scheduled Meds: . bisacodyl  10 mg Rectal Daily  . buPROPion  150 mg Oral BID  . diclofenac  75 mg Oral BID  . DULoxetine  60 mg Oral Daily  . enoxaparin  40 mg Subcutaneous Q24H  . lactulose  20 g Oral BID  . lisinopril  20 mg Oral Daily  . methylnaltrexone  12 mg Subcutaneous Once  . nicotine  21 mg Transdermal Daily  . polyethylene glycol  17 g Oral Daily  . pravastatin  40 mg Oral q1800   Continuous Infusions: PRN Meds:.acetaminophen, alum & mag hydroxide-simeth, bisacodyl, methocarbamol, ondansetron (ZOFRAN) IV, oxyCODONE, oxyCODONE, promethazine, zolpidem   Assessment: Active Problems:   Large bowel obstruction (HCC)   Hernia of abdominal wall   Ogilvie's syndrome   Ileus (HCC) Felton Clinton 67 y.o. male had a recent orthopedic surgery and subsequently has been admitted for small bowel ileus and possible large bowel ileus.  The CT abdomen on 01/23/2018 showed mildly dilated descending and right colon with transition point seen at the distal descending colon but enters a moderate left inguinal hernia, there is stool in the downstream sigmoid and rectum suggesting only a  partial obstruction.  Large right inguinal hernia containing distended stool filled cecum as well as terminal ileum and a normal appendix. On 01/26/2018 had a Gastrografin enema which showed bilateral inguinal hernias ,diffuse bowel distention.  X-ray abdomen performed on 01/28/2018 showed slightly decreased large bowel dilation with continued small bowel dilation features suggestive of an ileus. X ray today shows improved small bowel .      Plan: 1.  clinically he is much better- continue lactulose and miralax, if complains of abdominal distension and pain , insert rectal tube and obtain imaging.   2. Once having bowel movements , no nausea, vomiting and passing gas then can go home. Surgery follow up for repair of hernias . Limit use of narcotics.    LOS: 7 days   [name], MD 01/30/2018, 1:04 PM

## 2018-01-30 NOTE — Progress Notes (Addendum)
[facility] - [place] at [facility]      PATIENT NAME: Alejandro Cook    MR#:  [id number]  DATE OF BIRTH:  Sep 01, 1950  SUBJECTIVE:   Patient's abdomen is less distended, patient still having some soft stools to his rectal tube.  Tolerating full liquid diet well.  Abdominal x-ray this morning showing some improvement in his small bowel distention but he still has some colonic distention.  REVIEW OF SYSTEMS:    Review of Systems  Constitutional: Negative for chills and fever.  HENT: Negative for congestion and tinnitus.   Eyes: Negative for blurred vision and double vision.  Respiratory: Negative for cough, shortness of breath and wheezing.   Cardiovascular: Negative for chest pain, orthopnea and PND.  Gastrointestinal: Negative for abdominal pain, diarrhea, nausea and vomiting.       Abdominal distension.   Genitourinary: Negative for dysuria and hematuria.  Neurological: Negative for dizziness, sensory change and focal weakness.  All other systems reviewed and are negative.   Nutrition: Soft diet Tolerating Diet: Yes Tolerating PT: Eval noted.   DRUG ALLERGIES:  No Known Allergies  VITALS:  Blood pressure 134/79, pulse 79, temperature 97.9 F (36.6 C), temperature source Oral, resp. rate 16, height 5\' 11"  (1.803 m), weight 90.8 kg, SpO2 95 %.  PHYSICAL EXAMINATION:   Physical Exam  GENERAL:  67 y.o.-year-old patient sitting in chair in no acute distress.  EYES: Pupils equal, round, reactive to light and accommodation. No scleral icterus. Extraocular muscles intact.  HEENT: Head atraumatic, normocephalic. Oropharynx and nasopharynx clear.  NECK:  Supple, no jugular venous distention. No thyroid enlargement, no tenderness.  LUNGS: Normal breath sounds bilaterally, no wheezing, rales, rhonchi. No use of accessory muscles of respiration.  CARDIOVASCULAR: S1, S2 normal. No murmurs, rubs, or gallops.  ABDOMEN: Soft, NT, distended but improved, Hypoactive BS.  No organomegaly or mass.  EXTREMITIES: No cyanosis, clubbing or edema b/l.   LLE in a Immobilizer NEUROLOGIC: Cranial nerves II through XII are intact. No focal Motor or sensory deficits b/l.  Globally weak PSYCHIATRIC: The patient is alert and oriented x 3.  SKIN: No obvious rash, lesion, or ulcer.    LABORATORY PANEL:   CBC Recent Labs  Lab 01/30/18 0628  WBC 14.3*  HGB 9.6*  HCT 29.9*  PLT 539*   ------------------------------------------------------------------------------------------------------------------  Chemistries  Recent Labs  Lab 01/25/18 1057  01/28/18 0502  NA 133*   < > 139  K 3.4*   < > 4.3  CL 103   < > 100  CO2 21*   < > 28  GLUCOSE 130*   < > 111*  BUN 24*   < > 27*  CREATININE 0.92   < > 0.93  CALCIUM 8.7*   < > 8.4*  MG 2.2  --   --   AST 16  --   --   ALT 17  --   --   ALKPHOS 87  --   --   BILITOT 0.7  --   --    < > = values in this interval not displayed.   ------------------------------------------------------------------------------------------------------------------  Cardiac Enzymes No results for input(s): TROPONINI in the last 168 hours. ------------------------------------------------------------------------------------------------------------------  RADIOLOGY:  Dg Abd 2 Views  Result Date: 01/30/2018 CLINICAL DATA:  67 year old male with abdominal pain and distension. Large scrotal hernia containing small and large bowel. Rectal tube in place. EXAM: ABDOMEN - 2 VIEW COMPARISON:  01/28/2018 and earlier. FINDINGS: Upright and supine views of the abdomen and  pelvis. No pneumoperitoneum. Patchy left lung base opacity appears stable. Increased colonic gas in the large left scrotal hernia since the prior CT. Similar appearance of upstream gas distended large bowel. Gas-filled small bowel loops have regressed since 01/28/2018. Upper limits of normal to mildly dilated residual in the mid abdomen measuring 36 millimeters diameter. Stable  visualized osseous structures. IMPRESSION: 1. No free air. 2. Improved bowel-gas pattern since 01/28/2018 with less gas distended small bowel. Continued gas-filled colon which could reflect ileus or partial obstruction due to the large scrotal hernia. Electronically Signed   By: Odessa Fleming M.D.   On: 01/30/2018 09:50     ASSESSMENT AND PLAN:   67 year old male with past medical history of hypertension, hyperlipidemia, depression, recent fall with left tibial and fibular fracture status post ORIF who presented to the hospital due to abdominal pain and distention and noted to have colonic ileus/severe constipation.  1.  Abdominal pain-secondary to severe constipation/colonic ileus.  - Status post rectal tube placement and cont. Lactulose, Senna and pt. Is improving.  -Patient continues to have soft stools, but denies any nausea vomiting.  Abdominal distention is improved.  Abdominal x-ray this morning showing improved bowel gas pattern with less gas in the small bowel but still continued gas filled colon.  Discussed with GI and general surgery and will DC rectal tube, advance diet. 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    2.  Status post recent fall with left fibular and tibial fracture status post ORIF-continue pain control with oxycodone but would minimize pain meds given his colonic ileus/obstipation/constipation. - cont. PT as tolerated and will need SNF/STR upon discharge.   3.  Hyperlipidemia-continue Pravachol.  4.  Essential hypertension-continue lisinopril.  5.  Depression-continue Wellbutrin, Cymbalta.  6. Tobacco abuse - cont. Nicotine patch.   Likely discharge to rehab once ileus has improved.    All the records are reviewed and case discussed with Care Management/Social Worker. Management plans discussed with the patient, family and they are in agreement.  CODE STATUS:  Full code  DVT Prophylaxis: Lovenox  TOTAL TIME TAKING CARE OF THIS PATIENT: 30 minutes.   POSSIBLE D/C IN 1-2 DAYS, DEPENDING ON CLINICAL CONDITION.   Houston Siren M.D on 01/30/2018 at 1:08 PM  Between 7am to 6pm - Pager - [phone]  After 6pm go to [email or web address] - Social research officer, government  [facility] [place] [facility]  Office  [phone]  CC: Primary care physician; Leanna Sato, MD

## 2018-01-31 ENCOUNTER — Encounter: Payer: Self-pay | Admitting: *Deleted

## 2018-01-31 DIAGNOSIS — K567 Ileus, unspecified: Secondary | ICD-10-CM

## 2018-01-31 LAB — BASIC METABOLIC PANEL
Anion gap: 6 (ref 5–15)
BUN: 12 mg/dL (ref 8–23)
CHLORIDE: 105 mmol/L (ref 98–111)
CO2: 24 mmol/L (ref 22–32)
Calcium: 8 mg/dL — ABNORMAL LOW (ref 8.9–10.3)
Creatinine, Ser: 0.79 mg/dL (ref 0.61–1.24)
GFR calc Af Amer: >60 mL/min (ref >60)
GFR calc non Af Amer: >60 mL/min (ref >60)
GLUCOSE: 106 mg/dL — AB (ref 70–99)
Potassium: 4.2 mmol/L (ref 3.5–5.1)
Sodium: 135 mmol/L (ref 135–145)

## 2018-01-31 LAB — CBC
HCT: 30.1 % — ABNORMAL LOW (ref 39.0–52.0)
Hemoglobin: 9.8 g/dL — ABNORMAL LOW (ref 13.0–17.0)
MCH: 31.6 pg (ref 26.0–34.0)
MCHC: 32.6 g/dL (ref 30.0–36.0)
MCV: 97.1 fL (ref 80.0–100.0)
NRBC: 0 % (ref 0.0–0.2)
Platelets: 541 10*3/uL — ABNORMAL HIGH (ref 150–400)
RBC: 3.1 MIL/uL — ABNORMAL LOW (ref 4.22–5.81)
RDW: 13.7 % (ref 11.5–15.5)
WBC: 12.5 10*3/uL — AB (ref 4.0–10.5)

## 2018-01-31 MED ORDER — LISINOPRIL 20 MG PO TABS: 20.0000 mg | ORAL_TABLET | Freq: Every day | ORAL | Status: DC

## 2018-01-31 MED ORDER — OXYCODONE HCL 5 MG PO TABS: 5.0000 mg | ORAL_TABLET | Freq: Four times a day (QID) | ORAL | 0 refills | Status: DC | PRN

## 2018-01-31 MED ORDER — LOVASTATIN 40 MG PO TABS: 40.0000 mg | ORAL_TABLET | Freq: Every day | ORAL | Status: AC

## 2018-01-31 MED ORDER — LACTULOSE 10 GM/15ML PO SOLN: 20.0000 g | Freq: Two times a day (BID) | ORAL | 0 refills | Status: DC

## 2018-01-31 MED ORDER — POLYETHYLENE GLYCOL 3350 17 G PO PACK: 17.0000 g | PACK | Freq: Every day | ORAL | 0 refills | Status: DC

## 2018-01-31 MED ORDER — ACETAMINOPHEN 325 MG PO TABS: 650.0000 mg | ORAL_TABLET | Freq: Four times a day (QID) | ORAL | Status: DC | PRN

## 2018-01-31 MED ORDER — LACTULOSE 10 GM/15ML PO SOLN: 20.0000 g | Freq: Two times a day (BID) | ORAL | 0 refills | Status: DC | PRN

## 2018-01-31 MED ORDER — ZOLPIDEM TARTRATE 5 MG PO TABS: 5.0000 mg | ORAL_TABLET | Freq: Every evening | ORAL | 0 refills | Status: DC | PRN

## 2018-01-31 NOTE — Progress Notes (Signed)
[name] , MD [address], [address], [place], [place], [address] [address], [place], [place], [address] Phone: [phone]  Fax: [phone]   Alejandro Cook is being followed for ileus of small bowel  Subjective: Feels well, no abdominal pain, passing gas and had a bowel movement .    Objective: Vital signs in last 24 hours: Vitals:   01/30/18 1024 01/30/18 1323 01/30/18 2156 01/31/18 0504  BP: 134/79 120/85 135/87 (!) 138/91  Pulse: 79 73 81 73  Resp: 16 16 18 18   Temp: 97.9 F (36.6 C) 98.1 F (36.7 C) 98.1 F (36.7 C) 97.7 F (36.5 C)  TempSrc: Oral Oral Oral Oral  SpO2: 95% 94% 95% 96%  Weight:      Height:       Weight change:   Intake/Output Summary (Last 24 hours) at 01/31/2018 0908 Last data filed at 01/31/2018 0504 Gross per 24 hour  Intake 240 ml  Output 475 ml  Net -235 ml     Exam: Heart:: Regular rate and rhythm, S1S2 present or without murmur or extra heart sounds Lungs: normal, clear to auscultation and clear to auscultation and percussion Abdomen: soft, nontender, normal bowel sounds   Lab Results: @LABTEST2 @ Micro Results: No results found for this or any previous visit (from the past 240 hour(s)). Studies/Results: Dg Abd 2 Views  Result Date: 01/30/2018 CLINICAL DATA:  67 year old male with abdominal pain and distension. Large scrotal hernia containing small and large bowel. Rectal tube in place. EXAM: ABDOMEN - 2 VIEW COMPARISON:  01/28/2018 and earlier. FINDINGS: Upright and supine views of the abdomen and pelvis. No pneumoperitoneum. Patchy left lung base opacity appears stable. Increased colonic gas in the large left scrotal hernia since the prior CT. Similar appearance of upstream gas distended large bowel. Gas-filled small bowel loops have regressed since 01/28/2018. Upper limits of normal to mildly dilated residual in the mid abdomen measuring 36 millimeters diameter. Stable visualized osseous structures.  IMPRESSION: 1. No free air. 2. Improved bowel-gas pattern since 01/28/2018 with less gas distended small bowel. Continued gas-filled colon which could reflect ileus or partial obstruction due to the large scrotal hernia. Electronically Signed   By: Odessa Fleming M.D.   On: 01/30/2018 09:50   Medications: I have reviewed the patient's current medications. Scheduled Meds: . bisacodyl  10 mg Rectal Daily  . buPROPion  150 mg Oral BID  . diclofenac  75 mg Oral BID  . DULoxetine  60 mg Oral Daily  . enoxaparin  40 mg Subcutaneous Q24H  . lactulose  20 g Oral BID  . lisinopril  20 mg Oral Daily  . methylnaltrexone  12 mg Subcutaneous Once  . nicotine  21 mg Transdermal Daily  . polyethylene glycol  17 g Oral Daily  . pravastatin  40 mg Oral q1800   Continuous Infusions: PRN Meds:.acetaminophen, alum & mag hydroxide-simeth, bisacodyl, methocarbamol, ondansetron (ZOFRAN) IV, oxyCODONE, oxyCODONE, promethazine, zolpidem   Assessment: Active Problems:   Large bowel obstruction (HCC)   Hernia of abdominal wall   Ogilvie's syndrome   Ileus (HCC)  Alejandro Gibney Sharpe66 y.o.malehad a recent orthopedic surgery and subsequently has been admitted for small bowel ileus and possible large bowel ileus. The CTabdomen on 01/23/2018 showed mildly dilated descending and right colon with transition point seen at the distal descending colon but enters a moderate left inguinal hernia, there is stool in the downstream sigmoid and rectum suggesting only a partial obstruction. Largeright inguinal hernia containing distended  stool filled cecum as well as terminal ileum and a normal appendix.On 01/26/2018 had a Gastrografin enema which showed bilateral inguinal hernias,diffuse bowel distention. X-ray abdomen performed on 01/28/2018 showed slightly decreased large bowel dilation with continued small bowel dilation features suggestive of an ileus.X ray today shows improved small bowel .     Plan: 1.Improving-  continue with miralax at home and PRN lactulose   I will sign off.  Please call me if any further GI concerns or questions.  We would like to thank you for the opportunity to participate in the care of Alejandro Cook.      LOS: 8 days   [name], MD 01/31/2018, 9:08 AM

## 2018-01-31 NOTE — Clinical Social Work Placement (Signed)
   CLINICAL SOCIAL WORK PLACEMENT  NOTE  Date:  01/31/2018  Patient Details  Name: Alejandro Cook MRN: [id number] Date of Birth: 1950/10/05  Clinical Social Work is seeking post-discharge placement for this patient at the Skilled  Nursing Facility level of care (*CSW will initial, date and re-position this form in  chart as items are completed):  Yes   Patient/family provided with [place] Clinical Social Work Department's list of facilities offering this level of care within the geographic area requested by the patient (or if unable, by the patient's family).  Yes   Patient/family informed of their freedom to choose among providers that offer the needed level of care, that participate in Medicare, Medicaid or managed care program needed by the patient, have an available bed and are willing to accept the patient.  Yes   Patient/family informed of [place] [facility]'s ownership interest in [facility] and [facility], as well as of the fact that they are under no obligation to receive care at these facilities.  PASRR submitted to EDS on       PASRR number received on       Existing PASRR number confirmed on 01/24/18     FL2 transmitted to all facilities in geographic area requested by pt/family on 01/24/18     FL2 transmitted to all facilities within larger geographic area on       Patient informed that his/her managed care company has contracts with or will negotiate with certain facilities, including the following:        Yes   Patient/family informed of bed offers received.  Patient chooses bed at ([facility])     Physician recommends and patient chooses bed at [facility])    Patient to be transferred to ([facility]) on 01/31/18.  Patient to be transferred to facility by (EMS)     Patient family notified on 01/31/18 of transfer.  Name of family member notified:  (patient alert and oriented X4; message left for his son)     PHYSICIAN       Additional Comment:     _______________________________________________ York Spaniel, LCSW 01/31/2018, 1:34 PM

## 2018-01-31 NOTE — Discharge Summary (Signed)
[facility] - [place] at [facility]   PATIENT NAME: Alejandro Cook    MR#:  [id number]  DATE OF BIRTH:  June 14, 1950  DATE OF ADMISSION:  01/23/2018 ADMITTING PHYSICIAN: Altamese Dilling, MD  DATE OF DISCHARGE: 01/31/2018  PRIMARY CARE PHYSICIAN: Leanna Sato, MD    ADMISSION DIAGNOSIS:  Dehydration [E86.0] Bowel obstruction (HCC) [K56.609] Generalized abdominal pain [R10.84] Post-op pain [G89.18] Other partial intestinal obstruction (HCC) [K56.690]  DISCHARGE DIAGNOSIS:  Active Problems:   Large bowel obstruction (HCC)   Hernia of abdominal wall   Ogilvie's syndrome   Ileus (HCC)   SECONDARY DIAGNOSIS:   Past Medical History:  Diagnosis Date  . Depression   . Hyperlipidemia   . Hypertension     HOSPITAL COURSE:   67 year old male with past medical history of hypertension, hyperlipidemia, depression, recent fall with left tibial and fibular fracture status post ORIF who presented to the hospital due to abdominal pain and distention and noted to have colonic ileus/severe constipation.  1.  Abdominal pain-secondary to severe constipation/colonic ileus.  -Patient was initially treated with multiple enemas, stool softeners and was slow to improve.  A GI and surgical consult was obtained.  Patient also received Relistor given his opioid induced constipation. - After aggressive laxatives and having a rectal tube placed patient's abdominal distention and pain has improved.  Patient's abdominal x-rays also shows improvement in his small bowel distention, but he still has some colonic distention with bilateral inguinal hernias. - As per surgery these inguinal hernias are chronic and are reducible and are not incarcerated and do not require acute surgical intervention.  Patient has clinically improved with supportive therapy with laxatives and avoiding narcotics. -His rectal tube has been removed, he is having soft stools and his abdomen is less distended and  is therefore being discharged.  Patient will continue lactulose twice daily as needed along with MiraLAX daily.  Patient should limit his narcotic intake.  2.  Status post recent fall with left fibular and tibial fracture status post ORIF- patient is being discharged to a short-term rehab for ongoing physical therapy status post recent ORIF of a tibial fibular fracture.  He will limit his oxycodone intake for pain control given his ileus/obstipation.  He will also use some Tylenol and diclofenac gel and Robaxin for pain.  He should follow-up with orthopedics in the next week to 1 to 2 weeks with Dr. Martha Clan.  3.  Hyperlipidemia-pt. Will continue Pravachol.  4.  Essential hypertension- pt. Will continue lisinopril  5.  Depression- pt. Will continue Wellbutrin, Cymbalta.   DISCHARGE CONDITIONS:   Stable.   CONSULTS OBTAINED:  Treatment Team:  Signa Kell, MD Midge Minium, MD  DRUG ALLERGIES:  No Known Allergies  DISCHARGE MEDICATIONS:   Allergies as of 01/31/2018   No Known Allergies     Medication List    STOP taking these medications   lisinopril-hydrochlorothiazide 20-12.5 MG tablet Commonly known as:  PRINZIDE,ZESTORETIC     TAKE these medications   acetaminophen 325 MG tablet Commonly known as:  TYLENOL Take 2 tablets (650 mg total) by mouth every 6 (six) hours as needed for moderate pain (headache).   buPROPion 150 MG 12 hr tablet Commonly known as:  WELLBUTRIN SR Take 1 tablet by mouth 2 (two) times daily.   diclofenac 75 MG EC tablet Commonly known as:  VOLTAREN Take 1 tablet by mouth 2 (two) times daily.   DULoxetine 60 MG capsule Commonly known as:  CYMBALTA Take 1 capsule by  mouth daily.   enoxaparin 40 MG/0.4ML injection Commonly known as:  LOVENOX Inject 0.4 mLs (40 mg total) into the skin daily for 14 days.   lactulose 10 GM/15ML solution Commonly known as:  CHRONULAC Take 30 mLs (20 g total) by mouth 2 (two) times daily as needed for mild  constipation.   lisinopril 20 MG tablet Commonly known as:  PRINIVIL,ZESTRIL Take 1 tablet (20 mg total) by mouth daily. Start taking on:  02/01/2018   lovastatin 40 MG tablet Commonly known as:  MEVACOR Take 1 tablet (40 mg total) by mouth daily.   methocarbamol 500 MG tablet Commonly known as:  ROBAXIN Take 1 tablet (500 mg total) by mouth every 6 (six) hours as needed for muscle spasms.   oxyCODONE 5 MG immediate release tablet Commonly known as:  Oxy IR/ROXICODONE Take 1 tablet (5 mg total) by mouth every 6 (six) hours as needed for moderate pain. What changed:    medication strength  how much to take  when to take this  reasons to take this   polyethylene glycol packet Commonly known as:  MIRALAX / GLYCOLAX Take 17 g by mouth daily. Start taking on:  02/01/2018   zolpidem 5 MG tablet Commonly known as:  AMBIEN Take 1 tablet (5 mg total) by mouth at bedtime as needed for sleep.         DISCHARGE INSTRUCTIONS:   DIET:  Cardiac diet  DISCHARGE CONDITION:  Stable  ACTIVITY:  Activity as tolerated  OXYGEN:  Home Oxygen: No.   Oxygen Delivery: room air  DISCHARGE LOCATION:  nursing home   If you experience worsening of your admission symptoms, develop shortness of breath, life threatening emergency, suicidal or homicidal thoughts you must seek medical attention immediately by calling 911 or calling your MD immediately  if symptoms less severe.  You Must read complete instructions/literature along with all the possible adverse reactions/side effects for all the Medicines you take and that have been prescribed to you. Take any new Medicines after you have completely understood and accpet all the possible adverse reactions/side effects.   Please note  You were cared for by a hospitalist during your hospital stay. If you have any questions about your discharge medications or the care you received while you were in the hospital after you are discharged,  you can call the unit and asked to speak with the hospitalist on call if the hospitalist that took care of you is not available. Once you are discharged, your primary care physician will handle any further medical issues. Please note that NO REFILLS for any discharge medications will be authorized once you are discharged, as it is imperative that you return to your primary care physician (or establish a relationship with a primary care physician if you do not have one) for your aftercare needs so that they can reassess your need for medications and monitor your lab values.     Today   Rectal tube was removed yesterday, patient is having soft stools.  Abdominal distention is improved.  Tolerating a soft diet well, no nausea vomiting or worsening abdominal pain.  Patient does have some left hip pain secondary to his osteoarthritis.  He is being discharged to a skilled nursing facility today.  VITAL SIGNS:  Blood pressure 119/71, pulse 85, temperature 98 F (36.7 C), temperature source Oral, resp. rate 18, height 5\' 11"  (1.803 m), weight 90.8 kg, SpO2 94 %.  I/O:    Intake/Output Summary (Last 24 hours) at 01/31/2018  1258 Last data filed at 01/31/2018 0930 Gross per 24 hour  Intake 480 ml  Output 425 ml  Net 55 ml    PHYSICAL EXAMINATION:   GENERAL:  67 y.o.-year-old patient sitting in chair in no acute distress.  EYES: Pupils equal, round, reactive to light and accommodation. No scleral icterus. Extraocular muscles intact.  HEENT: Head atraumatic, normocephalic. Oropharynx and nasopharynx clear.  NECK:  Supple, no jugular venous distention. No thyroid enlargement, no tenderness.  LUNGS: Normal breath sounds bilaterally, no wheezing, rales, rhonchi. No use of accessory muscles of respiration.  CARDIOVASCULAR: S1, S2 normal. No murmurs, rubs, or gallops.  ABDOMEN: Soft, NT, distended but improved, + BS. No organomegaly or mass.  EXTREMITIES: No cyanosis, clubbing or edema b/l.   LLE in a  Immobilizer NEUROLOGIC: Cranial nerves II through XII are intact. No focal Motor or sensory deficits b/l.  Globally weak PSYCHIATRIC: The patient is alert and oriented x 3.  SKIN: No obvious rash, lesion, or ulcer.   GU:  Scrotal swelling due to b/l inguinal hernia's but reducible and not incarcerated.   DATA REVIEW:   CBC Recent Labs  Lab 01/31/18 0302  WBC 12.5*  HGB 9.8*  HCT 30.1*  PLT 541*    Chemistries  Recent Labs  Lab 01/25/18 1057  01/31/18 0302  NA 133*   < > 135  K 3.4*   < > 4.2  CL 103   < > 105  CO2 21*   < > 24  GLUCOSE 130*   < > 106*  BUN 24*   < > 12  CREATININE 0.92   < > 0.79  CALCIUM 8.7*   < > 8.0*  MG 2.2  --   --   AST 16  --   --   ALT 17  --   --   ALKPHOS 87  --   --   BILITOT 0.7  --   --    < > = values in this interval not displayed.    Cardiac Enzymes No results for input(s): TROPONINI in the last 168 hours.  Microbiology Results  Results for orders placed or performed during the hospital encounter of 01/14/18  Surgical PCR screen     Status: None   Collection Time: 01/15/18  2:34 PM  Result Value Ref Range Status   MRSA, PCR NEGATIVE NEGATIVE Final   Staphylococcus aureus NEGATIVE NEGATIVE Final    Comment: (NOTE) The Xpert SA Assay (FDA approved for NASAL specimens in patients 65 years of age and older), is one component of a comprehensive surveillance program. It is not intended to diagnose infection nor to guide or monitor treatment. Performed at [facility], [address], [place], [place] [address]     RADIOLOGY:  Cathleen Corti 2 Views  Result Date: 01/30/2018 CLINICAL DATA:  67 year old male with abdominal pain and distension. Large scrotal hernia containing small and large bowel. Rectal tube in place. EXAM: ABDOMEN - 2 VIEW COMPARISON:  01/28/2018 and earlier. FINDINGS: Upright and supine views of the abdomen and pelvis. No pneumoperitoneum. Patchy left lung base opacity appears stable. Increased colonic gas  in the large left scrotal hernia since the prior CT. Similar appearance of upstream gas distended large bowel. Gas-filled small bowel loops have regressed since 01/28/2018. Upper limits of normal to mildly dilated residual in the mid abdomen measuring 36 millimeters diameter. Stable visualized osseous structures. IMPRESSION: 1. No free air. 2. Improved bowel-gas pattern since 01/28/2018 with less gas distended small  bowel. Continued gas-filled colon which could reflect ileus or partial obstruction due to the large scrotal hernia. Electronically Signed   By: Odessa Fleming M.D.   On: 01/30/2018 09:50      Management plans discussed with the patient, family and they are in agreement.  CODE STATUS:     Code Status Orders  (From admission, onward)         Start     Ordered   01/23/18 2154  Full code  Continuous     01/23/18 2153          TOTAL TIME TAKING CARE OF THIS PATIENT: 40 minutes.    Houston Siren M.D on 01/31/2018 at 12:58 PM  Between 7am to 6pm - Pager - [phone]  After 6pm go to [email or web address] - Social research officer, government  [facility] [place] [facility]  Office  [phone]  CC: Primary care physician; Leanna Sato, MD

## 2018-01-31 NOTE — Care Management (Signed)
Barbara Cower with [facility] notified of disposition to [facility]

## 2018-01-31 NOTE — Care Management Important Message (Signed)
Copy of signed IM left with patient in room.  

## 2018-01-31 NOTE — Clinical Social Work Note (Signed)
CSW contacted [facility] and spoke with Caitlyn who verified patient's auth remains good through today. Physician is discharging patient today to [facility]. CSW has notified Tina at [facility] and sent discharge instructions. Patient to transport via EMS. CSW left HIPPA compliant message for patient's son. York Spaniel MSW,LCSW [phone]

## 2018-01-31 NOTE — Progress Notes (Signed)
Report called to [place], Charity fundraiser, at [facility].  IV removed without complication.  EMS notified for transport, currently 7th in line.

## 2018-01-31 NOTE — Progress Notes (Signed)
Physical Therapy Treatment Patient Details Name: Alejandro Cook MRN: [id number] DOB: [birth year] Today's Date: 01/31/2018    History of Present Illness Pt is a 67 y/o M s/p fall with resultant L tibial and fibular fx.  Pt is now s/p ORIF.  Pt was then d/c to home and returned due to bowel obstruction and inguinal hernia. 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     PT Comments    Alejandro Cook made good progress with mobility, spending majority of session practicing safety with ambulation/hopping.  Pt initially with LOB on first hopping attempt due to hopping too quickly consecutively before ensuring he is balance.  Discussed and demonstrated safe management of RW and slowing down which resulted in improved safety.  However, pt continues to require mod +2 assist with ambulation and min +2 assist for sit>stand transfers from an elevated surface.  SNF remains most appropriate d/c plan at this time.     Follow Up Recommendations  SNF     Equipment Recommendations  None recommended by PT    Recommendations for Other Services       Precautions / Restrictions Precautions Precautions: Fall Required Braces or Orthoses: Knee Immobilizer - Left Knee Immobilizer - Left: On at all times Restrictions Weight Bearing Restrictions: Yes LLE Weight Bearing: Touchdown weight bearing Other Position/Activity Restrictions: Dr. Martha Clan verbally updated WB status to TTWB for improved balance on 10/9 at 15:00.     Mobility  Bed Mobility Overal bed mobility: Needs Assistance Bed Mobility: Supine to Sit     Supine to sit: Min guard;HOB elevated     General bed mobility comments: Increased effort and time.    Transfers Overall transfer level: Needs assistance Equipment used: Rolling walker (2 wheeled) Transfers: Sit to/from Stand Sit to Stand: Min assist;+2 physical assistance;From elevated surface         General transfer comment: Bed elevated and assist to boost  to standing and to remain steady.  Assist for descent to chair.   Ambulation/Gait Ambulation/Gait assistance: Mod assist;+2 physical assistance Gait Distance (Feet): 10 Feet(1, 4, 5) Assistive device: Rolling walker (2 wheeled) Gait Pattern/deviations: (hop on RLE) Gait velocity: decreased   General Gait Details: On first attempt at hopping laterally the pt hops x2 consecutively and loses his balance requiring assist for descent to bed.  Pt then reminded of the importance of moving RW first, then hopping, then waiting several seconds before hopping again to decrease likelihood of LOB.  Pt hopped to the chair followed by a seated rest break and then practiced safe hopping forward and back in front of recliner with safe RW management.    Stairs             Wheelchair Mobility    Modified Rankin (Stroke Patients Only)       Balance Overall balance assessment: Needs assistance Sitting-balance support: Single extremity supported;Feet supported Sitting balance-Leahy Scale: Poor Sitting balance - Comments: Pt relies on at least 1UE support while sitting EOB   Standing balance support: Bilateral upper extremity supported;During functional activity Standing balance-Leahy Scale: Poor Standing balance comment: Pt relies on BUE support standing statically at bedside and while hopping                            Cognition Arousal/Alertness: Awake/alert Behavior During Therapy: WFL for tasks assessed/performed Overall Cognitive Status: Within Functional Limits for tasks assessed  Exercises      General Comments        Pertinent Vitals/Pain Pain Assessment: Faces Faces Pain Scale: Hurts even more Pain Location: LLE Pain Descriptors / Indicators: Spasm;Grimacing;Guarding;Moaning Pain Intervention(s): Limited activity within patient's tolerance;Monitored during session;Repositioned;Utilized relaxation techniques     Home Living                      Prior Function            PT Goals (current goals can now be found in the care plan section) Acute Rehab PT Goals Patient Stated Goal: to become as independent as possible so he can have his hip replaced PT Goal Formulation: With patient Time For Goal Achievement: 02/07/18 Potential to Achieve Goals: Fair Progress towards PT goals: Progressing toward goals    Frequency    7X/week      PT Plan Current plan remains appropriate    Co-evaluation              AM-PAC PT "6 Clicks" Daily Activity  Outcome Measure  Difficulty turning over in bed (including adjusting bedclothes, sheets and blankets)?: A Lot Difficulty moving from lying on back to sitting on the side of the bed? : A Lot Difficulty sitting down on and standing up from a chair with arms (e.g., wheelchair, bedside commode, etc,.)?: Unable Help needed moving to and from a bed to chair (including a wheelchair)?: A Lot Help needed walking in hospital room?: A Lot Help needed climbing 3-5 steps with a railing? : Total 6 Click Score: 10    End of Session Equipment Utilized During Treatment: Gait belt;Left knee immobilizer Activity Tolerance: Patient limited by pain;Patient limited by fatigue Patient left: in chair;with call bell/phone within reach;with chair alarm set Nurse Communication: Mobility status;Other (comment);Weight bearing status(to monitor KI contact on malleoli) PT Visit Diagnosis: Pain;Unsteadiness on feet (R26.81);Other abnormalities of gait and mobility (R26.89);Muscle weakness (generalized) (M62.81);History of falling (Z91.81);Difficulty in walking, not elsewhere classified (R26.2) Pain - Right/Left: Left Pain - part of body: Leg     Time: 2956-2130 PT Time Calculation (min) (ACUTE ONLY): 38 min  Charges:  $Gait Training: 23-37 mins $Therapeutic Activity: 8-22 mins                     Encarnacion Chu PT, DPT 01/31/2018, 1:42 PM

## 2018-02-21 ENCOUNTER — Ambulatory Visit: Payer: Self-pay | Admitting: Surgery

## 2018-03-02 ENCOUNTER — Encounter: Payer: Self-pay | Admitting: Emergency Medicine

## 2018-03-02 ENCOUNTER — Emergency Department: Payer: Medicare Other

## 2018-03-02 ENCOUNTER — Other Ambulatory Visit: Payer: Self-pay

## 2018-03-02 ENCOUNTER — Inpatient Hospital Stay
Admission: EM | Admit: 2018-03-02 | Discharge: 2018-03-05 | DRG: 603 | Disposition: A | Discharge status: Skilled Nursing Facility | Payer: Medicare Other | Attending: Internal Medicine | Admitting: Internal Medicine

## 2018-03-02 DIAGNOSIS — Z79899 Other long term (current) drug therapy: Secondary | ICD-10-CM | POA: Diagnosis not present

## 2018-03-02 DIAGNOSIS — I10 Essential (primary) hypertension: Secondary | ICD-10-CM | POA: Diagnosis present

## 2018-03-02 DIAGNOSIS — F17210 Nicotine dependence, cigarettes, uncomplicated: Secondary | ICD-10-CM | POA: Diagnosis not present

## 2018-03-02 DIAGNOSIS — L0390 Cellulitis, unspecified: Secondary | ICD-10-CM | POA: Diagnosis present

## 2018-03-02 DIAGNOSIS — E785 Hyperlipidemia, unspecified: Secondary | ICD-10-CM | POA: Diagnosis present

## 2018-03-02 DIAGNOSIS — L03116 Cellulitis of left lower limb: Secondary | ICD-10-CM | POA: Diagnosis present

## 2018-03-02 DIAGNOSIS — M87852 Other osteonecrosis, left femur: Secondary | ICD-10-CM | POA: Diagnosis not present

## 2018-03-02 DIAGNOSIS — M1712 Unilateral primary osteoarthritis, left knee: Secondary | ICD-10-CM | POA: Diagnosis not present

## 2018-03-02 DIAGNOSIS — G629 Polyneuropathy, unspecified: Secondary | ICD-10-CM | POA: Diagnosis not present

## 2018-03-02 DIAGNOSIS — F329 Major depressive disorder, single episode, unspecified: Secondary | ICD-10-CM | POA: Diagnosis not present

## 2018-03-02 LAB — CBC WITH DIFFERENTIAL/PLATELET
Abs Immature Granulocytes: 0.06 10*3/uL (ref 0.00–0.07)
BASOS PCT: 1 %
Basophils Absolute: 0.1 10*3/uL (ref 0.0–0.1)
EOS PCT: 1 %
Eosinophils Absolute: 0.1 10*3/uL (ref 0.0–0.5)
HEMATOCRIT: 40.1 % (ref 39.0–52.0)
Hemoglobin: 12.7 g/dL — ABNORMAL LOW (ref 13.0–17.0)
Immature Granulocytes: 1 %
Lymphocytes Relative: 22 %
Lymphs Abs: 2.7 10*3/uL (ref 0.7–4.0)
MCH: 30.8 pg (ref 26.0–34.0)
MCHC: 31.7 g/dL (ref 30.0–36.0)
MCV: 97.1 fL (ref 80.0–100.0)
Monocytes Absolute: 1.4 10*3/uL — ABNORMAL HIGH (ref 0.1–1.0)
Monocytes Relative: 11 %
NEUTROS ABS: 8 10*3/uL — AB (ref 1.7–7.7)
NRBC: 0 % (ref 0.0–0.2)
Neutrophils Relative %: 64 %
Platelets: 451 10*3/uL — ABNORMAL HIGH (ref 150–400)
RBC: 4.13 MIL/uL — ABNORMAL LOW (ref 4.22–5.81)
RDW: 14 % (ref 11.5–15.5)
WBC: 12.4 10*3/uL — ABNORMAL HIGH (ref 4.0–10.5)

## 2018-03-02 LAB — COMPREHENSIVE METABOLIC PANEL
ALT: 15 U/L (ref 0–44)
AST: 15 U/L (ref 15–41)
Albumin: 3.6 g/dL (ref 3.5–5.0)
Alkaline Phosphatase: 95 U/L (ref 38–126)
Anion gap: 6 (ref 5–15)
BILIRUBIN TOTAL: 0.4 mg/dL (ref 0.3–1.2)
BUN: 11 mg/dL (ref 8–23)
CO2: 29 mmol/L (ref 22–32)
CREATININE: 0.65 mg/dL (ref 0.61–1.24)
Calcium: 9.1 mg/dL (ref 8.9–10.3)
Chloride: 103 mmol/L (ref 98–111)
Glucose, Bld: 97 mg/dL (ref 70–99)
POTASSIUM: 4.3 mmol/L (ref 3.5–5.1)
Sodium: 138 mmol/L (ref 135–145)
TOTAL PROTEIN: 6.6 g/dL (ref 6.5–8.1)

## 2018-03-02 LAB — VITAMIN B12: Vitamin B-12: 190 pg/mL (ref 180–914)

## 2018-03-02 LAB — SEDIMENTATION RATE: Sed Rate: 17 mm/hr (ref 0–20)

## 2018-03-02 LAB — LACTIC ACID, PLASMA
LACTIC ACID, VENOUS: 1.1 mmol/L (ref 0.5–1.9)
Lactic Acid, Venous: 1.1 mmol/L (ref 0.5–1.9)

## 2018-03-02 MED ORDER — NICOTINE 21 MG/24HR TD PT24
21.0000 mg | MEDICATED_PATCH | Freq: Every day | TRANSDERMAL | Status: DC
  Administered 2018-03-02 – 2018-03-05 (×4): 21 mg via TRANSDERMAL
  Filled 2018-03-02 (×4): qty 1

## 2018-03-02 MED ORDER — SODIUM CHLORIDE 0.9 % IV SOLN
1.0000 g | Freq: Once | INTRAVENOUS | Status: AC
  Administered 2018-03-02: 1 g via INTRAVENOUS
  Filled 2018-03-02: qty 1

## 2018-03-02 MED ORDER — SODIUM CHLORIDE 0.9 % IV SOLN: 250.0000 mL | INTRAVENOUS | Status: DC | PRN

## 2018-03-02 MED ORDER — SODIUM CHLORIDE 0.9 % IV SOLN
1.0000 g | Freq: Once | INTRAVENOUS | Status: AC
  Administered 2018-03-02: 1 g via INTRAVENOUS

## 2018-03-02 MED ORDER — ENOXAPARIN SODIUM 40 MG/0.4ML ~~LOC~~ SOLN
40.0000 mg | SUBCUTANEOUS | Status: DC
  Administered 2018-03-02 – 2018-03-04 (×3): 40 mg via SUBCUTANEOUS
  Filled 2018-03-02 (×3): qty 0.4

## 2018-03-02 MED ORDER — BUPROPION HCL ER (SR) 150 MG PO TB12
150.0000 mg | ORAL_TABLET | Freq: Two times a day (BID) | ORAL | Status: DC
  Administered 2018-03-03: 150 mg via ORAL
  Filled 2018-03-02 (×7): qty 1

## 2018-03-02 MED ORDER — SODIUM CHLORIDE 0.9% FLUSH
3.0000 mL | Freq: Two times a day (BID) | INTRAVENOUS | Status: DC
  Administered 2018-03-02 – 2018-03-05 (×6): 3 mL via INTRAVENOUS

## 2018-03-02 MED ORDER — ZOLPIDEM TARTRATE 5 MG PO TABS: 5.0000 mg | ORAL_TABLET | Freq: Every evening | ORAL | Status: DC | PRN

## 2018-03-02 MED ORDER — DULOXETINE HCL 60 MG PO CPEP
60.0000 mg | ORAL_CAPSULE | Freq: Every day | ORAL | Status: DC
  Administered 2018-03-02 – 2018-03-05 (×4): 60 mg via ORAL
  Filled 2018-03-02 (×4): qty 1

## 2018-03-02 MED ORDER — METHOCARBAMOL 500 MG PO TABS
500.0000 mg | ORAL_TABLET | Freq: Four times a day (QID) | ORAL | Status: DC | PRN
  Administered 2018-03-03 – 2018-03-05 (×7): 500 mg via ORAL
  Filled 2018-03-02 (×7): qty 1

## 2018-03-02 MED ORDER — ACETAMINOPHEN 325 MG PO TABS
650.0000 mg | ORAL_TABLET | Freq: Four times a day (QID) | ORAL | Status: DC | PRN
  Administered 2018-03-03: 650 mg via ORAL
  Filled 2018-03-02: qty 2

## 2018-03-02 MED ORDER — PRAVASTATIN SODIUM 20 MG PO TABS
20.0000 mg | ORAL_TABLET | Freq: Every day | ORAL | Status: DC
  Administered 2018-03-03 – 2018-03-04 (×2): 20 mg via ORAL
  Filled 2018-03-02 (×2): qty 1

## 2018-03-02 MED ORDER — SODIUM CHLORIDE 0.9 % IV SOLN
2.0000 g | Freq: Two times a day (BID) | INTRAVENOUS | Status: DC
  Administered 2018-03-03 – 2018-03-04 (×4): 2 g via INTRAVENOUS
  Filled 2018-03-02 (×6): qty 2

## 2018-03-02 MED ORDER — SODIUM CHLORIDE 0.9% FLUSH: 3.0000 mL | INTRAVENOUS | Status: DC | PRN

## 2018-03-02 MED ORDER — VANCOMYCIN HCL IN DEXTROSE 1-5 GM/200ML-% IV SOLN
1000.0000 mg | Freq: Once | INTRAVENOUS | Status: AC
  Administered 2018-03-02: 1000 mg via INTRAVENOUS
  Filled 2018-03-02: qty 200

## 2018-03-02 MED ORDER — LISINOPRIL 20 MG PO TABS
20.0000 mg | ORAL_TABLET | Freq: Every day | ORAL | Status: DC
  Administered 2018-03-02 – 2018-03-05 (×4): 20 mg via ORAL
  Filled 2018-03-02 (×4): qty 1

## 2018-03-02 MED ORDER — METHOCARBAMOL 500 MG PO TABS
500.0000 mg | ORAL_TABLET | Freq: Once | ORAL | Status: AC
  Administered 2018-03-02: 500 mg via ORAL
  Filled 2018-03-02: qty 1

## 2018-03-02 MED ORDER — LACTULOSE 10 GM/15ML PO SOLN: 20.0000 g | Freq: Two times a day (BID) | ORAL | Status: DC | PRN

## 2018-03-02 MED ORDER — ONDANSETRON HCL 4 MG PO TABS: 4.0000 mg | ORAL_TABLET | Freq: Four times a day (QID) | ORAL | Status: DC | PRN

## 2018-03-02 MED ORDER — DICLOFENAC SODIUM 75 MG PO TBEC
75.0000 mg | DELAYED_RELEASE_TABLET | Freq: Two times a day (BID) | ORAL | Status: DC
  Administered 2018-03-02 – 2018-03-05 (×6): 75 mg via ORAL
  Filled 2018-03-02 (×7): qty 1

## 2018-03-02 MED ORDER — PIPERACILLIN-TAZOBACTAM 3.375 G IVPB 30 MIN: 3.3750 g | Freq: Once | INTRAVENOUS | Status: DC

## 2018-03-02 MED ORDER — VANCOMYCIN HCL IN DEXTROSE 1-5 GM/200ML-% IV SOLN: 1000.0000 mg | Freq: Once | INTRAVENOUS | Status: DC

## 2018-03-02 MED ORDER — PIPERACILLIN-TAZOBACTAM 3.375 G IVPB
INTRAVENOUS | Status: AC
  Filled 2018-03-02: qty 50

## 2018-03-02 MED ORDER — POLYETHYLENE GLYCOL 3350 17 G PO PACK
17.0000 g | PACK | Freq: Every day | ORAL | Status: DC
  Administered 2018-03-03 – 2018-03-05 (×2): 17 g via ORAL
  Filled 2018-03-02 (×2): qty 1

## 2018-03-02 MED ORDER — VANCOMYCIN HCL 10 G IV SOLR
1250.0000 mg | Freq: Two times a day (BID) | INTRAVENOUS | Status: DC
  Administered 2018-03-03 – 2018-03-04 (×3): 1250 mg via INTRAVENOUS
  Filled 2018-03-02 (×5): qty 1250

## 2018-03-02 MED ORDER — ONDANSETRON HCL 4 MG/2ML IJ SOLN: 4.0000 mg | Freq: Four times a day (QID) | INTRAMUSCULAR | Status: DC | PRN

## 2018-03-02 MED ORDER — ACETAMINOPHEN 650 MG RE SUPP: 650.0000 mg | Freq: Four times a day (QID) | RECTAL | Status: DC | PRN

## 2018-03-02 MED ORDER — OXYCODONE HCL 5 MG PO TABS
5.0000 mg | ORAL_TABLET | Freq: Four times a day (QID) | ORAL | Status: DC | PRN
  Administered 2018-03-02 – 2018-03-04 (×5): 5 mg via ORAL
  Filled 2018-03-02 (×5): qty 1

## 2018-03-02 NOTE — Progress Notes (Signed)
Advanced care plan.  Purpose of the Encounter: CODE STATUS  Parties in Attendance:Patient  Patient's Decision Capacity:Good  Subjective/Patient's story: Presented to ER with pain and redness in left leg   Objective/Medical story Has cellulitis left leg Needs IV antibiotics   Goals of care determination:  Advance care directives and goals of care discussed  Patient wants everything done to include CPR, intubation and ventilator of the need arises   CODE STATUS: Full code   Time spent discussing advanced care planning: 16 minutes

## 2018-03-02 NOTE — ED Notes (Signed)
FIRST NURSE NOTE: Pt to ED from home via EMS reporting a possible infection to her left calf. Pt has a hx of tibfib fracture with repair. Incision site is reported to look infected as well. RN unable to assess in lobby at this time.

## 2018-03-02 NOTE — H&P (Addendum)
[facility] [place] [facility]   PATIENT NAME: Alejandro Cook    MR#:  [id number]  DATE OF BIRTH:  05/11/1950  DATE OF ADMISSION:  03/02/2018  PRIMARY CARE PHYSICIAN: Leanna SatoMiles, Linda M, MD   REQUESTING/REFERRING PHYSICIAN:   CHIEF COMPLAINT:   Chief Complaint  Patient presents with  . Post-op Problem    HISTORY OF PRESENT ILLNESS: Alejandro Cook  is a 67 y.o. male with a known history of hyperlipidemia, hypertension, recent history of open reduction internal fixation of tibial fracture left side presented to the emergency room with redness and pain in the left lower Cook.  Patient has some redness and swelling in the left shin area.  Was worked up with Doppler ultrasound of lower Cook which showed no DVT.  Patient received physical therapy for the last 1 month.  He also received Lovenox shots subcutaneously and has completed.  He was evaluated in the emergency room WBC count was high and patient was started on antibiotics.  PAST MEDICAL HISTORY:   Past Medical History:  Diagnosis Date  . Depression   . Hyperlipidemia   . Hypertension     PAST SURGICAL HISTORY:  Past Surgical History:  Procedure Laterality Date  . CLOSED REDUCTION TIBIA Left 01/16/2018   Procedure: CLOSED REDUCTION TIBIA VS. ORIF TIBIA;  Surgeon: Juanell FairlyKrasinski, Kevin, MD;  Location: [facility] ORS;  Service: Orthopedics;  Laterality: Left;  . ORIF TIBIA FRACTURE Left 01/16/2018   Procedure: OPEN REDUCTION INTERNAL FIXATION (ORIF) TIBIA FRACTURE;  Surgeon: Juanell FairlyKrasinski, Kevin, MD;  Location: [facility] ORS;  Service: Orthopedics;  Laterality: Left;    SOCIAL HISTORY:  Social History   Tobacco Use  . Smoking status: Current Every Day Smoker    Packs/day: 0.50    Types: Cigarettes  . Smokeless tobacco: Never Used  Substance Use Topics  . Alcohol use: Never    Frequency: Never    FAMILY HISTORY:  Family History  Problem Relation Age of Onset  . Breast cancer Mother   . CVA Father      DRUG ALLERGIES: No Known Allergies  REVIEW OF SYSTEMS:   CONSTITUTIONAL: No fever, fatigue or weakness.  EYES: No blurred or double vision.  EARS, NOSE, AND THROAT: No tinnitus or ear pain.  RESPIRATORY: No cough, shortness of breath, wheezing or hemoptysis.  CARDIOVASCULAR: No chest pain, orthopnea, edema.  GASTROINTESTINAL: No nausea, vomiting, diarrhea or abdominal pain.  GENITOURINARY: No dysuria, hematuria.  ENDOCRINE: No polyuria, nocturia,  HEMATOLOGY: No anemia, easy bruising or bleeding SKIN: redness of skin left leg MUSCULOSKELETAL: Tenderness left leg NEUROLOGIC: No tingling, numbness, weakness.  PSYCHIATRY: No anxiety or depression.   MEDICATIONS AT HOME:  Prior to Admission medications   Medication Sig Start Date End Date Taking? Authorizing Provider  acetaminophen (TYLENOL) 325 MG tablet Take 2 tablets (650 mg total) by mouth every 6 (six) hours as needed for moderate pain (headache). 01/31/18  Yes Houston SirenSainani, Vivek J, MD  diclofenac (VOLTAREN) 75 MG EC tablet Take 1 tablet by mouth 2 (two) times daily. 11/20/17  Yes [provider]  DULoxetine (CYMBALTA) 60 MG capsule Take 1 capsule by mouth daily. 12/19/17  Yes [provider]  lisinopril-hydrochlorothiazide (PRINZIDE,ZESTORETIC) 20-12.5 MG tablet Take 1 tablet by mouth daily. 02/21/18  Yes [provider]  lovastatin (MEVACOR) 40 MG tablet Take 1 tablet (40 mg total) by mouth daily. 01/31/18  Yes Sainani, Rolly PancakeVivek J, MD  methocarbamol (ROBAXIN) 500 MG tablet Take 1 tablet (500 mg total) by mouth every 6 (  six) hours as needed for muscle spasms. 01/18/18  Yes Sainani, Rolly Pancake, MD  enoxaparin (LOVENOX) 40 MG/0.4ML injection Inject 0.4 mLs (40 mg total) into the skin daily for 14 days. 01/19/18 02/02/18  Houston Siren, MD  lactulose (CHRONULAC) 10 GM/15ML solution Take 30 mLs (20 g total) by mouth 2 (two) times daily as needed for mild constipation. Patient not taking: Reported on 03/02/2018  01/31/18   Houston Siren, MD  lisinopril (PRINIVIL,ZESTRIL) 20 MG tablet Take 1 tablet (20 mg total) by mouth daily. Patient not taking: Reported on 03/02/2018 02/01/18   Houston Siren, MD  oxyCODONE (OXY IR/ROXICODONE) 5 MG immediate release tablet Take 1 tablet (5 mg total) by mouth every 6 (six) hours as needed for moderate pain. Patient not taking: Reported on 03/02/2018 01/31/18   Houston Siren, MD  polyethylene glycol [facility] E[facility]lene Hal) packet Take 17 g by mouth daily. Patient not taking: Reported on 03/02/2018 02/01/18   Houston Siren, MD  zolpidem (AMBIEN) 5 MG tablet Take 1 tablet (5 mg total) by mouth at bedtime as needed for sleep. Patient not taking: Reported on 03/02/2018 01/31/18   Houston Siren, MD      PHYSICAL EXAMINATION:   VITAL SIGNS: Blood pressure 124/80, pulse 79, temperature 98.2 F (36.8 C), temperature source Oral, resp. rate 17, height 5\' 11"  (1.803 m), weight 83.9 kg, SpO2 99 %.  GENERAL:  67 y.o.-year-old patient lying in the bed with no acute distress.  EYES: Pupils equal, round, reactive to light and accommodation. No scleral icterus. Extraocular muscles intact.  HEENT: Head atraumatic, normocephalic. Oropharynx and nasopharynx clear.  NECK:  Supple, no jugular venous distention. No thyroid enlargement, no tenderness.  LUNGS: Normal breath sounds bilaterally, no wheezing, rales,rhonchi or crepitation. No use of accessory muscles of respiration.  CARDIOVASCULAR: S1, S2 normal. No murmurs, rubs, or gallops.  ABDOMEN: Soft, nontender, nondistended. Bowel sounds present. No organomegaly or mass.  EXTREMITIES: Tenderness left leg NEUROLOGIC: Cranial nerves II through XII are intact. Muscle strength 5/5 in all extremities. Sensation intact. Gait not checked.  PSYCHIATRIC: The patient is alert and oriented x 3.  SKIN: Redness of skin over the shin left leg  LABORATORY PANEL:   CBC Recent Labs  Lab 03/02/18 1806  WBC 12.4*  HGB 12.7*  HCT  40.1  PLT 451*  MCV 97.1  MCH 30.8  MCHC 31.7  RDW 14.0  LYMPHSABS 2.7  MONOABS 1.4*  EOSABS 0.1  BASOSABS 0.1   ------------------------------------------------------------------------------------------------------------------  Chemistries  Recent Labs  Lab 03/02/18 1806  NA 138  K 4.3  CL 103  CO2 29  GLUCOSE 97  BUN 11  CREATININE 0.65  CALCIUM 9.1  AST 15  ALT 15  ALKPHOS 95  BILITOT 0.4   ------------------------------------------------------------------------------------------------------------------ estimated creatinine clearance is 92.8 mL/min (by C-G formula based on SCr of 0.65 mg/dL). ------------------------------------------------------------------------------------------------------------------ No results for input(s): TSH, T4TOTAL, T3FREE, THYROIDAB in the last 72 hours.  Invalid input(s): FREET3   Coagulation profile No results for input(s): INR, PROTIME in the last 168 hours. ------------------------------------------------------------------------------------------------------------------- No results for input(s): DDIMER in the last 72 hours. -------------------------------------------------------------------------------------------------------------------  Cardiac Enzymes No results for input(s): CKMB, TROPONINI, MYOGLOBIN in the last 168 hours.  Invalid input(s): CK ------------------------------------------------------------------------------------------------------------------ Invalid input(s): POCBNP  ---------------------------------------------------------------------------------------------------------------  Urinalysis    Component Value Date/Time   COLORURINE AMBER (A) 01/15/2018 0304   APPEARANCEUR CLEAR (A) 01/15/2018 0304   APPEARANCEUR Turbid 04/17/2014 1601   LABSPEC 1.028 01/15/2018 0304   LABSPEC 1.029  04/17/2014 1601   PHURINE 5.0 01/15/2018 0304   GLUCOSEU NEGATIVE 01/15/2018 0304   GLUCOSEU Negative 04/17/2014 1601    HGBUR NEGATIVE 01/15/2018 0304   BILIRUBINUR NEGATIVE 01/15/2018 0304   BILIRUBINUR Negative 04/17/2014 1601   KETONESUR 5 (A) 01/15/2018 0304   PROTEINUR NEGATIVE 01/15/2018 0304   NITRITE NEGATIVE 01/15/2018 0304   LEUKOCYTESUR NEGATIVE 01/15/2018 0304   LEUKOCYTESUR Negative 04/17/2014 1601     RADIOLOGY: Dg Tibia/fibula Left  Result Date: 03/02/2018 CLINICAL DATA:  Redness and swelling, status post orthopedic surgery. EXAM: LEFT TIBIA AND FIBULA - 2 VIEW COMPARISON:  LEFT knee radiograph January 16, 2018 FINDINGS: Lateral plate and screw fixation of proximal tibial fracture, softening of the fracture line. Intact hardware without periprosthetic lucency. Subacute to chronic nondisplaced proximal fibular fracture. Osteopenia. No destructive bony lesions. Advanced vascular calcifications, soft tissues are nonacute. IMPRESSION: 1. S/p proximal tibia ORIF. Disuse osteopenia. Fracture line remains visible. Electronically Signed   By: Awilda Metro M.D.   On: 03/02/2018 19:25   US Venous Img Lower Unilateral Left  Result Date: 03/02/2018 CLINICAL DATA:  LEFT lower Cook pain and swelling, status post lower Cook ORIF January 16, 2018. EXAM: LEFT LOWER Cook VENOUS DOPPLER ULTRASOUND TECHNIQUE: Gray-scale sonography with graded compression, as well as color Doppler and duplex ultrasound were performed to evaluate the lower Cook deep venous systems from the level of the common femoral vein and including the common femoral, femoral, profunda femoral, popliteal and calf veins including the posterior tibial, peroneal and gastrocnemius veins when visible. The superficial great saphenous vein was also interrogated. Spectral Doppler was utilized to evaluate flow at rest and with distal augmentation maneuvers in the common femoral, femoral and popliteal veins. COMPARISON:  LEFT lower Cook venous ultrasound January 23, 2018 FINDINGS: Contralateral Common Femoral Vein: Respiratory  phasicity is normal and symmetric with the symptomatic side. No evidence of thrombus. Normal compressibility. Common Femoral Vein: No evidence of thrombus. Normal compressibility, respiratory phasicity and response to augmentation. Profunda Femoral Vein: No evidence of thrombus. Normal compressibility and flow on color Doppler imaging. Femoral Vein: No evidence of thrombus. Normal compressibility, respiratory phasicity and response to augmentation. Popliteal Vein: No evidence of thrombus. Normal compressibility, respiratory phasicity and response to augmentation. Calf Veins: No evidence of thrombus. Normal compressibility and flow on color Doppler imaging. Superficial Great Saphenous Vein: No evidence of thrombus. Normal compressibility. Other Findings:  None. IMPRESSION: No LEFT lower Cook deep vein thrombosis. Electronically Signed   By: Awilda Metro M.D.   On: 03/02/2018 19:27    EKG: Orders placed or performed during the hospital encounter of 01/14/18  . ED EKG  . ED EKG    IMPRESSION AND PLAN:  67 year old male patient with history of open reduction internal fixation of left tibial fracture, hypertension, hyperlipidemia presented to the emergency room for pain, redness in the left leg  -Left leg cellulitis Doppler ultrasound showed no DVT Patient on IV vancomycin and Zosyn antibiotic Narrow antibiotics after culture results Follow-up WBC count  -Recent left tibial open reduction internal fixation surgery Orthopedic follow-up  -DVT prophylaxis subcu Lovenox daily  -Leukocytosis secondary cellulitis of the leg Follow-up WBC count  -Hyperlipidemia Resume home dose statin medication  -Tobacco abuse Tobacco cessation counseled the patient for 6 minutes Nicotine patch offered  All the records are reviewed and case discussed with ED provider. Management plans discussed with the patient, family and they are in agreement.  CODE STATUS:Full code Code Status History    Date  Active Date  Inactive Code Status Order ID Comments User Context   01/23/2018 2153 01/31/2018 1837 Full Code [id number]  Altamese Dilling, MD Inpatient   01/14/2018 2050 01/18/2018 1917 Full Code [id number]  Ihor Austin, MD Inpatient    Advance Directive Documentation     Most Recent Value  Type of Advance Directive  Healthcare Power of Attorney, Living will  Pre-existing out of facility DNR order (yellow form or pink MOST form)  -  "MOST" Form in Place?  -       TOTAL TIME TAKING CARE OF THIS PATIENT: 52 minutes.    Ihor Austin M.D on 03/02/2018 at 8:26 PM  Between 7am to 6pm - Pager - [phone]  After 6pm go to [email or web address] - password [email or web address] [facility]  [place]  [facility]  Office  [phone]  CC: Primary care physician; Leanna Sato, MD

## 2018-03-02 NOTE — ED Notes (Signed)
Patient transported to X-ray 

## 2018-03-02 NOTE — ED Notes (Signed)
3.375g Zosyn started at 2041; unable to scan or document d/t being locked out of pt's MAR while Pharmacy verifies pt's medication(s).

## 2018-03-02 NOTE — ED Notes (Addendum)
Order for Zosyn 3.375g IV infusion d/c'd.  Infusion stopped at this time; pt received almost the complete dose of 3.375g (47mL of the 50mL total was infused).

## 2018-03-02 NOTE — ED Provider Notes (Addendum)
[facility] Emergency Department Provider Note   ____________________________________________   First MD Initiated Contact with Patient 03/02/18 1828     (approximate)  I have reviewed the triage vital signs and the nursing notes.   HISTORY  Chief Complaint Post-op Problem  HPI Alejandro Cook is a 67 y.o. male who had ORIF of his leg early last month.  He came out of the immobilizer on Tuesday today was sent here because his leg was red warm and swollen and apparently use he and they were worried about infection.  Patient reports the leg feels like it has a sunburn on and it is painful to touch.  He is not running a fever.   Past Medical History:  Diagnosis Date  . Depression   . Hyperlipidemia   . Hypertension     Patient Active Problem List   Diagnosis Date Noted  . Ileus (HCC)   . Ogilvie's syndrome   . Large bowel obstruction (HCC) 01/23/2018  . Hernia of abdominal wall 01/23/2018  . Tibia fracture 01/14/2018    Past Surgical History:  Procedure Laterality Date  . CLOSED REDUCTION TIBIA Left 01/16/2018   Procedure: CLOSED REDUCTION TIBIA VS. ORIF TIBIA;  Surgeon: Juanell Fairly, MD;  Location: [facility] ORS;  Service: Orthopedics;  Laterality: Left;  . ORIF TIBIA FRACTURE Left 01/16/2018   Procedure: OPEN REDUCTION INTERNAL FIXATION (ORIF) TIBIA FRACTURE;  Surgeon: Juanell Fairly, MD;  Location: [facility] ORS;  Service: Orthopedics;  Laterality: Left;    Prior to Admission medications   Medication Sig Start Date End Date Taking? Authorizing Provider  acetaminophen (TYLENOL) 325 MG tablet Take 2 tablets (650 mg total) by mouth every 6 (six) hours as needed for moderate pain (headache). 01/31/18   Houston Siren, MD  buPROPion (WELLBUTRIN SR) 150 MG 12 hr tablet Take 1 tablet by mouth 2 (two) times daily. 11/08/17   [provider]  diclofenac (VOLTAREN) 75 MG EC tablet Take 1 tablet by mouth 2 (two) times daily. 11/20/17   [provider]  DULoxetine (CYMBALTA) 60 MG capsule Take 1 capsule by mouth daily. 12/19/17   [provider]  enoxaparin (LOVENOX) 40 MG/0.4ML injection Inject 0.4 mLs (40 mg total) into the skin daily for 14 days. 01/19/18 02/02/18  Houston Siren, MD  lactulose (CHRONULAC) 10 GM/15ML solution Take 30 mLs (20 g total) by mouth 2 (two) times daily as needed for mild constipation. 01/31/18   Houston Siren, MD  lisinopril (PRINIVIL,ZESTRIL) 20 MG tablet Take 1 tablet (20 mg total) by mouth daily. 02/01/18   Houston Siren, MD  lovastatin (MEVACOR) 40 MG tablet Take 1 tablet (40 mg total) by mouth daily. 01/31/18   Houston Siren, MD  methocarbamol (ROBAXIN) 500 MG tablet Take 1 tablet (500 mg total) by mouth every 6 (six) hours as needed for muscle spasms. 01/18/18   Houston Siren, MD  oxyCODONE (OXY IR/ROXICODONE) 5 MG immediate release tablet Take 1 tablet (5 mg total) by mouth every 6 (six) hours as needed for moderate pain. 01/31/18   Houston Siren, MD  polyethylene glycol (MIRALAX / GLYCOLAX) packet Take 17 g by mouth daily. 02/01/18   Houston Siren, MD  zolpidem (AMBIEN) 5 MG tablet Take 1 tablet (5 mg total) by mouth at bedtime as needed for sleep. 01/31/18   Houston Siren, MD    Allergies Patient has no known allergies.  Family History  Problem Relation Age of Onset  .  Breast cancer Mother   . CVA Father     Social History Social History   Tobacco Use  . Smoking status: Current Every Day Smoker    Packs/day: 0.50    Types: Cigarettes  . Smokeless tobacco: Never Used  Substance Use Topics  . Alcohol use: Never    Frequency: Never  . Drug use: Yes    Types: Marijuana    Review of Systems  Constitutional: No fever/chills Eyes: No visual changes. ENT: No sore throat. Cardiovascular: Denies chest pain. Respiratory: Denies shortness of breath. Gastrointestinal: No abdominal pain.  No nausea, no vomiting.  No diarrhea.  No  constipation. Genitourinary: Negative for dysuria. Musculoskeletal: Negative for back pain. Skin: Negative for rash. Neurological: Negative for headaches, focal weakness   ____________________________________________   PHYSICAL EXAM:  VITAL SIGNS: ED Triage Vitals  Enc Vitals Group     BP 03/02/18 1743 133/76     Pulse Rate 03/02/18 1743 77     Resp 03/02/18 1743 16     Temp 03/02/18 1743 98.2 F (36.8 C)     Temp Source 03/02/18 1743 Oral     SpO2 03/02/18 1743 98 %     Weight 03/02/18 1752 185 lb (83.9 kg)     Height 03/02/18 1752 5\' 11"  (1.803 m)     Head Circumference --      Peak Flow --      Pain Score 03/02/18 1751 3     Pain Loc --      Pain Edu? --      Excl. in GC? --     Constitutional: Alert and oriented. Well appearing and in no acute distress. Eyes: Conjunctivae are normal.  Head: Atraumatic. Nose: No congestion/rhinnorhea. Mouth/Throat: Mucous membranes are moist.  Oropharynx non-erythematous. Neck: No stridor. Cardiovascular: Normal rate, regular rhythm. Grossly normal heart sounds.  Good peripheral circulation. Respiratory: Normal respiratory effort.  No retractions. Lungs CTAB. Gastrointestinal: Soft and nontender. No distention. No abdominal bruits. No CVA tenderness. Musculoskeletal: Bilateral 2+ edema to the knees left leg is warm red and tender Neurologic:  Normal speech and language. No gross focal neurologic deficits are appreciated.  Skin:  Skin is warm, dry and intact. No rash noted. Psychiatric: Mood and affect are normal. Speech and behavior are normal.  ____________________________________________   LABS (all labs ordered are listed, but only abnormal results are displayed)  Labs Reviewed  CBC WITH DIFFERENTIAL/PLATELET - Abnormal; Notable for the following components:      Result Value   WBC 12.4 (*)    RBC 4.13 (*)    Hemoglobin 12.7 (*)    Platelets 451 (*)    Neutro Abs 8.0 (*)    Monocytes Absolute 1.4 (*)    All other  components within normal limits  LACTIC ACID, PLASMA  COMPREHENSIVE METABOLIC PANEL  LACTIC ACID, PLASMA  SEDIMENTATION RATE   ____________________________________________  EKG   ____________________________________________  RADIOLOGY  ED MD interpretation: X-ray of the leg does not show any apparent osteomyelitis there is no DVT on the ultrasound per radiology.  I reviewed the x-rays.  Official radiology report(s): Dg Tibia/fibula Left  Result Date: 03/02/2018 CLINICAL DATA:  Redness and swelling, status post orthopedic surgery. EXAM: LEFT TIBIA AND FIBULA - 2 VIEW COMPARISON:  LEFT knee radiograph January 16, 2018 FINDINGS: Lateral plate and screw fixation of proximal tibial fracture, softening of the fracture line. Intact hardware without periprosthetic lucency. Subacute to chronic nondisplaced proximal fibular fracture. Osteopenia. No destructive bony lesions. Advanced vascular  calcifications, soft tissues are nonacute. IMPRESSION: 1. S/p proximal tibia ORIF. Disuse osteopenia. Fracture line remains visible. Electronically Signed   By: Awilda Metro M.D.   On: 03/02/2018 19:25   US Venous Img Lower Unilateral Left  Result Date: 03/02/2018 CLINICAL DATA:  LEFT lower extremity pain and swelling, status post lower extremity ORIF January 16, 2018. EXAM: LEFT LOWER EXTREMITY VENOUS DOPPLER ULTRASOUND TECHNIQUE: Gray-scale sonography with graded compression, as well as color Doppler and duplex ultrasound were performed to evaluate the lower extremity deep venous systems from the level of the common femoral vein and including the common femoral, femoral, profunda femoral, popliteal and calf veins including the posterior tibial, peroneal and gastrocnemius veins when visible. The superficial great saphenous vein was also interrogated. Spectral Doppler was utilized to evaluate flow at rest and with distal augmentation maneuvers in the common femoral, femoral and popliteal veins. COMPARISON:   LEFT lower extremity venous ultrasound January 23, 2018 FINDINGS: Contralateral Common Femoral Vein: Respiratory phasicity is normal and symmetric with the symptomatic side. No evidence of thrombus. Normal compressibility. Common Femoral Vein: No evidence of thrombus. Normal compressibility, respiratory phasicity and response to augmentation. Profunda Femoral Vein: No evidence of thrombus. Normal compressibility and flow on color Doppler imaging. Femoral Vein: No evidence of thrombus. Normal compressibility, respiratory phasicity and response to augmentation. Popliteal Vein: No evidence of thrombus. Normal compressibility, respiratory phasicity and response to augmentation. Calf Veins: No evidence of thrombus. Normal compressibility and flow on color Doppler imaging. Superficial Great Saphenous Vein: No evidence of thrombus. Normal compressibility. Other Findings:  None. IMPRESSION: No LEFT lower extremity deep vein thrombosis. Electronically Signed   By: Awilda Metro M.D.   On: 03/02/2018 19:27    ____________________________________________   PROCEDURES  Procedure(s) performed:   Procedures  Critical Care performed: Medical care time 20 minutes this includes reviewing the patient's old records his studies and speaking with the hospital physician.  ____________________________________________   INITIAL IMPRESSION / ASSESSMENT AND PLAN / ED COURSE  Patient with apparent cellulitis of the left leg.  Get him started on some antibiotics get him in the hospital and await the sed rate consider other evaluation and make sure he does not have an osteomyelitis as well.      ____________________________________________   FINAL CLINICAL IMPRESSION(S) / ED DIAGNOSES  Final diagnoses:  Cellulitis of left leg     ED Discharge Orders    None       Note:  This document was prepared using Dragon voice recognition software and may include unintentional dictation errors.    Arnaldo Natal, MD 03/02/18 1943    Arnaldo Natal, MD 03/02/18 1950    Arnaldo Natal, MD 03/13/18 [phone]

## 2018-03-02 NOTE — Progress Notes (Signed)
Pharmacy Antibiotic Note  Alejandro ClintonJohn S Cook is a 67 y.o. male admitted on 03/02/2018 with cellulitis.  Pharmacy has been consulted for Vancomycin, Cefepime dosing.  Plan: Vancomycin 1 gm IV X 1 given in ED on 11/22 @ 20:37. Vancomycin 1250 mg IV Q12H ordered to start on 11/22 @ 0200, ~ 6 hrs after 1st dose (stacked dosing).  This pt will reach Css by 11/24 @ 21:00. Will draw 1st trough on 11/24 @ 13:30 , which will be very close to Css.   Cefepime 1 gm IV X 1 given on 11/22 @ 20:00.  Will order additional Cefepime 1 gm IV X 1 to make total starting dose of 2 gm. Will order Cefepime 2 gm IV Q12H ordered to continue on 11/23 @ 0800.   Height: 5\' 11"  (180.3 cm) Weight: 185 lb (83.9 kg) IBW/kg (Calculated) : 75.3  Temp (24hrs), Avg:98.2 F (36.8 C), Min:98.2 F (36.8 C), Max:98.2 F (36.8 C)  Recent Labs  Lab 03/02/18 1806 03/02/18 1955  WBC 12.4*  --   CREATININE 0.65  --   LATICACIDVEN 1.1 1.1    Estimated Creatinine Clearance: 92.8 mL/min (by C-G formula based on SCr of 0.65 mg/dL).    No Known Allergies  Antimicrobials this admission:   >>    >>   Dose adjustments this admission:   Microbiology results:  BCx:   UCx:    Sputum:    MRSA PCR:   Thank you for allowing pharmacy to be a part of this patient's care.  Toren Tucholski D 03/02/2018 8:48 PM

## 2018-03-02 NOTE — ED Triage Notes (Signed)
Pt in via [facility] from home with possible infection to left lower leg.  Pt with orthopedic surgery 01/16/18, recently out of immobilizer.  Swelling, redness, warmth, weeping noted to left lower leg.  Pt denies any new pain.  Vitals WDL.

## 2018-03-03 DIAGNOSIS — L03116 Cellulitis of left lower limb: Secondary | ICD-10-CM | POA: Diagnosis not present

## 2018-03-03 LAB — CBC
HEMATOCRIT: 34.7 % — AB (ref 39.0–52.0)
Hemoglobin: 11 g/dL — ABNORMAL LOW (ref 13.0–17.0)
MCH: 30.7 pg (ref 26.0–34.0)
MCHC: 31.7 g/dL (ref 30.0–36.0)
MCV: 96.9 fL (ref 80.0–100.0)
Platelets: 362 10*3/uL (ref 150–400)
RBC: 3.58 MIL/uL — AB (ref 4.22–5.81)
RDW: 14 % (ref 11.5–15.5)
WBC: 19.7 10*3/uL — AB (ref 4.0–10.5)
nRBC: 0 % (ref 0.0–0.2)

## 2018-03-03 LAB — BASIC METABOLIC PANEL
ANION GAP: 4 — AB (ref 5–15)
BUN: 12 mg/dL (ref 8–23)
CHLORIDE: 105 mmol/L (ref 98–111)
CO2: 28 mmol/L (ref 22–32)
Calcium: 8.3 mg/dL — ABNORMAL LOW (ref 8.9–10.3)
Creatinine, Ser: 0.77 mg/dL (ref 0.61–1.24)
GFR calc Af Amer: >60 mL/min (ref >60)
Glucose, Bld: 100 mg/dL — ABNORMAL HIGH (ref 70–99)
Potassium: 3.9 mmol/L (ref 3.5–5.1)
SODIUM: 137 mmol/L (ref 135–145)

## 2018-03-03 MED ORDER — LIDOCAINE 5 % EX PTCH
1.0000 | MEDICATED_PATCH | CUTANEOUS | Status: DC
  Administered 2018-03-03 – 2018-03-04 (×2): 1 via TRANSDERMAL
  Filled 2018-03-03 (×4): qty 1

## 2018-03-03 NOTE — Progress Notes (Signed)
[employer] -  at [facility]   PATIENT NAME: Alejandro Cook    MR#:  [id number]  DATE OF BIRTH:  1950-04-19  SUBJECTIVE:  CHIEF COMPLAINT:   Chief Complaint  Patient presents with  . Post-op Problem  Patient seen today Improved swelling and redness in the left leg Decreased pain in the left lower extremity Has some scrotal itching  REVIEW OF SYSTEMS:    ROS  CONSTITUTIONAL: No documented fever. No fatigue, weakness. No weight gain, no weight loss.  EYES: No blurry or double vision.  ENT: No tinnitus. No postnasal drip. No redness of the oropharynx.  RESPIRATORY: No cough, no wheeze, no hemoptysis. No dyspnea.  CARDIOVASCULAR: No chest pain. No orthopnea. No palpitations. No syncope.  GASTROINTESTINAL: No nausea, no vomiting or diarrhea. No abdominal pain. No melena or hematochezia.  GENITOURINARY: No dysuria or hematuria.  ENDOCRINE: No polyuria or nocturia. No heat or cold intolerance.  HEMATOLOGY: No anemia. No bruising. No bleeding.  INTEGUMENTARY: Decreased swelling and pain in the left leg MUSCULOSKELETAL: No arthritis. No swelling. No gout.  NEUROLOGIC: No numbness, tingling, or ataxia. No seizure-type activity.  PSYCHIATRIC: No anxiety. No insomnia. No ADD.   DRUG ALLERGIES:  No Known Allergies  VITALS:  Blood pressure (!) 141/83, pulse 73, temperature 98.1 F (36.7 C), temperature source Oral, resp. rate 17, height 5\' 11"  (1.803 m), weight 83.9 kg, SpO2 93 %.  PHYSICAL EXAMINATION:   Physical Exam  GENERAL:  67 y.o.-year-old patient lying in the bed with no acute distress.  EYES: Pupils equal, round, reactive to light and accommodation. No scleral icterus. Extraocular muscles intact.  HEENT: Head atraumatic, normocephalic. Oropharynx and nasopharynx clear.  NECK:  Supple, no jugular venous distention. No thyroid enlargement, no tenderness.  LUNGS: Normal breath sounds bilaterally, no wheezing, rales, rhonchi. No use of accessory muscles  of respiration.  CARDIOVASCULAR: S1, S2 normal. No murmurs, rubs, or gallops.  ABDOMEN: Soft, nontender, nondistended. Bowel sounds present. No organomegaly or mass.  Genitourinary scrotal erythema EXTREMITIES: No cyanosis, clubbing  Left lower extremity decreased swelling and redness  Decreased tenderness left leg NEUROLOGIC: Cranial nerves II through XII are intact. No focal Motor or sensory deficits b/l.   PSYCHIATRIC: The patient is alert and oriented x 3.  SKIN: No obvious rash, lesion, or ulcer.   LABORATORY PANEL:   CBC Recent Labs  Lab 03/03/18 0400  WBC 19.7*  HGB 11.0*  HCT 34.7*  PLT 362   ------------------------------------------------------------------------------------------------------------------ Chemistries  Recent Labs  Lab 03/02/18 1806 03/03/18 0400  NA 138 137  K 4.3 3.9  CL 103 105  CO2 29 28  GLUCOSE 97 100*  BUN 11 12  CREATININE 0.65 0.77  CALCIUM 9.1 8.3*  AST 15  --   ALT 15  --   ALKPHOS 95  --   BILITOT 0.4  --    ------------------------------------------------------------------------------------------------------------------  Cardiac Enzymes No results for input(s): TROPONINI in the last 168 hours. ------------------------------------------------------------------------------------------------------------------  RADIOLOGY:  Dg Tibia/fibula Left  Result Date: 03/02/2018 CLINICAL DATA:  Redness and swelling, status post orthopedic surgery. EXAM: LEFT TIBIA AND FIBULA - 2 VIEW COMPARISON:  LEFT knee radiograph January 16, 2018 FINDINGS: Lateral plate and screw fixation of proximal tibial fracture, softening of the fracture line. Intact hardware without periprosthetic lucency. Subacute to chronic nondisplaced proximal fibular fracture. Osteopenia. No destructive bony lesions. Advanced vascular calcifications, soft tissues are nonacute. IMPRESSION: 1. S/p proximal tibia ORIF. Disuse osteopenia. Fracture line remains visible. Electronically  Signed   By:  Awilda Metroourtnay  Bloomer M.D.   On: 03/02/2018 19:25   [place] Venous Img Lower Unilateral Left  Result Date: 03/02/2018 CLINICAL DATA:  LEFT lower extremity pain and swelling, status post lower extremity ORIF January 16, 2018. EXAM: LEFT LOWER EXTREMITY VENOUS DOPPLER ULTRASOUND TECHNIQUE: Gray-scale sonography with graded compression, as well as color Doppler and duplex ultrasound were performed to evaluate the lower extremity deep venous systems from the level of the common femoral vein and including the common femoral, femoral, profunda femoral, popliteal and calf veins including the posterior tibial, peroneal and gastrocnemius veins when visible. The superficial great saphenous vein was also interrogated. Spectral Doppler was utilized to evaluate flow at rest and with distal augmentation maneuvers in the common femoral, femoral and popliteal veins. COMPARISON:  LEFT lower extremity venous ultrasound January 23, 2018 FINDINGS: Contralateral Common Femoral Vein: Respiratory phasicity is normal and symmetric with the symptomatic side. No evidence of thrombus. Normal compressibility. Common Femoral Vein: No evidence of thrombus. Normal compressibility, respiratory phasicity and response to augmentation. Profunda Femoral Vein: No evidence of thrombus. Normal compressibility and flow on color Doppler imaging. Femoral Vein: No evidence of thrombus. Normal compressibility, respiratory phasicity and response to augmentation. Popliteal Vein: No evidence of thrombus. Normal compressibility, respiratory phasicity and response to augmentation. Calf Veins: No evidence of thrombus. Normal compressibility and flow on color Doppler imaging. Superficial Great Saphenous Vein: No evidence of thrombus. Normal compressibility. Other Findings:  None. IMPRESSION: No LEFT lower extremity deep vein thrombosis. Electronically Signed   By: Awilda Metroourtnay  Bloomer M.D.   On: 03/02/2018 19:27     ASSESSMENT AND PLAN:   67 year old male  patient with history of open reduction internal fixation of left tibial fracture, hypertension, hyperlipidemia presented to the emergency room for pain, redness in the left leg  -Left leg cellulitis improving slowly Continue IV vancomycin and Zosyn antibiotic Narrow antibiotics after culture results Follow-up WBC count  -Recent left tibial open reduction internal fixation surgery Orthopedic follow-up appreciated Physical therapy evaluation  -DVT prophylaxis subcu Lovenox daily  -Leukocytosis worsening secondary cellulitis of the leg Continue broad-spectrum antibiotics Follow-up WBC count  -Hyperlipidemia Resume home dose statin medication  -Tobacco abuse Tobacco cessation counseled the patient for 6 minutes Nicotine patch offered  All the records are reviewed and case discussed with Care Management/Social Worker. Management plans discussed with the patient, family and they are in agreement.  CODE STATUS: Full code  DVT Prophylaxis: SCDs  TOTAL TIME TAKING CARE OF THIS PATIENT: 34 minutes.   POSSIBLE D/C IN 2 to 3 DAYS, DEPENDING ON CLINICAL CONDITION.  Ihor AustinPavan Keeara Frees M.D on 03/03/2018 at 1:50 PM  Between 7am to 6pm - Pager - [phone]  After 6pm go to [email or web address] - password [email or web address] [facility]  [facility] [place] [facility]  Office  [phone]  CC: Primary care physician; Leanna SatoMiles, Linda M, MD  Note: This dictation was prepared with Dragon dictation along with smaller phrase technology. Any transcriptional errors that result from this process are unintentional.

## 2018-03-03 NOTE — Plan of Care (Signed)

## 2018-03-03 NOTE — Consult Note (Addendum)
ORTHOPAEDIC CONSULTATION  REQUESTING PHYSICIAN: Ihor AustinPyreddy, Pavan, MD  Chief Complaint: cellulitis left lower extremity s/p ORIF left proximal tibia fracture.    HPI: Alejandro Cook is a 67 y.o. male who was admitted yesterday for complaints of left leg pain and swelling.  Patient is status post ORIF of a left proximal tibia fracture on 01/16/2018 performed by me.  Patient has had difficulty at home.  He has been sleeping in his wheelchair.  Likely because of this the patient has developed professional skin breakdown the posterior calf likely from continuous pressure from the wheelchair leg rest.  Patient had a Doppler in the emergency room showing no DVT.  Patient was treated with Lovenox postoperatively.  Patient is seen in his hospital room today.  He complains of mild left lower extremity pain.  He has AVN of the left hip as well as advanced osteoarthritis of the left knee in addition to his tibial fracture.  Patient states he has been having muscle spasms in the left lower extremity at home.  Patient denies any fever chills or other systemic signs of infection.  Past Medical History:  Diagnosis Date  . Depression   . Hyperlipidemia   . Hypertension    Past Surgical History:  Procedure Laterality Date  . CLOSED REDUCTION TIBIA Left 01/16/2018   Procedure: CLOSED REDUCTION TIBIA VS. ORIF TIBIA;  Surgeon: Juanell FairlyKrasinski, Arzella Rehmann, MD;  Location: [facility] ORS;  Service: Orthopedics;  Laterality: Left;  . ORIF TIBIA FRACTURE Left 01/16/2018   Procedure: OPEN REDUCTION INTERNAL FIXATION (ORIF) TIBIA FRACTURE;  Surgeon: Juanell FairlyKrasinski, Aadvik Roker, MD;  Location: [facility] ORS;  Service: Orthopedics;  Laterality: Left;   Social History   Socioeconomic History  . Marital status: Divorced    Spouse name: Not on file  . Number of children: Not on file  . Years of education: Not on file  . Highest education level: Not on file  Occupational History    Employer: DISABLED    Employer: DISABLED  Social Needs  . Financial  resource strain: Not on file  . Food insecurity:    Worry: Not on file    Inability: Not on file  . Transportation needs:    Medical: Not on file    Non-medical: Not on file  Tobacco Use  . Smoking status: Current Every Day Smoker    Packs/day: 0.50    Types: Cigarettes  . Smokeless tobacco: Never Used  Substance and Sexual Activity  . Alcohol use: Never    Frequency: Never  . Drug use: Yes    Types: Marijuana  . Sexual activity: Not Currently  Lifestyle  . Physical activity:    Days per week: Not on file    Minutes per session: Not on file  . Stress: Not on file  Relationships  . Social connections:    Talks on phone: Not on file    Gets together: Not on file    Attends religious service: Not on file    Active member of club or organization: Not on file    Attends meetings of clubs or organizations: Not on file    Relationship status: Not on file  Other Topics Concern  . Not on file  Social History Narrative  . Not on file   Family History  Problem Relation Age of Onset  . Breast cancer Mother   . CVA Father    No Known Allergies Prior to Admission medications   Medication Sig Start Date End Date Taking? Authorizing Provider  acetaminophen (TYLENOL)  325 MG tablet Take 2 tablets (650 mg total) by mouth every 6 (six) hours as needed for moderate pain (headache). 01/31/18  Yes Houston Siren, MD  diclofenac (VOLTAREN) 75 MG EC tablet Take 1 tablet by mouth 2 (two) times daily. 11/20/17  Yes [provider]  DULoxetine (CYMBALTA) 60 MG capsule Take 1 capsule by mouth daily. 12/19/17  Yes [provider]  lisinopril-hydrochlorothiazide (PRINZIDE,ZESTORETIC) 20-12.5 MG tablet Take 1 tablet by mouth daily. 02/21/18  Yes [provider]  lovastatin (MEVACOR) 40 MG tablet Take 1 tablet (40 mg total) by mouth daily. 01/31/18  Yes Sainani, Rolly Pancake, MD  methocarbamol (ROBAXIN) 500 MG tablet Take 1 tablet (500 mg total) by mouth every 6 (six) hours as  needed for muscle spasms. 01/18/18  Yes Sainani, Rolly Pancake, MD  enoxaparin (LOVENOX) 40 MG/0.4ML injection Inject 0.4 mLs (40 mg total) into the skin daily for 14 days. 01/19/18 02/02/18  Houston Siren, MD  lactulose (CHRONULAC) 10 GM/15ML solution Take 30 mLs (20 g total) by mouth 2 (two) times daily as needed for mild constipation. Patient not taking: Reported on 03/02/2018 01/31/18   Houston Siren, MD  lisinopril (PRINIVIL,ZESTRIL) 20 MG tablet Take 1 tablet (20 mg total) by mouth daily. Patient not taking: Reported on 03/02/2018 02/01/18   Houston Siren, MD  oxyCODONE (OXY IR/ROXICODONE) 5 MG immediate release tablet Take 1 tablet (5 mg total) by mouth every 6 (six) hours as needed for moderate pain. Patient not taking: Reported on 03/02/2018 01/31/18   Houston Siren, MD  polyethylene glycol [facility] Ethelene Hal) packet Take 17 g by mouth daily. Patient not taking: Reported on 03/02/2018 02/01/18   Houston Siren, MD  zolpidem (AMBIEN) 5 MG tablet Take 1 tablet (5 mg total) by mouth at bedtime as needed for sleep. Patient not taking: Reported on 03/02/2018 01/31/18   Houston Siren, MD   Dg Tibia/fibula Left  Result Date: 03/02/2018 CLINICAL DATA:  Redness and swelling, status post orthopedic surgery. EXAM: LEFT TIBIA AND FIBULA - 2 VIEW COMPARISON:  LEFT knee radiograph January 16, 2018 FINDINGS: Lateral plate and screw fixation of proximal tibial fracture, softening of the fracture line. Intact hardware without periprosthetic lucency. Subacute to chronic nondisplaced proximal fibular fracture. Osteopenia. No destructive bony lesions. Advanced vascular calcifications, soft tissues are nonacute. IMPRESSION: 1. S/p proximal tibia ORIF. Disuse osteopenia. Fracture line remains visible. Electronically Signed   By: Awilda Metro M.D.   On: 03/02/2018 19:25   US Venous Img Lower Unilateral Left  Result Date: 03/02/2018 CLINICAL DATA:  LEFT lower extremity pain and swelling, status  post lower extremity ORIF January 16, 2018. EXAM: LEFT LOWER EXTREMITY VENOUS DOPPLER ULTRASOUND TECHNIQUE: Gray-scale sonography with graded compression, as well as color Doppler and duplex ultrasound were performed to evaluate the lower extremity deep venous systems from the level of the common femoral vein and including the common femoral, femoral, profunda femoral, popliteal and calf veins including the posterior tibial, peroneal and gastrocnemius veins when visible. The superficial great saphenous vein was also interrogated. Spectral Doppler was utilized to evaluate flow at rest and with distal augmentation maneuvers in the common femoral, femoral and popliteal veins. COMPARISON:  LEFT lower extremity venous ultrasound January 23, 2018 FINDINGS: Contralateral Common Femoral Vein: Respiratory phasicity is normal and symmetric with the symptomatic side. No evidence of thrombus. Normal compressibility. Common Femoral Vein: No evidence of thrombus. Normal compressibility, respiratory phasicity and response to augmentation. Profunda Femoral Vein: No evidence of  thrombus. Normal compressibility and flow on color Doppler imaging. Femoral Vein: No evidence of thrombus. Normal compressibility, respiratory phasicity and response to augmentation. Popliteal Vein: No evidence of thrombus. Normal compressibility, respiratory phasicity and response to augmentation. Calf Veins: No evidence of thrombus. Normal compressibility and flow on color Doppler imaging. Superficial Great Saphenous Vein: No evidence of thrombus. Normal compressibility. Other Findings:  None. IMPRESSION: No LEFT lower extremity deep vein thrombosis. Electronically Signed   By: Awilda Metro M.D.   On: 03/02/2018 19:27    Positive ROS: All other systems have been reviewed and were otherwise negative with the exception of those mentioned in the HPI and as above.  Physical Exam: General: Alert, no acute distress  MUSCULOSKELETAL: Left lower  extremity: Has dry skin over the anterior aspect of the left lower leg.  Posteriorly there is weeping from superficial skin breakdown.  There is no deep or full-thickness ulcerations.  Patient has faint erythema in the lower extremity.  His incision from his tibial fracture ORIF is well-healed.  There is no surrounding erythema around his incision.  Patient does not have any large effusion or erythema around his knee.  His leg and thigh compartments are soft and compressible.  Distally he is neurovascular intact with intact motor function.  Assessment: Cellulitis of the left lower extremity  Plan: Patient seems to be responding to IV antibiotics.  Continue IV antibiotics as recommended by the hospitalist service.  Continue to elevate the left lower extremity whenever possible.  Patient is ordered for robaxin for muscle spasms.  Patient would also benefit from physical therapy evaluation while an inpatient.  The patient will follow-up in my office in 4 weeks for reevaluation.  He needs to remain toe touch weight bearing on the left lower extremity until follow up.    Juanell Fairly, MD    03/03/2018 12:53 PM

## 2018-03-04 DIAGNOSIS — L03116 Cellulitis of left lower limb: Secondary | ICD-10-CM | POA: Diagnosis not present

## 2018-03-04 LAB — CBC
HCT: 32.3 % — ABNORMAL LOW (ref 39.0–52.0)
HEMOGLOBIN: 10.5 g/dL — AB (ref 13.0–17.0)
MCH: 31 pg (ref 26.0–34.0)
MCHC: 32.5 g/dL (ref 30.0–36.0)
MCV: 95.3 fL (ref 80.0–100.0)
Platelets: 336 10*3/uL (ref 150–400)
RBC: 3.39 MIL/uL — ABNORMAL LOW (ref 4.22–5.81)
RDW: 14.1 % (ref 11.5–15.5)
WBC: 9 10*3/uL (ref 4.0–10.5)
nRBC: 0 % (ref 0.0–0.2)

## 2018-03-04 LAB — FOLATE RBC
Folate, Hemolysate: 330 ng/mL
Folate, RBC: 862 ng/mL (ref >498)
HEMATOCRIT: 38.3 % (ref 37.5–51.0)

## 2018-03-04 MED ORDER — BUPROPION HCL ER (SR) 150 MG PO TB12: 150.0000 mg | ORAL_TABLET | Freq: Two times a day (BID) | ORAL | 0 refills | Status: DC

## 2018-03-04 MED ORDER — OXYCODONE HCL 5 MG PO TABS
10.0000 mg | ORAL_TABLET | Freq: Four times a day (QID) | ORAL | Status: DC | PRN
  Administered 2018-03-04 – 2018-03-05 (×3): 10 mg via ORAL
  Filled 2018-03-04 (×3): qty 2

## 2018-03-04 MED ORDER — CEPHALEXIN 500 MG PO CAPS: 500.0000 mg | ORAL_CAPSULE | Freq: Three times a day (TID) | ORAL | 0 refills | Status: AC

## 2018-03-04 NOTE — Evaluation (Signed)
Physical Therapy Evaluation Patient Details Name: Alejandro Cook MRN: [id number] DOB: 1950-10-02 Today's Date: 03/04/2018   History of Present Illness  Pt is a 67 y.o. male who was admitted for complaints of left leg pain and swelling.  Patient is status post ORIF of a left proximal tibia fracture on 01/16/2018.  Patient has had difficulty at home.  He has been sleeping in his wheelchair.  Likely because of this the patient has developed professional skin breakdown the posterior calf likely from continuous pressure from the wheelchair leg rest.  Patient had a Doppler in the emergency room showing no DVT.  Pt has AVN of the left hip as well as advanced osteoarthritis of the left knee in addition to his tibial fracture.  Patient states he has been having muscle spasms in the left lower extremity at home.  Patient denies any fever chills or other systemic signs of infection.  Assessment includes: LLE cellulitis, L proximal tibal ORIF, leukocytosis, HLD, and tobacco abuse.     Clinical Impression  Pt presents with deficits in strength, transfers, mobility, gait, balance, L hip and knee ROM, and activity tolerance.  Pt required min A with bed mobility tasks and extensive assist with transfer training along with verbal cues for proper sequencing for increased anterior weight shifting and to ensure LLE WB compliance.  Pt was ultimately unable to come to full upright standing presenting with R lateral instability once in partial standing requiring extensive assist for stability.  Of note pt presented with significant deficits in L hip IR ROM and L knee ext ROM.  Extensive time spent on education with pt and nursing on proper positioning while in chair/bed to encourage L hip IR and L knee ext PROM to pt's tolerance.  Discharging to his prior living situation at this time would put the pt at a high risk for further functional decline and L hip/knee contracture progression and would not be appropriate.  Pt will  benefit from PT services in a SNF setting upon discharge to safely address above deficits for decreased caregiver assistance and eventual return to PLOF.         Follow Up Recommendations SNF;Supervision for mobility/OOB    Equipment Recommendations  None recommended by PT    Recommendations for Other Services       Precautions / Restrictions Precautions Precautions: Fall Restrictions Weight Bearing Restrictions: Yes LLE Weight Bearing: Touchdown weight bearing Other Position/Activity Restrictions: Keep LLE elevated as much as possible      Mobility  Bed Mobility Overal bed mobility: Needs Assistance Bed Mobility: Supine to Sit;Sit to Supine     Supine to sit: Min assist Sit to supine: Min assist   General bed mobility comments: Min A for final positioning during sup to/from sit  Transfers Overall transfer level: Needs assistance Equipment used: Rolling walker (2 wheeled) Transfers: Sit to/from Stand Sit to Stand: Max assist         General transfer comment: Pt able to clear the mattress during multiple sit to/from stand attempts but unable to come to full upright position.  Ambulation/Gait             General Gait Details: Unable  Stairs            Wheelchair Mobility    Modified Rankin (Stroke Patients Only)       Balance Overall balance assessment: Needs assistance   Sitting balance-Leahy Scale: Good     Standing balance support: Bilateral upper extremity supported Standing balance-Leahy Scale:  Poor Standing balance comment: Pt with R lateral lean during transfer attempts with extensive assist for stability required                             Pertinent Vitals/Pain Pain Assessment: 0-10 Pain Score: 5  Pain Location: L knee Pain Descriptors / Indicators: Aching;Sore Pain Intervention(s): Premedicated before session;Monitored during session;Limited activity within patient's tolerance    Home Living Family/patient  expects to be discharged to:: Private residence Living Arrangements: Alone Available Help at Discharge: Family;Available PRN/intermittently Type of Home: House Home Access: Ramped entrance     Home Layout: Able to live on main level with bedroom/bathroom;Two level Home Equipment: Walker - 2 wheels;Walker - standard;Bedside commode;Shower seat;Wheelchair - manual      Prior Function Level of Independence: Needs assistance   Gait / Transfers Assistance Needed: Pt has not been ambulating with RW since recent d/c from SNF and has been receiving HHPT.  Prior to recent surgery Pt ambulated household and community distances with 2WW.  ADL's / Homemaking Assistance Needed: Since d/c from the SNF the family has been providing meals.  Prior to surgery: Pt independent for cooking, feeding, sponge bath (due to fear of falling/inability to get LLE into tub). Friend helps with errands, laundry (unable to go down steps into basement with laundry room) and light house cleaning.        Hand Dominance        Extremity/Trunk Assessment   Upper Extremity Assessment Upper Extremity Assessment: Overall WFL for tasks assessed    Lower Extremity Assessment Lower Extremity Assessment: Generalized weakness;LLE deficits/detail LLE Deficits / Details: LLE hip ER contracture and knee flex contractures noted with pt presenting with 0 deg of L hip IR AAROM and lacking grossly 45 deg of L knee ext AAROM although limited by pain LLE: Unable to fully assess due to pain       Communication   Communication: No difficulties  Cognition Arousal/Alertness: Awake/alert Behavior During Therapy: WFL for tasks assessed/performed Overall Cognitive Status: Within Functional Limits for tasks assessed                                        General Comments      Exercises Total Joint Exercises Ankle Circles/Pumps: AROM;Both;10 reps;15 reps Quad Sets: AROM;AAROM;Strengthening;Both;5 reps;10  reps(Gentle AAROM on the LLE) Gluteal Sets: Strengthening;Both;5 reps;10 reps Short Arc Quad: AAROM;Left;5 reps;10 reps Heel Slides: AROM;Right;10 reps Hip ABduction/ADduction: AROM;Right;5 reps Straight Leg Raises: AROM;Right;5 reps Long Arc Quad: AROM;AAROM;Both;10 reps;15 reps(AAROM on the LLE) Knee Flexion: AROM;Both;10 reps;Other (comment) Marching in Standing: AROM;Both;10 reps;Seated Other Exercises Other Exercises: Anterior weight shifting activities in sitting at the EOB Other Exercises: HEP education for BLE APs, QS, GS, and LAQ x 10 each 5-6x/day as well as L hip IR x 10 reps 2-3x/day to pt's tolerance Other Exercises: Positioning education with pt and nursing to encourage L hip IR and L knee ext PROM   Assessment/Plan    PT Assessment Patient needs continued PT services  PT Problem List Decreased strength;Decreased range of motion;Decreased activity tolerance;Decreased balance;Decreased mobility;Decreased knowledge of use of DME;Decreased knowledge of precautions       PT Treatment Interventions DME instruction;Gait training;Functional mobility training;Balance training;Therapeutic exercise;Therapeutic activities;Patient/family education    PT Goals (Current goals can be found in the Care Plan section)  Acute Rehab PT Goals Patient  Stated Goal: To get stronger  PT Goal Formulation: With patient Time For Goal Achievement: 03/17/18 Potential to Achieve Goals: Good    Frequency Min 2X/week   Barriers to discharge Inaccessible home environment;Decreased caregiver support      Co-evaluation               AM-PAC PT "6 Clicks" Mobility  Outcome Measure Help needed turning from your back to your side while in a flat bed without using bedrails?: Total Help needed moving from lying on your back to sitting on the side of a flat bed without using bedrails?: Total Help needed moving to and from a bed to a chair (including a wheelchair)?: Total Help needed standing up  from a chair using your arms (e.g., wheelchair or bedside chair)?: A Lot Help needed to walk in hospital room?: Total Help needed climbing 3-5 steps with a railing? : Total 6 Click Score: 7    End of Session Equipment Utilized During Treatment: Gait belt Activity Tolerance: Patient limited by pain Patient left: in bed;with call bell/phone within reach(Pt declined up in chair) Nurse Communication: Mobility status;Other (comment)(Pt positioning) PT Visit Diagnosis: Unsteadiness on feet (R26.81);Muscle weakness (generalized) (M62.81);Difficulty in walking, not elsewhere classified (R26.2);Pain Pain - Right/Left: Left Pain - part of body: Knee    Time: [id number] PT Time Calculation (min) (ACUTE ONLY): 42 min   Charges:   PT Evaluation $PT Eval Low Complexity: 1 Low PT Treatments $Therapeutic Exercise: 8-22 mins $Therapeutic Activity: 8-22 mins        D. Scott Zacharey Jensen PT, DPT 03/04/18, 10:44 AM

## 2018-03-04 NOTE — NC FL2 (Signed)
[place] MEDICAID FL2 LEVEL OF CARE SCREENING TOOL     IDENTIFICATION  Patient Name: Alejandro Cook Birthdate: Dec 14, 1950 Sex: male Admission Date (Current Location): 03/02/2018  [place] and [place] Number:  Chiropodist and Address:  [facility], [address] [place], [place] [address]      Provider Number: [id number]  Attending Physician Name and Address:  Ihor Austin, MD  Relative Name and Phone Number:  [facility]) [phone]    Current Level of Care: Hospital Recommended Level of Care: Skilled Nursing Facility Prior Approval Number:    Date Approved/Denied:   PASRR Number: [id number]  Discharge Plan: SNF    Current Diagnoses: Patient Active Problem List   Diagnosis Date Noted  . Cellulitis 03/02/2018  . Ileus (HCC)   . Ogilvie's syndrome   . Large bowel obstruction (HCC) 01/23/2018  . Hernia of abdominal wall 01/23/2018  . Tibia fracture 01/14/2018    Orientation RESPIRATION BLADDER Height & Weight     Self, Time, Situation, Place  Normal Continent(Needs assistance with cleaning/bedpan) Weight: 185 lb (83.9 kg) Height:  5\' 11"  (180.3 cm)  BEHAVIORAL SYMPTOMS/MOOD NEUROLOGICAL BOWEL NUTRITION STATUS      Continent(Needs assistance with cleaning/bedpan) Diet(Heart healthy)  AMBULATORY STATUS COMMUNICATION OF NEEDS Skin   Extensive Assist Verbally Skin abrasions, Surgical wounds                       Personal Care Assistance Level of Assistance  Bathing, Feeding, Dressing Bathing Assistance: Maximum assistance Feeding assistance: Independent Dressing Assistance: Maximum assistance     Functional Limitations Info  Sight, Hearing, Speech Sight Info: Adequate Hearing Info: Adequate Speech Info: Adequate    SPECIAL CARE FACTORS FREQUENCY  PT (By licensed PT), OT (By licensed OT)     PT Frequency: Up to 5X per day OT Frequency: Up to 3X per day            Contractures Contractures Info:  Not present    Additional Factors Info  Code Status, Allergies Code Status Info: Full Allergies Info: No known allergies           Current Medications (03/04/2018):  This is the current hospital active medication list Current Facility-Administered Medications  Medication Dose Route Frequency Provider Last Rate Last Dose  . 0.9 %  sodium chloride infusion  250 mL Intravenous PRN Pyreddy, Vivien Rota, MD      . acetaminophen (TYLENOL) tablet 650 mg  650 mg Oral Q6H PRN Ihor Austin, MD   650 mg at 03/03/18 0254   Or  . acetaminophen (TYLENOL) suppository 650 mg  650 mg Rectal Q6H PRN Ihor Austin, MD      . buPROPion (WELLBUTRIN SR) 12 hr tablet 150 mg  150 mg Oral BID Ihor Austin, MD   150 mg at 03/03/18 2157  . ceFEPIme (MAXIPIME) 2 g in sodium chloride 0.9 % 100 mL IVPB  2 g Intravenous Q12H Pyreddy, Vivien Rota, MD 200 mL/hr at 03/04/18 0834 2 g at 03/04/18 0834  . diclofenac (VOLTAREN) EC tablet 75 mg  75 mg Oral BID Ihor Austin, MD   75 mg at 03/04/18 0857  . DULoxetine (CYMBALTA) DR capsule 60 mg  60 mg Oral Daily Ihor Austin, MD   60 mg at 03/04/18 0905  . enoxaparin (LOVENOX) injection 40 mg  40 mg Subcutaneous Q24H Ihor Austin, MD   40 mg at 03/03/18 2213  . lactulose (CHRONULAC) 10 GM/15ML solution 20 g  20 g  Oral BID PRN Ihor AustinPyreddy, Pavan, MD      . lidocaine (LIDODERM) 5 % 1 patch  1 patch Transdermal Q24H Ihor AustinPyreddy, Pavan, MD   1 patch at 03/03/18 1426  . lisinopril (PRINIVIL,ZESTRIL) tablet 20 mg  20 mg Oral Daily Pyreddy, Vivien RotaPavan, MD   20 mg at 03/04/18 0857  . methocarbamol (ROBAXIN) tablet 500 mg  500 mg Oral Q6H PRN Ihor AustinPyreddy, Pavan, MD   500 mg at 03/04/18 0857  . nicotine (NICODERM CQ - dosed in mg/24 hours) patch 21 mg  21 mg Transdermal Daily Pyreddy, Vivien RotaPavan, MD   21 mg at 03/04/18 0857  . ondansetron (ZOFRAN) tablet 4 mg  4 mg Oral Q6H PRN Ihor AustinPyreddy, Pavan, MD       Or  . ondansetron (ZOFRAN) injection 4 mg  4 mg Intravenous Q6H PRN Pyreddy, Pavan, MD      . oxyCODONE  (Oxy IR/ROXICODONE) immediate release tablet 5 mg  5 mg Oral Q6H PRN Ihor AustinPyreddy, Pavan, MD   5 mg at 03/04/18 0857  . polyethylene glycol (MIRALAX / GLYCOLAX) packet 17 g  17 g Oral Daily Ihor AustinPyreddy, Pavan, MD   17 g at 03/03/18 0938  . pravastatin (PRAVACHOL) tablet 20 mg  20 mg Oral q1800 Ihor AustinPyreddy, Pavan, MD   20 mg at 03/03/18 1824  . sodium chloride flush (NS) 0.9 % injection 3 mL  3 mL Intravenous Q12H Ihor AustinPyreddy, Pavan, MD   3 mL at 03/04/18 0851  . sodium chloride flush (NS) 0.9 % injection 3 mL  3 mL Intravenous PRN Pyreddy, Pavan, MD      . zolpidem (AMBIEN) tablet 5 mg  5 mg Oral QHS PRN Ihor AustinPyreddy, Pavan, MD         Discharge Medications: Please see discharge summary for a list of discharge medications.  Relevant Imaging Results:  Relevant Lab Results:   Additional Information SS# [id number]; IV ABX  Judi CongKaren M Idelia Caudell, LCSW

## 2018-03-04 NOTE — Care Management Note (Signed)
Case Management Note  Patient Details  Name: Alejandro Cook MRN: [id number] Date of Birth: 01-04-1951  Subjective/Objective:  Patient to be discharged per MD order. Orders in place for home health services. Patient currently open to [facility] and prefers to resume care with them. [facility] Patient requires no DME. Family to provide transport.  Buddy DutyJosh Ming Mcmannis RN BSN RNCM [phone]                    Action/Plan:   Expected Discharge Date:  03/04/18               Expected Discharge Plan:  Home w Home Health Services  In-House Referral:     Discharge planning Services  CM Consult  Post Acute Care Choice:  Home Health Choice offered to:  Patient  DME Arranged:    DME Agency:     HH Arranged:  RN, PT HH Agency:  [facility]  Status of Service:  Completed, signed off  If discussed at Long Length of Stay Meetings, dates discussed:    Additional Comments:  Virgel ManifoldJosh A Camari Quintanilla, RN 03/04/2018, 10:21 AM

## 2018-03-04 NOTE — Discharge Summary (Signed)
[facility] - [place] [facility]   PATIENT NAME: Alejandro Cook    MR#:  [id number]  DATE OF BIRTH:  1950-09-01  DATE OF ADMISSION:  03/02/2018 ADMITTING PHYSICIAN: Ihor Austin, MD  DATE OF DISCHARGE: 03/04/2018  PRIMARY CARE PHYSICIAN: Leanna Sato, MD   ADMISSION DIAGNOSIS:  Neuropathy [G62.9] Cellulitis of left leg [L03.116] History of recent left tibial open reduction internal fixation Leukocytosis Hyperlipidemia Tobacco abuse DISCHARGE DIAGNOSIS:  Cellulitis of left leg Neuropathy Leukocytosis Tobacco abuse History of recent left tibial open reduction internal fixation  SECONDARY DIAGNOSIS:   Past Medical History:  Diagnosis Date  . Depression   . Hyperlipidemia   . Hypertension      ADMITTING HISTORY Alejandro Cook  is a 68 y.o. male with a known history of hyperlipidemia, hypertension, recent history of open reduction internal fixation of tibial fracture left side presented to the emergency room with redness and pain in the left lower extremity.  Patient has some redness and swelling in the left shin area.  Was worked up with Doppler ultrasound of lower extremity which showed no DVT.  Patient received physical therapy for the last 1 month.  He also received Lovenox shots subcutaneously and has completed.  He was evaluated in the emergency room WBC count was high and patient was started on antibiotics.  HOSPITAL COURSE:  Patient was worked up with Doppler ultrasound of left leg which showed no DVT.  Imaging studies did not reveal any new fracture, hardware intact in the left lower extremity.  Patient was started on IV vancomycin and Zosyn antibiotics initially.  His leukocytosis improved.  Cultures did not reveal any growth.  Cellulitis in the left lower extremity improved.  Tobacco cessation was counseled during the stay in the hospital.  Patient received physical therapy.  DVT prophylaxis was given with subcu Lovenox.  Follow-up was done with  orthopedic service who recommended to continue antibiotic and physical therapy on discharge.  CONSULTS OBTAINED:  Treatment Team:  Juanell Fairly, MD  DRUG ALLERGIES:  No Known Allergies  DISCHARGE MEDICATIONS:   Allergies as of 03/04/2018   No Known Allergies     Medication List    STOP taking these medications   enoxaparin 40 MG/0.4ML injection Commonly known as:  LOVENOX   lactulose 10 GM/15ML solution Commonly known as:  CHRONULAC   lisinopril 20 MG tablet Commonly known as:  PRINIVIL,ZESTRIL   oxyCODONE 5 MG immediate release tablet Commonly known as:  Oxy IR/ROXICODONE   zolpidem 5 MG tablet Commonly known as:  AMBIEN     TAKE these medications   acetaminophen 325 MG tablet Commonly known as:  TYLENOL Take 2 tablets (650 mg total) by mouth every 6 (six) hours as needed for moderate pain (headache).   buPROPion 150 MG 12 hr tablet Commonly known as:  WELLBUTRIN SR Take 1 tablet (150 mg total) by mouth 2 (two) times daily.   cephALEXin 500 MG capsule Commonly known as:  KEFLEX Take 1 capsule (500 mg total) by mouth 3 (three) times daily for 5 days.   diclofenac 75 MG EC tablet Commonly known as:  VOLTAREN Take 1 tablet by mouth 2 (two) times daily.   DULoxetine 60 MG capsule Commonly known as:  CYMBALTA Take 1 capsule by mouth daily.   lisinopril-hydrochlorothiazide 20-12.5 MG tablet Commonly known as:  PRINZIDE,ZESTORETIC Take 1 tablet by mouth daily.   lovastatin 40 MG tablet Commonly known as:  MEVACOR Take 1 tablet (40 mg total) by mouth daily.  methocarbamol 500 MG tablet Commonly known as:  ROBAXIN Take 1 tablet (500 mg total) by mouth every 6 (six) hours as needed for muscle spasms.   polyethylene glycol packet Commonly known as:  MIRALAX / GLYCOLAX Take 17 g by mouth daily.       Today  Patient seen today Decreased pain swelling and redness in the left leg Tolerating diet well Will be discharged home on oral Keflex  antibiotic along with home health services  VITAL SIGNS:  Blood pressure 132/82, pulse 81, temperature 97.7 F (36.5 C), temperature source Oral, resp. rate 18, height 5\' 11"  (1.803 m), weight 83.9 kg, SpO2 95 %.  I/O:    Intake/Output Summary (Last 24 hours) at 03/04/2018 1034 Last data filed at 03/04/2018 1006 Gross per 24 hour  Intake 949.89 ml  Output 900 ml  Net 49.89 ml    PHYSICAL EXAMINATION:  Physical Exam  GENERAL:  67 y.o.-year-old patient lying in the bed with no acute distress.  LUNGS: Normal breath sounds bilaterally, no wheezing, rales,rhonchi or crepitation. No use of accessory muscles of respiration.  CARDIOVASCULAR: S1, S2 normal. No murmurs, rubs, or gallops.  ABDOMEN: Soft, non-tender, non-distended. Bowel sounds present. No organomegaly or mass.  NEUROLOGIC: Moves all 4 extremities. PSYCHIATRIC: The patient is alert and oriented x 3.  SKIN: No obvious rash, lesion, or ulcer.   DATA REVIEW:   CBC Recent Labs  Lab 03/04/18 0454  WBC 9.0  HGB 10.5*  HCT 32.3*  PLT 336    Chemistries  Recent Labs  Lab 03/02/18 1806 03/03/18 0400  NA 138 137  K 4.3 3.9  CL 103 105  CO2 29 28  GLUCOSE 97 100*  BUN 11 12  CREATININE 0.65 0.77  CALCIUM 9.1 8.3*  AST 15  --   ALT 15  --   ALKPHOS 95  --   BILITOT 0.4  --     Cardiac Enzymes No results for input(s): TROPONINI in the last 168 hours.  Microbiology Results  Results for orders placed or performed during the hospital encounter of 01/14/18  Surgical PCR screen     Status: None   Collection Time: 01/15/18  2:34 PM  Result Value Ref Range Status   MRSA, PCR NEGATIVE NEGATIVE Final   Staphylococcus aureus NEGATIVE NEGATIVE Final    Comment: (NOTE) The Xpert SA Assay (FDA approved for NASAL specimens in patients 57 years of age and older), is one component of a comprehensive surveillance program. It is not intended to diagnose infection nor to guide or monitor treatment. Performed at [facility], [address] [place], [place] [address]     RADIOLOGY:  Dg Tibia/fibula Left  Result Date: 03/02/2018 CLINICAL DATA:  Redness and swelling, status post orthopedic surgery. EXAM: LEFT TIBIA AND FIBULA - 2 VIEW COMPARISON:  LEFT knee radiograph January 16, 2018 FINDINGS: Lateral plate and screw fixation of proximal tibial fracture, softening of the fracture line. Intact hardware without periprosthetic lucency. Subacute to chronic nondisplaced proximal fibular fracture. Osteopenia. No destructive bony lesions. Advanced vascular calcifications, soft tissues are nonacute. IMPRESSION: 1. S/p proximal tibia ORIF. Disuse osteopenia. Fracture line remains visible. Electronically Signed   By: Awilda Metro M.D.   On: 03/02/2018 19:25   US Venous Img Lower Unilateral Left  Result Date: 03/02/2018 CLINICAL DATA:  LEFT lower extremity pain and swelling, status post lower extremity ORIF January 16, 2018. EXAM: LEFT LOWER EXTREMITY VENOUS DOPPLER ULTRASOUND TECHNIQUE: Gray-scale sonography with graded compression, as well as  color Doppler and duplex ultrasound were performed to evaluate the lower extremity deep venous systems from the level of the common femoral vein and including the common femoral, femoral, profunda femoral, popliteal and calf veins including the posterior tibial, peroneal and gastrocnemius veins when visible. The superficial great saphenous vein was also interrogated. Spectral Doppler was utilized to evaluate flow at rest and with distal augmentation maneuvers in the common femoral, femoral and popliteal veins. COMPARISON:  LEFT lower extremity venous ultrasound January 23, 2018 FINDINGS: Contralateral Common Femoral Vein: Respiratory phasicity is normal and symmetric with the symptomatic side. No evidence of thrombus. Normal compressibility. Common Femoral Vein: No evidence of thrombus. Normal compressibility, respiratory phasicity and response to augmentation. Profunda  Femoral Vein: No evidence of thrombus. Normal compressibility and flow on color Doppler imaging. Femoral Vein: No evidence of thrombus. Normal compressibility, respiratory phasicity and response to augmentation. Popliteal Vein: No evidence of thrombus. Normal compressibility, respiratory phasicity and response to augmentation. Calf Veins: No evidence of thrombus. Normal compressibility and flow on color Doppler imaging. Superficial Great Saphenous Vein: No evidence of thrombus. Normal compressibility. Other Findings:  None. IMPRESSION: No LEFT lower extremity deep vein thrombosis. Electronically Signed   By: Awilda Metroourtnay  Bloomer M.D.   On: 03/02/2018 19:27    Follow up with PCP in 1 week.  Management plans discussed with the patient, family and they are in agreement.  CODE STATUS:  Full code    Code Status Orders  (From admission, onward)         Start     Ordered   03/02/18 2124  Full code  Continuous     03/02/18 2123        Code Status History    Date Active Date Inactive Code Status Order ID Comments User Context   01/23/2018 2153 01/31/2018 1837 Full Code [id number]  Altamese DillingVachhani, Vaibhavkumar, MD Inpatient   01/14/2018 2050 01/18/2018 1917 Full Code [id number]  Ihor AustinPyreddy, Esmerelda Finnigan, MD Inpatient    Advance Directive Documentation     Most Recent Value  Type of Advance Directive  Healthcare Power of Attorney, Living will  Pre-existing out of facility DNR order (yellow form or pink MOST form)  -  "MOST" Form in Place?  -      TOTAL TIME TAKING CARE OF THIS PATIENT ON DAY OF DISCHARGE: more than 34 minutes.   Ihor AustinPavan Henry Demeritt M.D on 03/04/2018 at 10:34 AM  Between 7am to 6pm - Pager - [phone]  After 6pm go to [email or web address] - password [email or web address]  [facility] [place] [facility]  Office  [phone]  CC: Primary care physician; Leanna SatoMiles, Linda M, MD  Note: This dictation was prepared with Dragon dictation along with smaller phrase technology. Any transcriptional errors that result from  this process are unintentional.

## 2018-03-04 NOTE — Progress Notes (Signed)
Pt states he is "in severe pain after PT". MD Pyreddy notified. Orders received to increase oxycodone to  10 mg Q6 PRN. Order placed.

## 2018-03-04 NOTE — Progress Notes (Addendum)
Subjective:  Postop day #2.  Patient reports increased pain after physical therapy today.  Patient requiring increased doses of oxycodone.  Pain involves the knee and hip joints primarily, not the lower leg.  Objective:   VITALS:   Vitals:   03/03/18 0814 03/03/18 1620 03/03/18 2309 03/04/18 0851  BP: (!) 141/83 135/77 133/82 132/82  Pulse: 73 68 72 81  Resp:   18 18  Temp: 98.1 F (36.7 C) 98.5 F (36.9 C) 98 F (36.7 C) 97.7 F (36.5 C)  TempSrc: Oral Oral Oral Oral  SpO2: 93% 95% 97% 95%  Weight:      Height:        PHYSICAL EXAM: Left lower extremity: Patient's tibial incision is well-healed.  There is no erythema ecchymosis swelling or deformity seen.  Patient has faint erythema over the left lower extremity continues to improve.  His leg compartments are soft and compressible. Neurovascular intact Sensation intact distally Intact pulses distally Compartment soft  LABS  Results for orders placed or performed during the hospital encounter of 03/02/18 (from the past 24 hour(s))  CBC     Status: Abnormal   Collection Time: 03/04/18  4:54 AM  Result Value Ref Range   WBC 9.0 4.0 - 10.5 K/uL   RBC 3.39 (L) 4.22 - 5.81 MIL/uL   Hemoglobin 10.5 (L) 13.0 - 17.0 g/dL   HCT 16.132.3 (L) 09.639.0 - 04.552.0 %   MCV 95.3 80.0 - 100.0 fL   MCH 31.0 26.0 - 34.0 pg   MCHC 32.5 30.0 - 36.0 g/dL   RDW 40.914.1 81.111.5 - 91.415.5 %   Platelets 336 150 - 400 K/uL   nRBC 0.0 0.0 - 0.2 %    Dg Tibia/fibula Left  Result Date: 03/02/2018 CLINICAL DATA:  Redness and swelling, status post orthopedic surgery. EXAM: LEFT TIBIA AND FIBULA - 2 VIEW COMPARISON:  LEFT knee radiograph January 16, 2018 FINDINGS: Lateral plate and screw fixation of proximal tibial fracture, softening of the fracture line. Intact hardware without periprosthetic lucency. Subacute to chronic nondisplaced proximal fibular fracture. Osteopenia. No destructive bony lesions. Advanced vascular calcifications, soft tissues are nonacute.  IMPRESSION: 1. S/p proximal tibia ORIF. Disuse osteopenia. Fracture line remains visible. Electronically Signed   By: Awilda Metroourtnay  Bloomer M.D.   On: 03/02/2018 19:25   [place] Venous Img Lower Unilateral Left  Result Date: 03/02/2018 CLINICAL DATA:  LEFT lower extremity pain and swelling, status post lower extremity ORIF January 16, 2018. EXAM: LEFT LOWER EXTREMITY VENOUS DOPPLER ULTRASOUND TECHNIQUE: Gray-scale sonography with graded compression, as well as color Doppler and duplex ultrasound were performed to evaluate the lower extremity deep venous systems from the level of the common femoral vein and including the common femoral, femoral, profunda femoral, popliteal and calf veins including the posterior tibial, peroneal and gastrocnemius veins when visible. The superficial great saphenous vein was also interrogated. Spectral Doppler was utilized to evaluate flow at rest and with distal augmentation maneuvers in the common femoral, femoral and popliteal veins. COMPARISON:  LEFT lower extremity venous ultrasound January 23, 2018 FINDINGS: Contralateral Common Femoral Vein: Respiratory phasicity is normal and symmetric with the symptomatic side. No evidence of thrombus. Normal compressibility. Common Femoral Vein: No evidence of thrombus. Normal compressibility, respiratory phasicity and response to augmentation. Profunda Femoral Vein: No evidence of thrombus. Normal compressibility and flow on color Doppler imaging. Femoral Vein: No evidence of thrombus. Normal compressibility, respiratory phasicity and response to augmentation. Popliteal Vein: No evidence of thrombus. Normal compressibility, respiratory phasicity and response  to augmentation. Calf Veins: No evidence of thrombus. Normal compressibility and flow on color Doppler imaging. Superficial Great Saphenous Vein: No evidence of thrombus. Normal compressibility. Other Findings:  None. IMPRESSION: No LEFT lower extremity deep vein thrombosis. Electronically  Signed   By: Awilda Metro M.D.   On: 03/02/2018 19:27    Assessment/Plan:     Active Problems:   Cellulitis  Continue IV antibiotics as ordered by the hospitalist service.  Continue physical therapy as patient can tolerate.  [facility].  Patient will follow up with me in 4 weeks in the office and will remain toe touch weightbearing until that time.    Juanell Fairly , MD 03/04/2018, 3:10 PM

## 2018-03-04 NOTE — Progress Notes (Signed)
[facility] -  at [facility]   PATIENT NAME: Alejandro Cook    MR#:  [id number]  DATE OF BIRTH:  18-Jul-1950  SUBJECTIVE:  CHIEF COMPLAINT:   Chief Complaint  Patient presents with  . Post-op Problem  Patient seen today Left lower extremity redness and swelling is improved Decreased pain in the left lower extremity Tolerating diet well  REVIEW OF SYSTEMS:    ROS  CONSTITUTIONAL: No documented fever. No fatigue, weakness. No weight gain, no weight loss.  EYES: No blurry or double vision.  ENT: No tinnitus. No postnasal drip. No redness of the oropharynx.  RESPIRATORY: No cough, no wheeze, no hemoptysis. No dyspnea.  CARDIOVASCULAR: No chest pain. No orthopnea. No palpitations. No syncope.  GASTROINTESTINAL: No nausea, no vomiting or diarrhea. No abdominal pain. No melena or hematochezia.  GENITOURINARY: No dysuria or hematuria.  ENDOCRINE: No polyuria or nocturia. No heat or cold intolerance.  HEMATOLOGY: No anemia. No bruising. No bleeding.  INTEGUMENTARY: Decreased swelling and pain in the left leg MUSCULOSKELETAL: No arthritis. No swelling. No gout.  NEUROLOGIC: No numbness, tingling, or ataxia. No seizure-type activity.  PSYCHIATRIC: No anxiety. No insomnia. No ADD.   DRUG ALLERGIES:  No Known Allergies  VITALS:  Blood pressure 132/82, pulse 81, temperature 97.7 F (36.5 C), temperature source Oral, resp. rate 18, height 5\' 11"  (1.803 m), weight 83.9 kg, SpO2 95 %.  PHYSICAL EXAMINATION:   Physical Exam  GENERAL:  67 y.o.-year-old patient lying in the bed with no acute distress.  EYES: Pupils equal, round, reactive to light and accommodation. No scleral icterus. Extraocular muscles intact.  HEENT: Head atraumatic, normocephalic. Oropharynx and nasopharynx clear.  NECK:  Supple, no jugular venous distention. No thyroid enlargement, no tenderness.  LUNGS: Normal breath sounds bilaterally, no wheezing, rales, rhonchi. No use of accessory muscles  of respiration.  CARDIOVASCULAR: S1, S2 normal. No murmurs, rubs, or gallops.  ABDOMEN: Soft, nontender, nondistended. Bowel sounds present. No organomegaly or mass.  Genitourinary scrotal erythema EXTREMITIES: No cyanosis, clubbing  Left lower extremity decreased swelling and redness  Decreased tenderness left leg NEUROLOGIC: Cranial nerves II through XII are intact. No focal Motor or sensory deficits b/l.   PSYCHIATRIC: The patient is alert and oriented x 3.  SKIN: No obvious rash, lesion, or ulcer.   LABORATORY PANEL:   CBC Recent Labs  Lab 03/04/18 0454  WBC 9.0  HGB 10.5*  HCT 32.3*  PLT 336   ------------------------------------------------------------------------------------------------------------------ Chemistries  Recent Labs  Lab 03/02/18 1806 03/03/18 0400  NA 138 137  K 4.3 3.9  CL 103 105  CO2 29 28  GLUCOSE 97 100*  BUN 11 12  CREATININE 0.65 0.77  CALCIUM 9.1 8.3*  AST 15  --   ALT 15  --   ALKPHOS 95  --   BILITOT 0.4  --    ------------------------------------------------------------------------------------------------------------------  Cardiac Enzymes No results for input(s): TROPONINI in the last 168 hours. ------------------------------------------------------------------------------------------------------------------  RADIOLOGY:  Dg Tibia/fibula Left  Result Date: 03/02/2018 CLINICAL DATA:  Redness and swelling, status post orthopedic surgery. EXAM: LEFT TIBIA AND FIBULA - 2 VIEW COMPARISON:  LEFT knee radiograph January 16, 2018 FINDINGS: Lateral plate and screw fixation of proximal tibial fracture, softening of the fracture line. Intact hardware without periprosthetic lucency. Subacute to chronic nondisplaced proximal fibular fracture. Osteopenia. No destructive bony lesions. Advanced vascular calcifications, soft tissues are nonacute. IMPRESSION: 1. S/p proximal tibia ORIF. Disuse osteopenia. Fracture line remains visible. Electronically  Signed   By: Pernell Dupre  Bloomer M.D.   On: 03/02/2018 19:25   [place] Venous Img Lower Unilateral Left  Result Date: 03/02/2018 CLINICAL DATA:  LEFT lower extremity pain and swelling, status post lower extremity ORIF January 16, 2018. EXAM: LEFT LOWER EXTREMITY VENOUS DOPPLER ULTRASOUND TECHNIQUE: Gray-scale sonography with graded compression, as well as color Doppler and duplex ultrasound were performed to evaluate the lower extremity deep venous systems from the level of the common femoral vein and including the common femoral, femoral, profunda femoral, popliteal and calf veins including the posterior tibial, peroneal and gastrocnemius veins when visible. The superficial great saphenous vein was also interrogated. Spectral Doppler was utilized to evaluate flow at rest and with distal augmentation maneuvers in the common femoral, femoral and popliteal veins. COMPARISON:  LEFT lower extremity venous ultrasound January 23, 2018 FINDINGS: Contralateral Common Femoral Vein: Respiratory phasicity is normal and symmetric with the symptomatic side. No evidence of thrombus. Normal compressibility. Common Femoral Vein: No evidence of thrombus. Normal compressibility, respiratory phasicity and response to augmentation. Profunda Femoral Vein: No evidence of thrombus. Normal compressibility and flow on color Doppler imaging. Femoral Vein: No evidence of thrombus. Normal compressibility, respiratory phasicity and response to augmentation. Popliteal Vein: No evidence of thrombus. Normal compressibility, respiratory phasicity and response to augmentation. Calf Veins: No evidence of thrombus. Normal compressibility and flow on color Doppler imaging. Superficial Great Saphenous Vein: No evidence of thrombus. Normal compressibility. Other Findings:  None. IMPRESSION: No LEFT lower extremity deep vein thrombosis. Electronically Signed   By: Awilda Metroourtnay  Bloomer M.D.   On: 03/02/2018 19:27     ASSESSMENT AND PLAN:   67 year old male  patient with history of open reduction internal fixation of left tibial fracture, hypertension, hyperlipidemia presented to the emergency room for pain, redness in the left leg  -Left leg cellulitis improving  Continue IV cefepime antibiotic Cultures no growth so far  -Recent left tibial open reduction internal fixation surgery Orthopedic follow-up appreciated Physical therapy evaluation appreciated  -Ambulatory dysfunction Patient lives alone unable to take care of himself at home Physical therapy evaluated and recommends rehab Rehab placement Social worker consulted  -DVT prophylaxis subcu Lovenox daily  -Leukocytosis proved  -Hyperlipidemia Resume home dose statin medication  -Tobacco abuse Tobacco cessation counseled the patient for 6 minutes Nicotine patch offered  All the records are reviewed and case discussed with Care Management/Social Worker. Management plans discussed with the patient, family and they are in agreement.  CODE STATUS: Full code  DVT Prophylaxis: SCDs  TOTAL TIME TAKING CARE OF THIS PATIENT: 33 minutes.   POSSIBLE D/C IN 2 to 3 DAYS, DEPENDING ON CLINICAL CONDITION.  Ihor AustinPavan Joplin Canty M.D on 03/04/2018 at 11:37 AM  Between 7am to 6pm - Pager - [phone]  After 6pm go to [email or web address] - password [email or web address] [facility]  [facility] [place] [facility]  Office  [phone]  CC: Primary care physician; Leanna SatoMiles, Linda M, MD  Note: This dictation was prepared with Dragon dictation along with smaller phrase technology. Any transcriptional errors that result from this process are unintentional.

## 2018-03-04 NOTE — Clinical Social Work Note (Signed)
Clinical Social Work Assessment  Patient Details  Name: Alejandro Cook MRN: [id number] Date of Birth: 22-Apr-1950  Date of referral:  03/04/18               Reason for consult:  Facility Placement                Permission sought to share information with:  Chartered certified accountant granted to share information::  Yes, Verbal Permission Granted  Name::        Agency::  [facility] area SNFs  Relationship::     Contact Information:     Housing/Transportation Living arrangements for the past 2 months:  [place] of Information:  Patient, Medical Team Patient Interpreter Needed:  None Criminal Activity/Legal Involvement Pertinent to Current Situation/Hospitalization:  No - Comment as needed Significant Relationships:  Adult Children Lives with:  Self Do you feel safe going back to the place where you live?  Yes Need for family participation in patient care:  No (Coment)  Care giving concerns:  PT recommendation for SNF   Social Worker assessment / plan:  The CSW met with the patient at bedside to discuss discharge planning. The patient shared that he was last at SNF 12 days ago, and since returning home, he has not had any support from his son. The patient has cellulitis of his wounds, and he is worried about further infection should he return home. He therapy goals are be able to ambulate with a walker and to be able to transfer to a bedside commode prior to return home. The CSW explained the referral process and insurance authorization process, and the patient indicated his preference is [facility].   The CSW has begun the referral process and will update the patient with bed offers as available. The patient will discharge most likely tomorrow.  Employment status:  Retired Nurse, adult PT Recommendations:  [place] / Referral to community resources:  [place]  Patient/Family's  Response to care:  The patient thanked the CSW.  Patient/Family's Understanding of and Emotional Response to Diagnosis, Current Treatment, and Prognosis:  The patient understands that he is struggling with self-care and would benefit from SNF. The patient is in agreement with SNF placement.  Emotional Assessment Appearance:  Appears stated age Attitude/Demeanor/Rapport:  Gracious, Engaged Affect (typically observed):  Pleasant, Stable Orientation:  Oriented to Self, Oriented to Place, Oriented to  Time, Oriented to Situation Alcohol / Substance use:  Never Used Psych involvement (Current and /or in the community):  No (Comment)  Discharge Needs  Concerns to be addressed:  Care Coordination, Discharge Planning Concerns Readmission within the last 30 days:  Yes Current discharge risk:  Chronically ill, Physical Impairment, Lives alone Barriers to Discharge:  Continued Medical Work up   [employer], LCSW 03/04/2018, 1:32 PM

## 2018-03-05 ENCOUNTER — Ambulatory Visit: Payer: Self-pay | Admitting: Surgery

## 2018-03-05 DIAGNOSIS — L03116 Cellulitis of left lower limb: Secondary | ICD-10-CM | POA: Diagnosis not present

## 2018-03-05 MED ORDER — OXYCODONE HCL 5 MG PO TABS: 5.0000 mg | ORAL_TABLET | Freq: Four times a day (QID) | ORAL | 0 refills | Status: DC | PRN

## 2018-03-05 NOTE — Progress Notes (Signed)
Physical Therapy Treatment Patient Details Name: Alejandro Cook MRN: [id number] DOB: [birth year] Today's Date: 03/05/2018    History of Present Illness Pt is a 67 y.o. male who was admitted for complaints of left leg pain and swelling.  Patient is status post ORIF of a left proximal tibia fracture on 01/16/2018.  Patient has had difficulty at home.  He has been sleeping in his wheelchair.  Likely because of this the patient has developed skin breakdown the posterior calf likely from continuous pressure from the wheelchair leg rest.  Patient had a Doppler in the emergency room showing no DVT.  Pt has AVN of the left hip as well as advanced osteoarthritis of the left knee in addition to his tibial fracture.  Patient states he has been having muscle spasms in the left lower extremity at home.  Patient denies any fever chills or other systemic signs of infection.  Assessment includes: LLE cellulitis, L proximal tibal ORIF, leukocytosis, HLD, and tobacco abuse.    PT Comments    Mr. Alejandro Cook made very modest progress with mobility this session.  He requires min>max +2 assist with bed mobility and reminder cues to avoid WB through LLE with supine>sit.  Pt requires max +2 assist to achieve ~75% of standing, unable to achieve full upright standing due to RLE weakness and LLL TDWB restrictions.  Pt is motivated to become as independent as possible.  SNF remains most appropriate d/c plan at this time.   Follow Up Recommendations  SNF;Supervision for mobility/OOB     Equipment Recommendations  None recommended by PT    Recommendations for Other Services       Precautions / Restrictions Precautions Precautions: Fall;Other (comment) Precaution Comments: vertigo at baseline Restrictions Weight Bearing Restrictions: Yes LLE Weight Bearing: Touchdown weight bearing Other Position/Activity Restrictions: Keep LLE elevated as much as possible    Mobility  Bed Mobility Overal bed mobility: Needs  Assistance Bed Mobility: Supine to Sit;Sit to Supine     Supine to sit: Min assist;HOB elevated Sit to supine: Max assist;+2 for physical assistance   General bed mobility comments: Assist to elevate trunk with heavy use of bed rail for supine>sit.  Max +2 assist to return to supine from sitting with assist for all aspects of bed mobility.   Transfers Overall transfer level: Needs assistance Equipment used: Rolling walker (2 wheeled) Transfers: Sit to/from Stand Sit to Stand: Max assist;+2 physical assistance;From elevated surface         General transfer comment: On first attempt at sit>stand with bed elevated the pt initiates sit>stand but R foot slips and pt returns to sitting.  On second attempt pt required max +2 assist again to achieve ~75% of standing but pt lacks the strength in RLE to push into full upright standing.   Ambulation/Gait         Gait velocity: Not safe to attempt at this time.        Stairs             Wheelchair Mobility    Modified Rankin (Stroke Patients Only)       Balance Overall balance assessment: Needs assistance Sitting-balance support: Feet supported;Bilateral upper extremity supported Sitting balance-Leahy Scale: Poor Sitting balance - Comments: Pt relies on BUE support to maintain balance sitting EOB   Standing balance support: Bilateral upper extremity supported;During functional activity Standing balance-Leahy Scale: Poor Standing balance comment: Pt relies on max +2 assist with BUE support for attempted sit>stand  Cognition Arousal/Alertness: Awake/alert Behavior During Therapy: WFL for tasks assessed/performed Overall Cognitive Status: Within Functional Limits for tasks assessed                                        Exercises Other Exercises Other Exercises: Discussed proper positioning of LLE for proper healing as well as propped blanket under L ankle after pt  extending LLE as much as possible.  Pillow placed along lateral LLE to promote neutral rotation of L hip.      General Comments General comments (skin integrity, edema, etc.): Pt with h/o vertigo at baseline.  BP monitored during session.  Sitting EOB: 147/83, pulse 84, SpO2 95%.  Taken again after several minutes of sitting EOB: 135/96.  Supine at end of session: 144/84      Pertinent Vitals/Pain Pain Assessment: Faces Faces Pain Scale: Hurts little more Pain Location: L knee Pain Descriptors / Indicators: Aching;Spasm Pain Intervention(s): Limited activity within patient's tolerance;Monitored during session;Utilized relaxation techniques    Home Living                      Prior Function            PT Goals (current goals can now be found in the care plan section) Acute Rehab PT Goals Patient Stated Goal: To get stronger  PT Goal Formulation: With patient Time For Goal Achievement: 03/17/18 Potential to Achieve Goals: Fair Progress towards PT goals: Progressing toward goals(very modestly)    Frequency    Min 2X/week      PT Plan Current plan remains appropriate    Co-evaluation              AM-PAC PT "6 Clicks" Mobility   Outcome Measure  Help needed turning from your back to your side while in a flat bed without using bedrails?: Total Help needed moving from lying on your back to sitting on the side of a flat bed without using bedrails?: Total Help needed moving to and from a bed to a chair (including a wheelchair)?: Total Help needed standing up from a chair using your arms (e.g., wheelchair or bedside chair)?: Total Help needed to walk in hospital room?: Total Help needed climbing 3-5 steps with a railing? : Total 6 Click Score: 6    End of Session Equipment Utilized During Treatment: Gait belt Activity Tolerance: Patient limited by pain;Patient limited by fatigue Patient left: in bed;with call bell/phone within reach;with bed alarm set Nurse  Communication: Mobility status;Other (comment)(pt's dizziness ) PT Visit Diagnosis: Unsteadiness on feet (R26.81);Muscle weakness (generalized) (M62.81);Difficulty in walking, not elsewhere classified (R26.2);Pain Pain - Right/Left: Left Pain - part of body: Knee     Time: 1020-1106 PT Time Calculation (min) (ACUTE ONLY): 46 min  Charges:  $Therapeutic Activity: 38-52 mins                     Session was performed by student PT, Lisbeth Renshaw, and directed, overseen, and documented by this PT.  Encarnacion Chu PT, DPT 03/05/2018, 11:36 AM

## 2018-03-05 NOTE — Clinical Social Work Placement (Addendum)
   CLINICAL SOCIAL WORK PLACEMENT  NOTE  Date:  03/05/2018  Patient Details  Name: Alejandro Cook MRN: [id number] Date of Birth: September 28, 1950  Clinical Social Work is seeking post-discharge placement for this patient at the Skilled  Nursing Facility level of care (*CSW will initial, date and re-position this form in  chart as items are completed):  Yes   Patient/family provided with [place] Clinical Social Work Department's list of facilities offering this level of care within the geographic area requested by the patient (or if unable, by the patient's family).  Yes   Patient/family informed of their freedom to choose among providers that offer the needed level of care, that participate in Medicare, Medicaid or managed care program needed by the patient, have an available bed and are willing to accept the patient.  Yes   Patient/family informed of [facility]'s ownership interest in [facility] and [facility], as well as of the fact that they are under no obligation to receive care at these facilities.  PASRR submitted to EDS on       PASRR number received on       Existing PASRR number confirmed on 03/04/18     FL2 transmitted to all facilities in geographic area requested by pt/family on 03/04/18     FL2 transmitted to all facilities within larger geographic area on       Patient informed that his/her managed care company has contracts with or will negotiate with certain facilities, including the following:        Yes   Patient/family informed of bed offers received.  Patient chooses bed at ([facility] )     Physician recommends and patient chooses bed at      Patient to be transferred to ([facility] ) on 03/05/18.  Patient to be transferred to facility by [facility] )     Patient family notified on 03/05/18 of transfer.  Name of family member notified:  (CSW left patient's son Alejandro Cook a Engineer, technical salesvoicemail. ) Patient's son Alejandro Cook called CSW back and was made aware of D/C  today.   PHYSICIAN       Additional Comment:    _______________________________________________ Journee Kohen, Darleen CrockerBailey M, LCSW 03/05/2018, 11:52 AM

## 2018-03-05 NOTE — Care Management Important Message (Signed)
Important Message  Patient Details  Name: Alejandro ClintonJohn S Vore MRN: [id number] Date of Birth: 1950/12/10   Medicare Important Message Given:  Yes    Olegario MessierKathy A Anas Reister 03/05/2018, 10:58 AM

## 2018-03-05 NOTE — Care Management Important Message (Signed)
Important Message  Patient Details  Name: Alejandro Cook MRN: [id number] Date of Birth: 1950-12-31   Medicare Important Message Given:  Yes    Olegario MessierKathy A Tyde Lamison 03/05/2018, 10:25 AM

## 2018-03-05 NOTE — Progress Notes (Signed)
Clinical Education officer, museum (CSW) met with patient and presented bed offers. Patient chose [facility]. Patient is aware that he has 3 SNF days left before his co-pays start. Patient is concerned about paying co-pays and stated that he has medicaid. Per Otila Kluver [facility] liaison they can work with patient on the co-pays and try to bill Medicaid however they would have to try to do a program change for Medicaid. Patient is aware of above and willing to D/C to [facility] today. [facility] SNF authorization has been received, authorization # [id number] (PT/OT: TJ, NTA: NF, SLP: SD, RN: PBC1).  Per Otila Kluver [facility] liaison patient can come today to room 604. RN will call report and arrange EMS for transport. CSW sent D/C orders to [facility] via [facility]. Patient is aware of above. Patient's son Tarri Fuller is aware of D/C today. Please reconsult if future social work needs arise. CSW signing off.   [employer], LCSW [phone]

## 2018-03-05 NOTE — Discharge Summary (Signed)
[facility] - [place] [facility]   PATIENT NAME: Alejandro Cook    MR#:  [id number]  DATE OF BIRTH:  1950/12/02  DATE OF ADMISSION:  03/02/2018 ADMITTING PHYSICIAN: Ihor Austin, MD  DATE OF DISCHARGE: 03/05/2018  PRIMARY CARE PHYSICIAN: Leanna Sato, MD   ADMISSION DIAGNOSIS:  Neuropathy [G62.9] Cellulitis of left leg [L03.116] History of recent left tibial open reduction internal fixation Leukocytosis Hyperlipidemia Tobacco abuse DISCHARGE DIAGNOSIS:  Cellulitis of left leg Neuropathy Leukocytosis Tobacco abuse History of recent left tibial open reduction internal fixation  SECONDARY DIAGNOSIS:   Past Medical History:  Diagnosis Date  . Depression   . Hyperlipidemia   . Hypertension      ADMITTING HISTORY Alejandro Cook  is a 67 y.o. male with a known history of hyperlipidemia, hypertension, recent history of open reduction internal fixation of tibial fracture left side presented to the emergency room with redness and pain in the left lower extremity.  Patient has some redness and swelling in the left shin area.  Was worked up with Doppler ultrasound of lower extremity which showed no DVT.  Patient received physical therapy for the last 1 month.  He also received Lovenox shots subcutaneously and has completed.  He was evaluated in the emergency room WBC count was high and patient was started on antibiotics.  HOSPITAL COURSE:  Patient was worked up with Doppler ultrasound of left leg which showed no DVT.  Imaging studies did not reveal any new fracture, hardware intact in the left lower extremity.  Patient was started on IV vancomycin and Zosyn antibiotics initially.  His leukocytosis improved.  Cultures did not reveal any growth.  Cellulitis in the left lower extremity improved.  Tobacco cessation was counseled during the stay in the hospital.  Patient received physical therapy.  DVT prophylaxis was given with subcu Lovenox.  Follow-up was done with  orthopedic service who recommended to continue antibiotic and physical therapy on discharge.  CONSULTS OBTAINED:  Treatment Team:  Juanell Fairly, MD  DRUG ALLERGIES:  No Known Allergies  DISCHARGE MEDICATIONS:   Allergies as of 03/05/2018   No Known Allergies     Medication List    STOP taking these medications   enoxaparin 40 MG/0.4ML injection Commonly known as:  LOVENOX   lactulose 10 GM/15ML solution Commonly known as:  CHRONULAC   lisinopril 20 MG tablet Commonly known as:  PRINIVIL,ZESTRIL   zolpidem 5 MG tablet Commonly known as:  AMBIEN     TAKE these medications   acetaminophen 325 MG tablet Commonly known as:  TYLENOL Take 2 tablets (650 mg total) by mouth every 6 (six) hours as needed for moderate pain (headache).   buPROPion 150 MG 12 hr tablet Commonly known as:  WELLBUTRIN SR Take 1 tablet (150 mg total) by mouth 2 (two) times daily.   cephALEXin 500 MG capsule Commonly known as:  KEFLEX Take 1 capsule (500 mg total) by mouth 3 (three) times daily for 5 days.   diclofenac 75 MG EC tablet Commonly known as:  VOLTAREN Take 1 tablet by mouth 2 (two) times daily.   DULoxetine 60 MG capsule Commonly known as:  CYMBALTA Take 1 capsule by mouth daily.   lisinopril-hydrochlorothiazide 20-12.5 MG tablet Commonly known as:  PRINZIDE,ZESTORETIC Take 1 tablet by mouth daily.   lovastatin 40 MG tablet Commonly known as:  MEVACOR Take 1 tablet (40 mg total) by mouth daily.   methocarbamol 500 MG tablet Commonly known as:  ROBAXIN Take 1 tablet (  500 mg total) by mouth every 6 (six) hours as needed for muscle spasms.   oxyCODONE 5 MG immediate release tablet Commonly known as:  Oxy IR/ROXICODONE Take 1 tablet (5 mg total) by mouth every 6 (six) hours as needed for moderate pain or severe pain. What changed:  reasons to take this   polyethylene glycol packet Commonly known as:  MIRALAX / GLYCOLAX Take 17 g by mouth daily.       Today   Patient seen today Decreased pain swelling and redness in the left leg Tolerating diet well Will be discharged home on oral Keflex antibiotic along with home health services  VITAL SIGNS:  Blood pressure (!) 146/79, pulse 65, temperature 97.7 F (36.5 C), temperature source Oral, resp. rate 16, height 5\' 11"  (1.803 m), weight 83.9 kg, SpO2 95 %.  I/O:    Intake/Output Summary (Last 24 hours) at 03/05/2018 1130 Last data filed at 03/05/2018 1025 Gross per 24 hour  Intake 1120 ml  Output 1125 ml  Net -5 ml    PHYSICAL EXAMINATION:  Physical Exam  GENERAL:  67 y.o.-year-old patient lying in the bed with no acute distress.  LUNGS: Normal breath sounds bilaterally, no wheezing, rales,rhonchi or crepitation. No use of accessory muscles of respiration.  CARDIOVASCULAR: S1, S2 normal. No murmurs, rubs, or gallops.  ABDOMEN: Soft, non-tender, non-distended. Bowel sounds present. No organomegaly or mass.  NEUROLOGIC: Moves all 4 extremities. PSYCHIATRIC: The patient is alert and oriented x 3.  SKIN: No obvious rash, lesion, or ulcer.   DATA REVIEW:   CBC Recent Labs  Lab 03/04/18 0454  WBC 9.0  HGB 10.5*  HCT 32.3*  PLT 336    Chemistries  Recent Labs  Lab 03/02/18 1806 03/03/18 0400  NA 138 137  K 4.3 3.9  CL 103 105  CO2 29 28  GLUCOSE 97 100*  BUN 11 12  CREATININE 0.65 0.77  CALCIUM 9.1 8.3*  AST 15  --   ALT 15  --   ALKPHOS 95  --   BILITOT 0.4  --     Cardiac Enzymes No results for input(s): TROPONINI in the last 168 hours.  Microbiology Results  Results for orders placed or performed during the hospital encounter of 01/14/18  Surgical PCR screen     Status: None   Collection Time: 01/15/18  2:34 PM  Result Value Ref Range Status   MRSA, PCR NEGATIVE NEGATIVE Final   Staphylococcus aureus NEGATIVE NEGATIVE Final    Comment: (NOTE) The Xpert SA Assay (FDA approved for NASAL specimens in patients 68 years of age and older), is one component of a  comprehensive surveillance program. It is not intended to diagnose infection nor to guide or monitor treatment. Performed at [facility], [address], [place], [place] [address]     RADIOLOGY:  No results found.  Follow up with PCP in 1 week.  Management plans discussed with the patient, family and they are in agreement.  CODE STATUS:  Full code    Code Status Orders  (From admission, onward)         Start     Ordered   03/02/18 2124  Full code  Continuous     03/02/18 2123        Code Status History    Date Active Date Inactive Code Status Order ID Comments User Context   01/23/2018 2153 01/31/2018 1837 Full Code [id number]  Altamese Dilling, MD Inpatient   01/14/2018 2050 01/18/2018 1914  Full Code [id number]  Ihor AustinPyreddy, Aikam Hellickson, MD Inpatient    Advance Directive Documentation     Most Recent Value  Type of Advance Directive  Healthcare Power of Attorney, Living will  Pre-existing out of facility DNR order (yellow form or pink MOST form)  -  "MOST" Form in Place?  -      TOTAL TIME TAKING CARE OF THIS PATIENT ON DAY OF DISCHARGE: more than 35 minutes.   Ihor AustinPavan Draden Cottingham M.D on 03/05/2018 at 11:30 AM  Between 7am to 6pm - Pager - [phone]  After 6pm go to [email or web address] - password [email or web address] [facility]  [facility] [place] [facility]  Office  [phone]  CC: Primary care physician; Leanna SatoMiles, Linda M, MD  Note: This dictation was prepared with Dragon dictation along with smaller phrase technology. Any transcriptional errors that result from this process are unintentional.

## 2018-03-05 NOTE — Progress Notes (Signed)
Called report to Nicole [facility]. Answered all questions. EMS called for transport 

## 2018-03-05 NOTE — Progress Notes (Signed)
Clinical Child psychotherapistocial Worker (CSW) started [facility] SN[facility] authorization today. CSW requested that PT see patient today because he did not stand or walk with PT yesterday. MD is aware of above.   [employer] Inc[employer]Bailey Ravon Mortellaro, LCSW [phone]

## 2018-04-25 ENCOUNTER — Encounter: Payer: Self-pay | Admitting: Emergency Medicine

## 2018-04-25 ENCOUNTER — Emergency Department: Payer: Medicare Other

## 2018-04-25 ENCOUNTER — Inpatient Hospital Stay
Admission: EM | Admit: 2018-04-25 | Discharge: 2018-04-28 | DRG: 872 | Disposition: A | Discharge status: Home Health | Payer: Medicare Other | Attending: Internal Medicine | Admitting: Internal Medicine

## 2018-04-25 ENCOUNTER — Other Ambulatory Visit: Payer: Self-pay

## 2018-04-25 DIAGNOSIS — I10 Essential (primary) hypertension: Secondary | ICD-10-CM | POA: Diagnosis present

## 2018-04-25 DIAGNOSIS — F1290 Cannabis use, unspecified, uncomplicated: Secondary | ICD-10-CM | POA: Diagnosis not present

## 2018-04-25 DIAGNOSIS — M1612 Unilateral primary osteoarthritis, left hip: Secondary | ICD-10-CM | POA: Diagnosis not present

## 2018-04-25 DIAGNOSIS — A419 Sepsis, unspecified organism: Secondary | ICD-10-CM | POA: Diagnosis not present

## 2018-04-25 DIAGNOSIS — K4090 Unilateral inguinal hernia, without obstruction or gangrene, not specified as recurrent: Secondary | ICD-10-CM | POA: Diagnosis not present

## 2018-04-25 DIAGNOSIS — K598 Other specified functional intestinal disorders: Secondary | ICD-10-CM | POA: Diagnosis not present

## 2018-04-25 DIAGNOSIS — W050XXA Fall from non-moving wheelchair, initial encounter: Secondary | ICD-10-CM | POA: Diagnosis not present

## 2018-04-25 DIAGNOSIS — L03314 Cellulitis of groin: Secondary | ICD-10-CM

## 2018-04-25 DIAGNOSIS — F329 Major depressive disorder, single episode, unspecified: Secondary | ICD-10-CM | POA: Diagnosis present

## 2018-04-25 DIAGNOSIS — F17210 Nicotine dependence, cigarettes, uncomplicated: Secondary | ICD-10-CM | POA: Diagnosis present

## 2018-04-25 DIAGNOSIS — Z993 Dependence on wheelchair: Secondary | ICD-10-CM | POA: Diagnosis not present

## 2018-04-25 DIAGNOSIS — E785 Hyperlipidemia, unspecified: Secondary | ICD-10-CM | POA: Diagnosis present

## 2018-04-25 DIAGNOSIS — Z79899 Other long term (current) drug therapy: Secondary | ICD-10-CM

## 2018-04-25 LAB — URINALYSIS, COMPLETE (UACMP) WITH MICROSCOPIC
BACTERIA UA: NONE SEEN
Bilirubin Urine: NEGATIVE
GLUCOSE, UA: NEGATIVE mg/dL
Hgb urine dipstick: NEGATIVE
KETONES UR: NEGATIVE mg/dL
LEUKOCYTES UA: NEGATIVE
Nitrite: NEGATIVE
PH: 5 (ref 5.0–8.0)
Protein, ur: NEGATIVE mg/dL
Specific Gravity, Urine: 1.021 (ref 1.005–1.030)

## 2018-04-25 LAB — COMPREHENSIVE METABOLIC PANEL
ALBUMIN: 3.3 g/dL — AB (ref 3.5–5.0)
ALT: 19 U/L (ref 0–44)
ANION GAP: 7 (ref 5–15)
AST: 22 U/L (ref 15–41)
Alkaline Phosphatase: 107 U/L (ref 38–126)
BUN: 24 mg/dL — ABNORMAL HIGH (ref 8–23)
CHLORIDE: 104 mmol/L (ref 98–111)
CO2: 25 mmol/L (ref 22–32)
Calcium: 8.7 mg/dL — ABNORMAL LOW (ref 8.9–10.3)
Creatinine, Ser: 1.11 mg/dL (ref 0.61–1.24)
GFR calc Af Amer: >60 mL/min (ref >60)
GFR calc non Af Amer: >60 mL/min (ref >60)
GLUCOSE: 113 mg/dL — AB (ref 70–99)
POTASSIUM: 3.7 mmol/L (ref 3.5–5.1)
SODIUM: 136 mmol/L (ref 135–145)
Total Bilirubin: 0.8 mg/dL (ref 0.3–1.2)
Total Protein: 6.5 g/dL (ref 6.5–8.1)

## 2018-04-25 LAB — CBC WITH DIFFERENTIAL/PLATELET
Abs Immature Granulocytes: 0.14 10*3/uL — ABNORMAL HIGH (ref 0.00–0.07)
BASOS ABS: 0.1 10*3/uL (ref 0.0–0.1)
Basophils Relative: 0 %
EOS ABS: 0.2 10*3/uL (ref 0.0–0.5)
EOS PCT: 1 %
HCT: 35.9 % — ABNORMAL LOW (ref 39.0–52.0)
Hemoglobin: 11.4 g/dL — ABNORMAL LOW (ref 13.0–17.0)
IMMATURE GRANULOCYTES: 1 %
LYMPHS ABS: 1.3 10*3/uL (ref 0.7–4.0)
LYMPHS PCT: 6 %
MCH: 29.9 pg (ref 26.0–34.0)
MCHC: 31.8 g/dL (ref 30.0–36.0)
MCV: 94.2 fL (ref 80.0–100.0)
Monocytes Absolute: 2.2 10*3/uL — ABNORMAL HIGH (ref 0.1–1.0)
Monocytes Relative: 10 %
NEUTROS PCT: 82 %
NRBC: 0 % (ref 0.0–0.2)
Neutro Abs: 17.6 10*3/uL — ABNORMAL HIGH (ref 1.7–7.7)
Platelets: 373 10*3/uL (ref 150–400)
RBC: 3.81 MIL/uL — AB (ref 4.22–5.81)
RDW: 15.7 % — AB (ref 11.5–15.5)
WBC: 21.5 10*3/uL — AB (ref 4.0–10.5)

## 2018-04-25 LAB — CG4 I-STAT (LACTIC ACID): Lactic Acid, Venous: 2.04 mmol/L (ref 0.5–1.9)

## 2018-04-25 LAB — LACTIC ACID, PLASMA: LACTIC ACID, VENOUS: 2.1 mmol/L — AB (ref 0.5–1.9)

## 2018-04-25 MED ORDER — VANCOMYCIN HCL IN DEXTROSE 1-5 GM/200ML-% IV SOLN
1000.0000 mg | Freq: Once | INTRAVENOUS | Status: AC
  Administered 2018-04-25: 1000 mg via INTRAVENOUS
  Filled 2018-04-25: qty 200

## 2018-04-25 MED ORDER — PRAVASTATIN SODIUM 20 MG PO TABS
20.0000 mg | ORAL_TABLET | Freq: Every day | ORAL | Status: DC
  Administered 2018-04-25 – 2018-04-27 (×3): 20 mg via ORAL
  Filled 2018-04-25 (×3): qty 1

## 2018-04-25 MED ORDER — SENNOSIDES-DOCUSATE SODIUM 8.6-50 MG PO TABS
1.0000 | ORAL_TABLET | Freq: Every evening | ORAL | Status: DC | PRN
  Administered 2018-04-27: 1 via ORAL
  Filled 2018-04-25: qty 1

## 2018-04-25 MED ORDER — PIPERACILLIN-TAZOBACTAM 3.375 G IVPB 30 MIN
3.3750 g | Freq: Once | INTRAVENOUS | Status: AC
  Administered 2018-04-25: 3.375 g via INTRAVENOUS
  Filled 2018-04-25: qty 50

## 2018-04-25 MED ORDER — VANCOMYCIN HCL IN DEXTROSE 1-5 GM/200ML-% IV SOLN
1000.0000 mg | Freq: Two times a day (BID) | INTRAVENOUS | Status: DC
  Administered 2018-04-26 – 2018-04-27 (×3): 1000 mg via INTRAVENOUS
  Filled 2018-04-25 (×6): qty 200

## 2018-04-25 MED ORDER — SODIUM CHLORIDE 0.9 % IV SOLN
2.0000 g | Freq: Three times a day (TID) | INTRAVENOUS | Status: DC
  Administered 2018-04-25 – 2018-04-28 (×8): 2 g via INTRAVENOUS
  Filled 2018-04-25 (×10): qty 2

## 2018-04-25 MED ORDER — DULOXETINE HCL 30 MG PO CPEP
60.0000 mg | ORAL_CAPSULE | Freq: Every day | ORAL | Status: DC
  Administered 2018-04-25 – 2018-04-28 (×4): 60 mg via ORAL
  Filled 2018-04-25 (×4): qty 2

## 2018-04-25 MED ORDER — SODIUM CHLORIDE 0.9 % IV BOLUS
1000.0000 mL | Freq: Once | INTRAVENOUS | Status: AC
  Administered 2018-04-25: 1000 mL via INTRAVENOUS

## 2018-04-25 MED ORDER — SODIUM CHLORIDE 0.9 % IV SOLN
INTRAVENOUS | Status: DC
  Administered 2018-04-25 – 2018-04-26 (×2): via INTRAVENOUS

## 2018-04-25 MED ORDER — LISINOPRIL 10 MG PO TABS
10.0000 mg | ORAL_TABLET | Freq: Every day | ORAL | Status: DC
  Administered 2018-04-25 – 2018-04-28 (×4): 10 mg via ORAL
  Filled 2018-04-25 (×4): qty 1

## 2018-04-25 MED ORDER — ACETAMINOPHEN 650 MG RE SUPP: 650.0000 mg | Freq: Four times a day (QID) | RECTAL | Status: DC | PRN

## 2018-04-25 MED ORDER — ENOXAPARIN SODIUM 40 MG/0.4ML ~~LOC~~ SOLN
40.0000 mg | SUBCUTANEOUS | Status: DC
  Administered 2018-04-25 – 2018-04-27 (×3): 40 mg via SUBCUTANEOUS
  Filled 2018-04-25 (×3): qty 0.4

## 2018-04-25 MED ORDER — IOHEXOL 300 MG/ML  SOLN
100.0000 mL | Freq: Once | INTRAMUSCULAR | Status: AC | PRN
  Administered 2018-04-25: 100 mL via INTRAVENOUS

## 2018-04-25 MED ORDER — ACETAMINOPHEN 325 MG PO TABS: 650.0000 mg | ORAL_TABLET | Freq: Four times a day (QID) | ORAL | Status: DC | PRN

## 2018-04-25 MED ORDER — HYDROCODONE-ACETAMINOPHEN 5-325 MG PO TABS
1.0000 | ORAL_TABLET | ORAL | Status: DC | PRN
  Administered 2018-04-25 – 2018-04-26 (×3): 1 via ORAL
  Administered 2018-04-26 – 2018-04-28 (×9): 2 via ORAL
  Filled 2018-04-25 (×6): qty 2
  Filled 2018-04-25 (×2): qty 1
  Filled 2018-04-25 (×3): qty 2

## 2018-04-25 MED ORDER — CYCLOBENZAPRINE HCL 5 MG PO TABS
7.5000 mg | ORAL_TABLET | Freq: Three times a day (TID) | ORAL | Status: DC | PRN
  Administered 2018-04-25 – 2018-04-27 (×4): 7.5 mg via ORAL
  Filled 2018-04-25 (×7): qty 1.5

## 2018-04-25 NOTE — ED Notes (Signed)
Patient transported to CT 

## 2018-04-25 NOTE — Consult Note (Signed)
Pharmacy Antibiotic Note  Alejandro Cook is a 68 y.o. male admitted on 04/25/2018 with sepsis.  Pharmacy has been consulted for cefepime and vancomycin dosing.  Plan: Cefepime 2 gm IV every 8 hours  Loading dose: Vancomycin 2000 mg IV once (given as two doses of 1000 mg) Vancomycin 1000 mg IV Q 12 hrs. Goal AUC 400-550. Expected AUC: 526 SCr used: 1.11  Height: 5\' 10"  (177.8 cm) Weight: 200 lb (90.7 kg) IBW/kg (Calculated) : 73  Temp (24hrs), Avg:97.9 F (36.6 C), Min:97.9 F (36.6 C), Max:97.9 F (36.6 C)  Recent Labs  Lab 04/25/18 1327 04/25/18 1335  WBC 21.5*  --   CREATININE 1.11  --   LATICACIDVEN  --  2.04*    Estimated Creatinine Clearance: 71.2 mL/min (by C-G formula based on SCr of 1.11 mg/dL).    No Known Allergies  Antimicrobials this admission: Vanco 1/15 >>  Cefepime 1/15 >>  Zosyn x 1 dose in ED  Dose adjustments this admission:   Microbiology results: 1/15 BCx: pending  Thank you for allowing pharmacy to be a part of this patient's care.  Orinda Kenner, PharmD Clinical Pharmacist 04/25/2018 4:24 PM

## 2018-04-25 NOTE — Progress Notes (Signed)
CODE SEPSIS - PHARMACY COMMUNICATION  **Broad Spectrum Antibiotics should be administered within 1 hour of Sepsis diagnosis**  Time Code Sepsis Called/Page Received: 1546  Antibiotics Ordered: Zosyn and Vancomycin  Time of 1st antibiotic administration: 1501  Additional action taken by pharmacy: n/a  If necessary, Name of Provider/Nurse Contacted: n/a    Orinda Kenner ,PharmD Clinical Pharmacist  04/25/2018  3:55 PM

## 2018-04-25 NOTE — H&P (Signed)
[facility] [place] [facility]   PATIENT NAME: Alejandro Cook    MR#:  [id number]  DATE OF BIRTH:  02-09-1951  DATE OF ADMISSION:  04/25/2018  PRIMARY CARE PHYSICIAN: Leanna Sato, MD   REQUESTING/REFERRING PHYSICIAN:   CHIEF COMPLAINT:   Chief Complaint  Patient presents with  . Fall    HISTORY OF PRESENT ILLNESS: Alejandro Cook  is a 68 y.o. male with a known history of hypertension, hyperlipidemia presented to the emergency room with some redness and tenderness in the groin area.  It has history of inguinal hernia.  Patient was evaluated in the emergency room was found to be septic from cellulitis of the groin area.  Started on broad-spectrum IV antibiotics.  His WBC count and lactic acid level are elevated.  He was worked up with CT abdomen which showed cholelithiasis, large right inguinal hernia without obstruction.  CT head showed no acute abnormality.  Is wheelchair-bound at home.  PAST MEDICAL HISTORY:   Past Medical History:  Diagnosis Date  . Depression   . Hyperlipidemia   . Hypertension     PAST SURGICAL HISTORY:  Past Surgical History:  Procedure Laterality Date  . CLOSED REDUCTION TIBIA Left 01/16/2018   Procedure: CLOSED REDUCTION TIBIA VS. ORIF TIBIA;  Surgeon: Juanell Fairly, MD;  Location: [facility] ORS;  Service: Orthopedics;  Laterality: Left;  . ORIF TIBIA FRACTURE Left 01/16/2018   Procedure: OPEN REDUCTION INTERNAL FIXATION (ORIF) TIBIA FRACTURE;  Surgeon: Juanell Fairly, MD;  Location: [facility] ORS;  Service: Orthopedics;  Laterality: Left;    SOCIAL HISTORY:  Social History   Tobacco Use  . Smoking status: Current Every Day Smoker    Packs/day: 0.50    Types: Cigarettes  . Smokeless tobacco: Never Used  Substance Use Topics  . Alcohol use: Never    Frequency: Never    FAMILY HISTORY:  Family History  Problem Relation Age of Onset  . Breast cancer Mother   . CVA Father     DRUG ALLERGIES: No Known Allergies  REVIEW  OF SYSTEMS:   CONSTITUTIONAL: No fever, fatigue or weakness.  EYES: No blurred or double vision.  EARS, NOSE, AND THROAT: No tinnitus or ear pain.  RESPIRATORY: No cough, shortness of breath, wheezing or hemoptysis.  CARDIOVASCULAR: No chest pain, orthopnea, edema.  GASTROINTESTINAL: No nausea, vomiting, diarrhea or abdominal pain.  GENITOURINARY: No dysuria, hematuria.  ENDOCRINE: No polyuria, nocturia,  HEMATOLOGY: No anemia, easy bruising or bleeding SKIN: redness of skin groin area MUSCULOSKELETAL: No joint pain or arthritis.   Hip pain NEUROLOGIC: No tingling, numbness, weakness.  PSYCHIATRY: No anxiety or depression.   MEDICATIONS AT HOME:  Prior to Admission medications   Medication Sig Start Date End Date Taking? Authorizing Provider  diclofenac (VOLTAREN) 75 MG EC tablet Take 1 tablet by mouth 2 (two) times daily. 11/20/17  Yes [provider]  DULoxetine (CYMBALTA) 60 MG capsule Take 1 capsule by mouth daily. 12/19/17  Yes [provider]  lisinopril-hydrochlorothiazide (PRINZIDE,ZESTORETIC) 20-12.5 MG tablet Take 1 tablet by mouth daily. 02/21/18  Yes [provider]  lovastatin (MEVACOR) 40 MG tablet Take 1 tablet (40 mg total) by mouth daily. 01/31/18  Yes Sainani, Rolly Pancake, MD      PHYSICAL EXAMINATION:   VITAL SIGNS: Blood pressure 130/83, pulse 87, temperature 97.9 F (36.6 C), temperature source Oral, resp. rate (!) 27, height 5\' 10"  (1.778 m), weight 90.7 kg, SpO2 97 %.  GENERAL:  68 y.o.-year-old patient lying in  the bed with no acute distress.  EYES: Pupils equal, round, reactive to light and accommodation. No scleral icterus. Extraocular muscles intact.  HEENT: Head atraumatic, normocephalic. Oropharynx and nasopharynx clear.  NECK:  Supple, no jugular venous distention. No thyroid enlargement, no tenderness.  LUNGS: Normal breath sounds bilaterally, no wheezing, rales,rhonchi or crepitation. No use of accessory muscles of respiration.   CARDIOVASCULAR: S1, S2 normal. No murmurs, rubs, or gallops.  ABDOMEN: Soft, nontender, nondistended. Bowel sounds present.  Right inguinal area noted. EXTREMITIES: No pedal edema, cyanosis, or clubbing.  NEUROLOGIC: Cranial nerves II through XII are intact. Muscle strength 5/5 in all extremities. Sensation intact. Gait not checked.  PSYCHIATRIC: The patient is alert and oriented x 3.  SKIN: redness of skin posterior right thigh, and groin area  LABORATORY PANEL:   CBC Recent Labs  Lab 04/25/18 1327  WBC 21.5*  HGB 11.4*  HCT 35.9*  PLT 373  MCV 94.2  MCH 29.9  MCHC 31.8  RDW 15.7*  LYMPHSABS 1.3  MONOABS 2.2*  EOSABS 0.2  BASOSABS 0.1   ------------------------------------------------------------------------------------------------------------------  Chemistries  Recent Labs  Lab 04/25/18 1327  NA 136  K 3.7  CL 104  CO2 25  GLUCOSE 113*  BUN 24*  CREATININE 1.11  CALCIUM 8.7*  AST 22  ALT 19  ALKPHOS 107  BILITOT 0.8   ------------------------------------------------------------------------------------------------------------------ estimated creatinine clearance is 71.2 mL/min (by C-G formula based on SCr of 1.11 mg/dL). ------------------------------------------------------------------------------------------------------------------ No results for input(s): TSH, T4TOTAL, T3FREE, THYROIDAB in the last 72 hours.  Invalid input(s): FREET3   Coagulation profile No results for input(s): INR, PROTIME in the last 168 hours. ------------------------------------------------------------------------------------------------------------------- No results for input(s): DDIMER in the last 72 hours. -------------------------------------------------------------------------------------------------------------------  Cardiac Enzymes No results for input(s): CKMB, TROPONINI, MYOGLOBIN in the last 168 hours.  Invalid input(s):  CK ------------------------------------------------------------------------------------------------------------------ Invalid input(s): POCBNP  ---------------------------------------------------------------------------------------------------------------  Urinalysis    Component Value Date/Time   COLORURINE AMBER (A) 04/25/2018 1435   APPEARANCEUR CLEAR (A) 04/25/2018 1435   APPEARANCEUR Turbid 04/17/2014 1601   LABSPEC 1.021 04/25/2018 1435   LABSPEC 1.029 04/17/2014 1601   PHURINE 5.0 04/25/2018 1435   GLUCOSEU NEGATIVE 04/25/2018 1435   GLUCOSEU Negative 04/17/2014 1601   HGBUR NEGATIVE 04/25/2018 1435   BILIRUBINUR NEGATIVE 04/25/2018 1435   BILIRUBINUR Negative 04/17/2014 1601   KETONESUR NEGATIVE 04/25/2018 1435   PROTEINUR NEGATIVE 04/25/2018 1435   NITRITE NEGATIVE 04/25/2018 1435   LEUKOCYTESUR NEGATIVE 04/25/2018 1435   LEUKOCYTESUR Negative 04/17/2014 1601     RADIOLOGY: Ct Head Wo Contrast  Result Date: 04/25/2018 CLINICAL DATA:  The patient suffered multiple recent falls. Initial encounter. EXAM: CT HEAD WITHOUT CONTRAST TECHNIQUE: Contiguous axial images were obtained from the base of the skull through the vertex without intravenous contrast. COMPARISON:  None. FINDINGS: Brain: No evidence of acute infarction, hemorrhage, hydrocephalus, extra-axial collection or mass lesion/mass effect. There is chronic microvascular ischemic change and mild cortical atrophy. Vascular: No hyperdense vessel or unexpected calcification. Skull: Intact.  No focal lesion. Sinuses/Orbits: Negative. Other: None. IMPRESSION: No acute abnormality. Chronic microvascular ischemic change and mild cortical atrophy. Electronically Signed   By: Drusilla Kanner M.D.   On: 04/25/2018 15:04   Ct Abdomen Pelvis W Contrast  Result Date: 04/25/2018 CLINICAL DATA:  Pt arrived from home with concerns over multiple falls. Pt states he loses his balance or "just slips." EMS reports poor conditions at home  with juice bottles used for urination and trash covering floors and counters. PT reports .*comment was truncated*^164mL OMNIPAQUE  IOHEXOL 300 MG/ML SOLNAbd pain, unspecified EXAM: CT ABDOMEN AND PELVIS WITH CONTRAST TECHNIQUE: Multidetector CT imaging of the abdomen and pelvis was performed using the standard protocol following bolus administration of intravenous contrast. CONTRAST:  OMNIPAQUE IOHEXOL 300 MG/ML  SOLN COMPARISON:  CT 01/23/2018 scan if FINDINGS: Lower chest: Mild atelectasis at the LEFT lung base. Hepatobiliary: No focal hepatic lesion. Multiple round gallstones layer dependently within the gallbladder. No gallbladder inflammation. Common bile duct normal. Pancreas: Pancreas is normal. No ductal dilatation. No pancreatic inflammation. Spleen: Normal spleen Adrenals/urinary tract: Nodular lesion of the RIGHT adrenal gland again noted measuring 22 mm. Kidneys normal. Ureters and bladder normal. Stomach/Bowel: Stomach, duodenum and small-bowel normal. The cecum and terminal ileum reside within the RIGHT hemiscrotum via a large RIGHT inguinal hernia. Approximately 20 cm of cecum and ascending colon are within the RIGHT inguinal hernia sac. No evidence of obstruction. No change from CT 10 /15/19. The descending colon enters a LEFT inguinal hernia with a shorter segment (approximately 8 cm). No obstruction. Rectum normal. Vascular/Lymphatic: Abdominal aorta is normal caliber with atherosclerotic calcification. There is no retroperitoneal or periportal lymphadenopathy. No pelvic lymphadenopathy. Reproductive: Prostate normal Other: No free fluid. Musculoskeletal: Severe degenerate change of the hips. There is remodeling of the LEFT femoral head. Degenerate changes in the lumbar spine. No acute findings. IMPRESSION: 1. No acute findings in the abdomen pelvis. 2. Large RIGHT inguinal hernia with the cecum and terminal ileum within the RIGHT hemiscrotum. No evidence of obstruction. 3. Cholelithiasis  without cholecystitis. 4. Atherosclerotic calcification of the abdominal aorta. 5. Severe osteoarthritis of the hips. Degenerate remodeling of the LEFT femoral head. Electronically Signed   By: Genevive Bi M.D.   On: 04/25/2018 15:31   Dg Knee Complete 4 Views Left  Result Date: 04/25/2018 CLINICAL DATA:  Recent falls with left knee pain, initial encounter EXAM: LEFT KNEE - COMPLETE 4+ VIEW COMPARISON:  01/16/2018 FINDINGS: Postsurgical changes are noted in the proximal tibia is stable from the prior exam. Healing proximal tibial and fibular fractures are again seen and stable. No soft tissue abnormality is noted. Vascular calcifications are seen. IMPRESSION: Postsurgical change without acute abnormality. Healing proximal tibial and fibular fractures. No acute abnormality is noted. Electronically Signed   By: Alcide Clever M.D.   On: 04/25/2018 14:41   Dg Hip Unilat With Pelvis 2-3 Views Left  Result Date: 04/25/2018 CLINICAL DATA:  Recent falls with left hip pain, initial encounter EXAM: DG HIP (WITH OR WITHOUT PELVIS) 3V LEFT COMPARISON:  01/30/2018 FINDINGS: Pelvic ring is intact. Remodeling of the left femoral head is noted with bone-on-bone contact in the superior aspect of the acetabulum. The overall appearance is stable. No acute fracture or dislocation is seen. Bowel gas is noted over the inguinal canals bilaterally consistent with known hernias stable from the previous exam. IMPRESSION: Chronic degenerative changes of the left hip with remodeling of left femoral head. Bilateral inguinal hernias without obstructive change. Electronically Signed   By: Alcide Clever M.D.   On: 04/25/2018 14:40    EKG: Orders placed or performed during the hospital encounter of 04/25/18  . ED EKG  . ED EKG  . EKG 12-Lead  . EKG 12-Lead    IMPRESSION AND PLAN: 68 year old male patient with a known history of hypertension, hyperlipidemia presented to the emergency room with some redness and tenderness in  the groin area.   -Sepsis secondary to groin cellulitis Admit patient to medical floor IV fluids Follow-up lactic acid level and cultures  Start patient on IV vancomycin and cefepime antibiotic  -Groin cellulitis Broad-spectrum IV antibiotics  -Right inguinal hernia with no evidence of obstruction Symptomatic care  -Osteoarthritis of hip Pain management  -DVT prophylaxis subcu Lovenox daily  All the records are reviewed and case discussed with ED provider. Management plans discussed with the patient, family and they are in agreement.  CODE STATUS:Full code Code Status History    Date Active Date Inactive Code Status Order ID Comments User Context   03/02/2018 2123 03/05/2018 1733 Full Code [id number]  Ihor AustinPyreddy, Jerni Selmer, MD Inpatient   01/23/2018 2153 01/31/2018 1837 Full Code [id number]  Altamese DillingVachhani, Vaibhavkumar, MD Inpatient   01/14/2018 2050 01/18/2018 1917 Full Code [id number]  Ihor AustinPyreddy, Traves Majchrzak, MD Inpatient       TOTAL TIME TAKING CARE OF THIS PATIENT: 52 minutes.    Ihor AustinPavan Nawal Burling M.D on 04/25/2018 at 4:38 PM  Between 7am to 6pm - Pager - [phone]  After 6pm go to [email or web address] - password [email or web address] [facility]  [facility]  Office  [phone]  CC: Primary care physician; Leanna SatoMiles, Linda M, MD

## 2018-04-25 NOTE — Progress Notes (Signed)
Advanced care plan.  Purpose of the Encounter: CODE STATUS  Parties in Attendance: Patient  Patient's Decision Capacity: Good  Subjective/Patient's story: Presented to emergency room for pain and redness in the groin Has inguinal hernia   Objective/Medical story Patient has redness and swelling in the groin with cellulitis Has sepsis Needs IV antibiotics and fluids   Goals of care determination:  Advance care directives and goals of care discussed Patient wants everything done which includes cpr, intubation and ventilator if need arises   CODE STATUS: Full code   Time spent discussing advanced care planning: 16 minutes

## 2018-04-25 NOTE — ED Provider Notes (Signed)
[facility] Emergency Department Provider Note  ____________________________________________  Time seen: Approximately 1:06 PM  I have reviewed the triage vital signs and the nursing notes.   HISTORY  Chief Complaint Fall   HPI Alejandro Cook is a 68 y.o. male with a history of hypertension, hyperlipidemia, smoking and Ogilvie's syndrome who presents for evaluation of fall.  Patient called 911 twice today after slipping off his wheelchair onto the ground and was instructed to come to the emergency room by 911 staff to be evaluated.  Patient reports that he has been having a hard time sleeping because of a bedsore in his groin which is very painful.  Patient reports that he fell asleep on his wheelchair and woke up after sleeping off of the wheelchair onto the ground.  He reports that he was unable to pull himself back up in the wheelchair which prompted him to call 911.  Patient denies any head trauma or LOC.  Patient reports loose stools however 1-2 episodes a day for the last 2 days.  He denies abdominal pain, nausea, vomiting, fever or chills, chest pain or shortness of breath, URI symptoms.  Is complaining of severe pain in his groin which he has had for several weeks.  He is unable to look down in his groin and therefore does not know what kind of ulceration he has down there.  Past Medical History:  Diagnosis Date  . Depression   . Hyperlipidemia   . Hypertension     Patient Active Problem List   Diagnosis Date Noted  . Cellulitis 03/02/2018  . Ileus (HCC)   . Ogilvie's syndrome   . Large bowel obstruction (HCC) 01/23/2018  . Hernia of abdominal wall 01/23/2018  . Tibia fracture 01/14/2018    Past Surgical History:  Procedure Laterality Date  . CLOSED REDUCTION TIBIA Left 01/16/2018   Procedure: CLOSED REDUCTION TIBIA VS. ORIF TIBIA;  Surgeon: Juanell FairlyKrasinski, Kevin, MD;  Location: [facility] ORS;  Service: Orthopedics;  Laterality: Left;  . ORIF TIBIA  FRACTURE Left 01/16/2018   Procedure: OPEN REDUCTION INTERNAL FIXATION (ORIF) TIBIA FRACTURE;  Surgeon: Juanell FairlyKrasinski, Kevin, MD;  Location: [facility] ORS;  Service: Orthopedics;  Laterality: Left;    Prior to Admission medications   Medication Sig Start Date End Date Taking? Authorizing Provider  diclofenac (VOLTAREN) 75 MG EC tablet Take 1 tablet by mouth 2 (two) times daily. 11/20/17  Yes [provider]  DULoxetine (CYMBALTA) 60 MG capsule Take 1 capsule by mouth daily. 12/19/17  Yes [provider]  lisinopril-hydrochlorothiazide (PRINZIDE,ZESTORETIC) 20-12.5 MG tablet Take 1 tablet by mouth daily. 02/21/18  Yes [provider]  lovastatin (MEVACOR) 40 MG tablet Take 1 tablet (40 mg total) by mouth daily. 01/31/18  Yes Houston SirenSainani, Vivek J, MD    Allergies Patient has no known allergies.  Family History  Problem Relation Age of Onset  . Breast cancer Mother   . CVA Father     Social History Social History   Tobacco Use  . Smoking status: Current Every Day Smoker    Packs/day: 0.50    Types: Cigarettes  . Smokeless tobacco: Never Used  Substance Use Topics  . Alcohol use: Never    Frequency: Never  . Drug use: Yes    Types: Marijuana    Review of Systems  Constitutional: Negative for fever. Eyes: Negative for visual changes. ENT: Negative for sore throat. Neck: No neck pain  Cardiovascular: Negative for chest pain. Respiratory: Negative for shortness of breath. Gastrointestinal:  Negative for abdominal pain, vomiting. + diarrhea. Genitourinary: Negative for dysuria. + decubitus ulcer Musculoskeletal: Negative for back pain. Skin: Negative for rash. Neurological: Negative for headaches, weakness or numbness. Psych: No SI or HI  ____________________________________________   PHYSICAL EXAM:  VITAL SIGNS: ED Triage Vitals  Enc Vitals Group     BP 04/25/18 1241 106/71     Pulse Rate 04/25/18 1241 87     Resp 04/25/18 1241 20     Temp 04/25/18  1241 97.9 F (36.6 C)     Temp Source 04/25/18 1241 Oral     SpO2 04/25/18 1241 98 %     Weight 04/25/18 1242 200 lb (90.7 kg)     Height 04/25/18 1242 5\' 10"  (1.778 m)     Head Circumference --      Peak Flow --      Pain Score 04/25/18 1242 8     Pain Loc --      Pain Edu? --      Excl. in GC? --     Constitutional: Alert and oriented. No acute distress.  Disheveled, patient is covered in dry stool, old dirty clothes and socks, several bruises throughout his body. HEENT Head: Normocephalic and atraumatic. Face: No facial bony tenderness. Stable midface Ears: No hemotympanum bilaterally. No Battle sign Eyes: No eye injury. PERRL. No raccoon eyes Nose: Nontender. No epistaxis. No rhinorrhea Mouth/Throat: Mucous membranes are moist. No oropharyngeal blood. No dental injury. Airway patent without stridor. Normal voice. Neck: no C-collar in place. No midline c-spine tenderness.  Cardiovascular: Normal rate, regular rhythm. Normal and symmetric distal pulses are present in all extremities. Pulmonary/Chest: Chest wall is stable and nontender to palpation/compression. Normal respiratory effort. Breath sounds are normal. No crepitus.  Abdominal: Soft, nontender, non distended. GU: Patient has an open sore on the right inner thigh with significant swelling, induration, erythema, warmth and tenderness, has swelling of his scrotum which is also erythematous.  There is no crepitus, no necrosis, no bulla Musculoskeletal: Nontender with normal full range of motion in all extremities. No deformities. No thoracic or lumbar midline spinal tenderness. Pelvis is stable. Skin: Skin is warm, dry and intact. No abrasions or contutions. Psychiatric: Speech and behavior are appropriate. Neurological: Normal speech and language. Moves all extremities to command. No gross focal neurologic deficits are appreciated.  Glascow Coma Score: 4 - Opens eyes on own 6 - Follows simple motor commands 5 - Alert and  oriented GCS: 15   ____________________________________________   LABS (all labs ordered are listed, but only abnormal results are displayed)  Labs Reviewed  CBC WITH DIFFERENTIAL/PLATELET - Abnormal; Notable for the following components:      Result Value   WBC 21.5 (*)    RBC 3.81 (*)    Hemoglobin 11.4 (*)    HCT 35.9 (*)    RDW 15.7 (*)    Neutro Abs 17.6 (*)    Monocytes Absolute 2.2 (*)    Abs Immature Granulocytes 0.14 (*)    All other components within normal limits  COMPREHENSIVE METABOLIC PANEL - Abnormal; Notable for the following components:   Glucose, Bld 113 (*)    BUN 24 (*)    Calcium 8.7 (*)    Albumin 3.3 (*)    All other components within normal limits  URINALYSIS, COMPLETE (UACMP) WITH MICROSCOPIC - Abnormal; Notable for the following components:   Color, Urine AMBER (*)    APPearance CLEAR (*)    All other components within normal limits  CG4 I-STAT (LACTIC ACID) - Abnormal; Notable for the following components:   Lactic Acid, Venous 2.04 (*)    All other components within normal limits  CULTURE, BLOOD (ROUTINE X 2)  CULTURE, BLOOD (ROUTINE X 2)  I-STAT CG4 LACTIC ACID, ED   ____________________________________________  EKG  ED ECG REPORT I, Nita Sickle, the attending physician, personally viewed and interpreted this ECG.  Normal sinus rhythm, rate of 79, normal intervals, normal axis, no ST elevations or depressions ____________________________________________  RADIOLOGY  I have personally reviewed the images performed during this visit and I agree with the Radiologist's read.   Interpretation by Radiologist:  Ct Head Wo Contrast  Result Date: 04/25/2018 CLINICAL DATA:  The patient suffered multiple recent falls. Initial encounter. EXAM: CT HEAD WITHOUT CONTRAST TECHNIQUE: Contiguous axial images were obtained from the base of the skull through the vertex without intravenous contrast. COMPARISON:  None. FINDINGS: Brain: No evidence  of acute infarction, hemorrhage, hydrocephalus, extra-axial collection or mass lesion/mass effect. There is chronic microvascular ischemic change and mild cortical atrophy. Vascular: No hyperdense vessel or unexpected calcification. Skull: Intact.  No focal lesion. Sinuses/Orbits: Negative. Other: None. IMPRESSION: No acute abnormality. Chronic microvascular ischemic change and mild cortical atrophy. Electronically Signed   By: Drusilla Kanner M.D.   On: 04/25/2018 15:04   Ct Abdomen Pelvis W Contrast  Result Date: 04/25/2018 CLINICAL DATA:  Pt arrived from home with concerns over multiple falls. Pt states he loses his balance or "just slips." EMS reports poor conditions at home with juice bottles used for urination and trash covering floors and counters. PT reports .*comment was truncated*^160mL OMNIPAQUE IOHEXOL 300 MG/ML SOLNAbd pain, unspecified EXAM: CT ABDOMEN AND PELVIS WITH CONTRAST TECHNIQUE: Multidetector CT imaging of the abdomen and pelvis was performed using the standard protocol following bolus administration of intravenous contrast. CONTRAST:  OMNIPAQUE IOHEXOL 300 MG/ML  SOLN COMPARISON:  CT 01/23/2018 scan if FINDINGS: Lower chest: Mild atelectasis at the LEFT lung base. Hepatobiliary: No focal hepatic lesion. Multiple round gallstones layer dependently within the gallbladder. No gallbladder inflammation. Common bile duct normal. Pancreas: Pancreas is normal. No ductal dilatation. No pancreatic inflammation. Spleen: Normal spleen Adrenals/urinary tract: Nodular lesion of the RIGHT adrenal gland again noted measuring 22 mm. Kidneys normal. Ureters and bladder normal. Stomach/Bowel: Stomach, duodenum and small-bowel normal. The cecum and terminal ileum reside within the RIGHT hemiscrotum via a large RIGHT inguinal hernia. Approximately 20 cm of cecum and ascending colon are within the RIGHT inguinal hernia sac. No evidence of obstruction. No change from CT 10 /15/19. The descending colon  enters a LEFT inguinal hernia with a shorter segment (approximately 8 cm). No obstruction. Rectum normal. Vascular/Lymphatic: Abdominal aorta is normal caliber with atherosclerotic calcification. There is no retroperitoneal or periportal lymphadenopathy. No pelvic lymphadenopathy. Reproductive: Prostate normal Other: No free fluid. Musculoskeletal: Severe degenerate change of the hips. There is remodeling of the LEFT femoral head. Degenerate changes in the lumbar spine. No acute findings. IMPRESSION: 1. No acute findings in the abdomen pelvis. 2. Large RIGHT inguinal hernia with the cecum and terminal ileum within the RIGHT hemiscrotum. No evidence of obstruction. 3. Cholelithiasis without cholecystitis. 4. Atherosclerotic calcification of the abdominal aorta. 5. Severe osteoarthritis of the hips. Degenerate remodeling of the LEFT femoral head. Electronically Signed   By: Genevive Bi M.D.   On: 04/25/2018 15:31   Dg Knee Complete 4 Views Left  Result Date: 04/25/2018 CLINICAL DATA:  Recent falls with left knee pain, initial encounter EXAM: LEFT KNEE -  COMPLETE 4+ VIEW COMPARISON:  01/16/2018 FINDINGS: Postsurgical changes are noted in the proximal tibia is stable from the prior exam. Healing proximal tibial and fibular fractures are again seen and stable. No soft tissue abnormality is noted. Vascular calcifications are seen. IMPRESSION: Postsurgical change without acute abnormality. Healing proximal tibial and fibular fractures. No acute abnormality is noted. Electronically Signed   By: Alcide Clever M.D.   On: 04/25/2018 14:41   Dg Hip Unilat With Pelvis 2-3 Views Left  Result Date: 04/25/2018 CLINICAL DATA:  Recent falls with left hip pain, initial encounter EXAM: DG HIP (WITH OR WITHOUT PELVIS) 3V LEFT COMPARISON:  01/30/2018 FINDINGS: Pelvic ring is intact. Remodeling of the left femoral head is noted with bone-on-bone contact in the superior aspect of the acetabulum. The overall appearance is  stable. No acute fracture or dislocation is seen. Bowel gas is noted over the inguinal canals bilaterally consistent with known hernias stable from the previous exam. IMPRESSION: Chronic degenerative changes of the left hip with remodeling of left femoral head. Bilateral inguinal hernias without obstructive change. Electronically Signed   By: Alcide Clever M.D.   On: 04/25/2018 14:40      ____________________________________________   PROCEDURES  Procedure(s) performed: None Procedures Critical Care performed: yes  CRITICAL CARE Performed by: Nita Sickle  ?  Total critical care time: 30 min  Critical care time was exclusive of separately billable procedures and treating other patients.  Critical care was necessary to treat or prevent imminent or life-threatening deterioration.  Critical care was time spent personally by me on the following activities: development of treatment plan with patient and/or surrogate as well as nursing, discussions with consultants, evaluation of patient's response to treatment, examination of patient, obtaining history from patient or surrogate, ordering and performing treatments and interventions, ordering and review of laboratory studies, ordering and review of radiographic studies, pulse oximetry and re-evaluation of patient's condition.  ____________________________________________   INITIAL IMPRESSION / ASSESSMENT AND PLAN / ED COURSE   68 y.o. male with a history of hypertension, hyperlipidemia, smoking and Ogilvie's syndrome who presents for evaluation of fall x 2 from his wheelchair.  Per EMS patient was found with extremely poor living conditions, patient has been using juice bottles for urination, there is trash covering the floors of his house and countertops.  Patient is disheveled, covered in dried stool, extremely poor hygiene.  Examination of his groin reveals open sore with significant erythema, induration, and swelling of the scrotum  and the GU region.  There is no bullae or crepitus at this time.  Presentation is concerning for cellulitis versus possible early Fournier's gangrene.  Labs are pending, will send patient for CT with contrast.    _________________________ 3:45 PM on 04/25/2018 -----------------------------------------  Labs concerning for sepsis from cellulitis with lactic of 2.04 and white count of 21.5.  CT showing no evidence of gas forming infection or deep abscess.  CT is also concerned for a large right inguinal hernia with no evidence of SBO.  Patient was given Vanco and Zosyn and will be admitted to the hospitalist service peer   As part of my medical decision making, I reviewed the following data within the electronic MEDICAL RECORD NUMBER Nursing notes reviewed and incorporated, Labs reviewed , EKG interpreted , Old chart reviewed, Radiograph reviewed , Discussed with admitting physician , Notes from prior ED visits and [place] Controlled Substance Database    Pertinent labs & imaging results that were available during my care of the patient were  reviewed by me and considered in my medical decision making (see chart for details).    ____________________________________________   FINAL CLINICAL IMPRESSION(S) / ED DIAGNOSES  Final diagnoses:  Sepsis without acute organ dysfunction, due to unspecified organism (HCC)  Cellulitis of groin  Unilateral inguinal hernia without obstruction or gangrene, recurrence not specified      NEW MEDICATIONS STARTED DURING THIS VISIT:  ED Discharge Orders    None       Note:  This document was prepared using Dragon voice recognition software and may include unintentional dictation errors.    Don PerkingVeronese, [place], MD 04/25/18 [phone]

## 2018-04-25 NOTE — ED Triage Notes (Addendum)
Pt arrived from home with concerns over multiple falls. Pt states he loses his balance or "just slips." EMS reports poor conditions at home with juice bottles used for urination and trash covering floors and counters. PT reports multiple episodes of diarrhea "for the last few days." Pt states he has pain in his left leg from the fall.

## 2018-04-25 NOTE — ED Notes (Signed)
Pt stuck X 2 by this RN and X 2 by Berline Lopes, RN unsuccessfully for second PIV access.

## 2018-04-26 DIAGNOSIS — L03314 Cellulitis of groin: Secondary | ICD-10-CM | POA: Diagnosis not present

## 2018-04-26 DIAGNOSIS — A419 Sepsis, unspecified organism: Secondary | ICD-10-CM | POA: Diagnosis not present

## 2018-04-26 LAB — BASIC METABOLIC PANEL
Anion gap: 6 (ref 5–15)
BUN: 26 mg/dL — ABNORMAL HIGH (ref 8–23)
CO2: 23 mmol/L (ref 22–32)
Calcium: 8.1 mg/dL — ABNORMAL LOW (ref 8.9–10.3)
Chloride: 108 mmol/L (ref 98–111)
Creatinine, Ser: 1.07 mg/dL (ref 0.61–1.24)
GFR calc Af Amer: >60 mL/min (ref >60)
Glucose, Bld: 99 mg/dL (ref 70–99)
Potassium: 3.7 mmol/L (ref 3.5–5.1)
Sodium: 137 mmol/L (ref 135–145)

## 2018-04-26 LAB — CBC
HCT: 35 % — ABNORMAL LOW (ref 39.0–52.0)
Hemoglobin: 11 g/dL — ABNORMAL LOW (ref 13.0–17.0)
MCH: 29.6 pg (ref 26.0–34.0)
MCHC: 31.4 g/dL (ref 30.0–36.0)
MCV: 94.3 fL (ref 80.0–100.0)
Platelets: 310 10*3/uL (ref 150–400)
RBC: 3.71 MIL/uL — ABNORMAL LOW (ref 4.22–5.81)
RDW: 15.6 % — AB (ref 11.5–15.5)
WBC: 14.4 10*3/uL — ABNORMAL HIGH (ref 4.0–10.5)
nRBC: 0 % (ref 0.0–0.2)

## 2018-04-26 MED ORDER — SODIUM CHLORIDE 0.9 % IV SOLN
INTRAVENOUS | Status: DC
  Administered 2018-04-26 – 2018-04-28 (×3): via INTRAVENOUS

## 2018-04-26 MED ORDER — FLUCONAZOLE 100 MG PO TABS
200.0000 mg | ORAL_TABLET | Freq: Once | ORAL | Status: AC
  Administered 2018-04-26: 200 mg via ORAL
  Filled 2018-04-26: qty 2

## 2018-04-26 MED ORDER — GERHARDT'S BUTT CREAM
TOPICAL_CREAM | Freq: Four times a day (QID) | CUTANEOUS | Status: DC
  Administered 2018-04-26 – 2018-04-28 (×10): via TOPICAL
  Filled 2018-04-26: qty 1

## 2018-04-26 NOTE — Consult Note (Signed)
WOC Nurse wound consult note Reason for Consult: Large area of moisture associated skin damage; buttocks, posterior and medial thighs, left hip, inguinal and scrotal/perineal. Partial thickness and full thickness wound (full thickness in right inguinal) Wound type: Moisture Pressure Injury POA: N/A Measurement: Right inguinal wound measures 2cm x 8cm x 0.2cm with 60% yellow fibrinous slough, 40% red, moist tissue. Small to moderate amount of serous to light yellow exudate that is trapped in inguinal skin fold. Other areas are partial thickness with moist wound bed, red/pink and scant to small amounts of serous exudate Wound bed:As described above Drainage (amount, consistency, odor): As described above Periwound: erythematous, indurated and edematous Dressing procedure/placement/frequency: I will provide a mattress replacement with low air loss feature to assist in the microclimate management for this patient, specifically to dry th macerated skin with the deleterious effects of prolonged exposure to dual incontinence including erythema and partial thickness tissue loss. Bilateral heel boots are provided to reduce pressure injury. We will implement a topical compounded product (Gerhart's Butt Cream), a 1:1:1 antifungal, moisture barrier and hydrocortisone cream, applied four times (4x) daily after cleansing with tepid tap water and gently, but thoroughly patting dry. Our house antimicrobial textile ([employer]+) will be used to treat the right inguinal area wound and will wick away excess moisture. Turning and repositioning from side to side to maximize air flow will be the cornerstone of the care plan.  Recommend a few doses of systemic antifungal (e.g., diflucan).  If you agree, please order.  WOC nursing team will not follow, but will remain available to this patient, the nursing and medical teams.  Please re-consult if needed. Thanks, Ladona Mow, MSN, RN, GNP, [place]  Pager#  [phone] c

## 2018-04-26 NOTE — Progress Notes (Signed)
[employer] [facility] - [place] at [facility]   PATIENT NAME: Alejandro SorrelJohn Cook    MR#:  [id number]  DATE OF BIRTH:  1950/05/16  SUBJECTIVE:  CHIEF COMPLAINT:   Chief Complaint  Patient presents with  . Fall  Patient seen and evaluated today Decreased pain and itching in the groin area No fever No chest pain, shortness of breath  REVIEW OF SYSTEMS:    ROS  CONSTITUTIONAL: No documented fever. No fatigue, weakness. No weight gain, no weight loss.  EYES: No blurry or double vision.  ENT: No tinnitus. No postnasal drip. No redness of the oropharynx.  RESPIRATORY: No cough, no wheeze, no hemoptysis. No dyspnea.  CARDIOVASCULAR: No chest pain. No orthopnea. No palpitations. No syncope.  GASTROINTESTINAL: No nausea, no vomiting or diarrhea. No abdominal pain. No melena or hematochezia.  GENITOURINARY: No dysuria or hematuria.  ENDOCRINE: No polyuria or nocturia. No heat or cold intolerance.  HEMATOLOGY: No anemia. No bruising. No bleeding.  INTEGUMENTARY: Redness of skin in the groin area.  MUSCULOSKELETAL: No arthritis. No swelling. No gout.  NEUROLOGIC: No numbness, tingling, or ataxia. No seizure-type activity.  PSYCHIATRIC: No anxiety. No insomnia. No ADD.   DRUG ALLERGIES:  No Known Allergies  VITALS:  Blood pressure 130/75, pulse 85, temperature 98.3 F (36.8 C), temperature source Oral, resp. rate 20, height 5\' 10"  (1.778 m), weight 90.7 kg, SpO2 96 %.  PHYSICAL EXAMINATION:   Physical Exam  GENERAL:  68 y.o.-year-old patient lying in the bed with no acute distress.  EYES: Pupils equal, round, reactive to light and accommodation. No scleral icterus. Extraocular muscles intact.  HEENT: Head atraumatic, normocephalic. Oropharynx and nasopharynx clear.  NECK:  Supple, no jugular venous distention. No thyroid enlargement, no tenderness.  LUNGS: Normal breath sounds bilaterally, no wheezing, rales, rhonchi. No use of accessory muscles of respiration.  CARDIOVASCULAR:  S1, S2 normal. No murmurs, rubs, or gallops.  ABDOMEN: Soft, nontender, nondistended. Bowel sounds present. No organomegaly  Has inguinal hernia Genitourinary area Has swelling and redness in the right inguinal area and posterior thigh EXTREMITIES: No cyanosis, clubbing or edema b/l.    NEUROLOGIC: Cranial nerves II through XII are intact. No focal Motor or sensory deficits b/l.   PSYCHIATRIC: The patient is alert and oriented x 3.  SKIN: Redness of the skin noted in the posterior thigh  LABORATORY PANEL:   CBC Recent Labs  Lab 04/26/18 0550  WBC 14.4*  HGB 11.0*  HCT 35.0*  PLT 310   ------------------------------------------------------------------------------------------------------------------ Chemistries  Recent Labs  Lab 04/25/18 1327 04/26/18 0550  NA 136 137  K 3.7 3.7  CL 104 108  CO2 25 23  GLUCOSE 113* 99  BUN 24* 26*  CREATININE 1.11 1.07  CALCIUM 8.7* 8.1*  AST 22  --   ALT 19  --   ALKPHOS 107  --   BILITOT 0.8  --    ------------------------------------------------------------------------------------------------------------------  Cardiac Enzymes No results for input(s): TROPONINI in the last 168 hours. ------------------------------------------------------------------------------------------------------------------  RADIOLOGY:  Ct Head Wo Contrast  Result Date: 04/25/2018 CLINICAL DATA:  The patient suffered multiple recent falls. Initial encounter. EXAM: CT HEAD WITHOUT CONTRAST TECHNIQUE: Contiguous axial images were obtained from the base of the skull through the vertex without intravenous contrast. COMPARISON:  None. FINDINGS: Brain: No evidence of acute infarction, hemorrhage, hydrocephalus, extra-axial collection or mass lesion/mass effect. There is chronic microvascular ischemic change and mild cortical atrophy. Vascular: No hyperdense vessel or unexpected calcification. Skull: Intact.  No focal lesion. Sinuses/Orbits: Negative. Other:  None.  IMPRESSION: No acute abnormality. Chronic microvascular ischemic change and mild cortical atrophy. Electronically Signed   By: Drusilla Kanner M.D.   On: 04/25/2018 15:04   Ct Abdomen Pelvis W Contrast  Result Date: 04/25/2018 CLINICAL DATA:  Pt arrived from home with concerns over multiple falls. Pt states he loses his balance or "just slips." EMS reports poor conditions at home with juice bottles used for urination and trash covering floors and counters. PT reports .*comment was truncated*^168mL OMNIPAQUE IOHEXOL 300 MG/ML SOLNAbd pain, unspecified EXAM: CT ABDOMEN AND PELVIS WITH CONTRAST TECHNIQUE: Multidetector CT imaging of the abdomen and pelvis was performed using the standard protocol following bolus administration of intravenous contrast. CONTRAST:  OMNIPAQUE IOHEXOL 300 MG/ML  SOLN COMPARISON:  CT 01/23/2018 scan if FINDINGS: Lower chest: Mild atelectasis at the LEFT lung base. Hepatobiliary: No focal hepatic lesion. Multiple round gallstones layer dependently within the gallbladder. No gallbladder inflammation. Common bile duct normal. Pancreas: Pancreas is normal. No ductal dilatation. No pancreatic inflammation. Spleen: Normal spleen Adrenals/urinary tract: Nodular lesion of the RIGHT adrenal gland again noted measuring 22 mm. Kidneys normal. Ureters and bladder normal. Stomach/Bowel: Stomach, duodenum and small-bowel normal. The cecum and terminal ileum reside within the RIGHT hemiscrotum via a large RIGHT inguinal hernia. Approximately 20 cm of cecum and ascending colon are within the RIGHT inguinal hernia sac. No evidence of obstruction. No change from CT 10 /15/19. The descending colon enters a LEFT inguinal hernia with a shorter segment (approximately 8 cm). No obstruction. Rectum normal. Vascular/Lymphatic: Abdominal aorta is normal caliber with atherosclerotic calcification. There is no retroperitoneal or periportal lymphadenopathy. No pelvic lymphadenopathy. Reproductive: Prostate  normal Other: No free fluid. Musculoskeletal: Severe degenerate change of the hips. There is remodeling of the LEFT femoral head. Degenerate changes in the lumbar spine. No acute findings. IMPRESSION: 1. No acute findings in the abdomen pelvis. 2. Large RIGHT inguinal hernia with the cecum and terminal ileum within the RIGHT hemiscrotum. No evidence of obstruction. 3. Cholelithiasis without cholecystitis. 4. Atherosclerotic calcification of the abdominal aorta. 5. Severe osteoarthritis of the hips. Degenerate remodeling of the LEFT femoral head. Electronically Signed   By: Genevive Bi M.D.   On: 04/25/2018 15:31   Dg Knee Complete 4 Views Left  Result Date: 04/25/2018 CLINICAL DATA:  Recent falls with left knee pain, initial encounter EXAM: LEFT KNEE - COMPLETE 4+ VIEW COMPARISON:  01/16/2018 FINDINGS: Postsurgical changes are noted in the proximal tibia is stable from the prior exam. Healing proximal tibial and fibular fractures are again seen and stable. No soft tissue abnormality is noted. Vascular calcifications are seen. IMPRESSION: Postsurgical change without acute abnormality. Healing proximal tibial and fibular fractures. No acute abnormality is noted. Electronically Signed   By: Alcide Clever M.D.   On: 04/25/2018 14:41   Dg Hip Unilat With Pelvis 2-3 Views Left  Result Date: 04/25/2018 CLINICAL DATA:  Recent falls with left hip pain, initial encounter EXAM: DG HIP (WITH OR WITHOUT PELVIS) 3V LEFT COMPARISON:  01/30/2018 FINDINGS: Pelvic ring is intact. Remodeling of the left femoral head is noted with bone-on-bone contact in the superior aspect of the acetabulum. The overall appearance is stable. No acute fracture or dislocation is seen. Bowel gas is noted over the inguinal canals bilaterally consistent with known hernias stable from the previous exam. IMPRESSION: Chronic degenerative changes of the left hip with remodeling of left femoral head. Bilateral inguinal hernias without obstructive  change. Electronically Signed   By: Eulah Pont.D.  On: 04/25/2018 14:40     ASSESSMENT AND PLAN:  68 year old male patient with a known history of hypertension, hyperlipidemia currently under hospitalist service with some redness and tenderness in the groin area.   -Sepsis secondary to groin cellulitis improving slowly Follow-up lactic acid level and cultures Continue IV vancomycin and cefepime antibiotic IV fluids  -Groin cellulitis Broad-spectrum IV antibiotics Oral Diflucan 1 dose Status post wound care consultation  -Right inguinal hernia with no evidence of obstruction Symptomatic care  -Osteoarthritis of hip Pain management  -DVT prophylaxis subcu Lovenox daily   All the records are reviewed and case discussed with Care Management/Social Worker. Management plans discussed with the patient, family and they are in agreement.  CODE STATUS: Full code  DVT Prophylaxis: SCDs  TOTAL TIME TAKING CARE OF THIS PATIENT: 35 minutes.   POSSIBLE D/C IN 2 to 3 DAYS, DEPENDING ON CLINICAL CONDITION.  Ihor Austin M.D on 04/26/2018 at 2:59 PM  Between 7am to 6pm - Pager - [phone]  After 6pm go to [email or web address] - password [email or web address] [facility]  [facility] [place] [facility]  Office  [phone]  CC: Primary care physician; Leanna Sato, MD  Note: This dictation was prepared with Dragon dictation along with smaller phrase technology. Any transcriptional errors that result from this process are unintentional.

## 2018-04-27 DIAGNOSIS — L03314 Cellulitis of groin: Secondary | ICD-10-CM | POA: Diagnosis not present

## 2018-04-27 DIAGNOSIS — A419 Sepsis, unspecified organism: Secondary | ICD-10-CM | POA: Diagnosis not present

## 2018-04-27 LAB — CBC
HCT: 32.1 % — ABNORMAL LOW (ref 39.0–52.0)
Hemoglobin: 10.2 g/dL — ABNORMAL LOW (ref 13.0–17.0)
MCH: 29.4 pg (ref 26.0–34.0)
MCHC: 31.8 g/dL (ref 30.0–36.0)
MCV: 92.5 fL (ref 80.0–100.0)
Platelets: 316 10*3/uL (ref 150–400)
RBC: 3.47 MIL/uL — ABNORMAL LOW (ref 4.22–5.81)
RDW: 15.2 % (ref 11.5–15.5)
WBC: 11.5 10*3/uL — AB (ref 4.0–10.5)
nRBC: 0 % (ref 0.0–0.2)

## 2018-04-27 LAB — CREATININE, SERUM
Creatinine, Ser: 0.77 mg/dL (ref 0.61–1.24)
GFR calc Af Amer: >60 mL/min (ref >60)
GFR calc non Af Amer: >60 mL/min (ref >60)

## 2018-04-27 LAB — LACTIC ACID, PLASMA: Lactic Acid, Venous: 0.8 mmol/L (ref 0.5–1.9)

## 2018-04-27 MED ORDER — VANCOMYCIN HCL 10 G IV SOLR
1250.0000 mg | Freq: Two times a day (BID) | INTRAVENOUS | Status: DC
  Administered 2018-04-27 – 2018-04-28 (×2): 1250 mg via INTRAVENOUS
  Filled 2018-04-27 (×3): qty 1250

## 2018-04-27 NOTE — Progress Notes (Signed)
[employer] [facility] - [place] at [facility]   PATIENT NAME: Alejandro Cook    MR#:  [id number]  DATE OF BIRTH:  [birth year]  SUBJECTIVE:  CHIEF COMPLAINT:   Chief Complaint  Patient presents with  . Fall  Patient seen and evaluated today Decreased redness swelling and itching in the groin area No fever No chest pain, shortness of breath  REVIEW OF SYSTEMS:    ROS  CONSTITUTIONAL: No documented fever. No fatigue, weakness. No weight gain, no weight loss.  EYES: No blurry or double vision.  ENT: No tinnitus. No postnasal drip. No redness of the oropharynx.  RESPIRATORY: No cough, no wheeze, no hemoptysis. No dyspnea.  CARDIOVASCULAR: No chest pain. No orthopnea. No palpitations. No syncope.  GASTROINTESTINAL: No nausea, no vomiting or diarrhea. No abdominal pain. No melena or hematochezia.  GENITOURINARY: No dysuria or hematuria.  ENDOCRINE: No polyuria or nocturia. No heat or cold intolerance.  HEMATOLOGY: No anemia. No bruising. No bleeding.  INTEGUMENTARY: Redness of skin in the groin area improving MUSCULOSKELETAL: No arthritis. No swelling. No gout.  NEUROLOGIC: No numbness, tingling, or ataxia. No seizure-type activity.  PSYCHIATRIC: No anxiety. No insomnia. No ADD.   DRUG ALLERGIES:  No Known Allergies  VITALS:  Blood pressure 132/89, pulse 70, temperature 97.6 F (36.4 C), temperature source Oral, resp. rate 20, height 5\' 10"  (1.778 m), weight 90.7 kg, SpO2 97 %.  PHYSICAL EXAMINATION:   Physical Exam  GENERAL:  68 y.o.-year-old patient lying in the bed with no acute distress.  EYES: Pupils equal, round, reactive to light and accommodation. No scleral icterus. Extraocular muscles intact.  HEENT: Head atraumatic, normocephalic. Oropharynx and nasopharynx clear.  NECK:  Supple, no jugular venous distention. No thyroid enlargement, no tenderness.  LUNGS: Normal breath sounds bilaterally, no wheezing, rales, rhonchi. No use of accessory muscles of respiration.   CARDIOVASCULAR: S1, S2 normal. No murmurs, rubs, or gallops.  ABDOMEN: Soft, nontender, nondistended. Bowel sounds present. No organomegaly  Has inguinal hernia Genitourinary area Has swelling and redness in the right inguinal area and posterior thigh EXTREMITIES: No cyanosis, clubbing or edema b/l.    NEUROLOGIC: Cranial nerves II through XII are intact. No focal Motor or sensory deficits b/l.   PSYCHIATRIC: The patient is alert and oriented x 3.  SKIN: Redness of the skin noted in the posterior thigh  LABORATORY PANEL:   CBC Recent Labs  Lab 04/27/18 0557  WBC 11.5*  HGB 10.2*  HCT 32.1*  PLT 316   ------------------------------------------------------------------------------------------------------------------ Chemistries  Recent Labs  Lab 04/25/18 1327 04/26/18 0550 04/27/18 0557  NA 136 137  --   K 3.7 3.7  --   CL 104 108  --   CO2 25 23  --   GLUCOSE 113* 99  --   BUN 24* 26*  --   CREATININE 1.11 1.07 0.77  CALCIUM 8.7* 8.1*  --   AST 22  --   --   ALT 19  --   --   ALKPHOS 107  --   --   BILITOT 0.8  --   --    ------------------------------------------------------------------------------------------------------------------  Cardiac Enzymes No results for input(s): TROPONINI in the last 168 hours. ------------------------------------------------------------------------------------------------------------------  RADIOLOGY:  Ct Head Wo Contrast  Result Date: 04/25/2018 CLINICAL DATA:  The patient suffered multiple recent falls. Initial encounter. EXAM: CT HEAD WITHOUT CONTRAST TECHNIQUE: Contiguous axial images were obtained from the base of the skull through the vertex without intravenous contrast. COMPARISON:  None. FINDINGS: Brain: No evidence  of acute infarction, hemorrhage, hydrocephalus, extra-axial collection or mass lesion/mass effect. There is chronic microvascular ischemic change and mild cortical atrophy. Vascular: No hyperdense vessel or  unexpected calcification. Skull: Intact.  No focal lesion. Sinuses/Orbits: Negative. Other: None. IMPRESSION: No acute abnormality. Chronic microvascular ischemic change and mild cortical atrophy. Electronically Signed   By: Drusilla Kannerhomas  Dalessio M.D.   On: 04/25/2018 15:04   Ct Abdomen Pelvis W Contrast  Result Date: 04/25/2018 CLINICAL DATA:  Pt arrived from home with concerns over multiple falls. Pt states he loses his balance or "just slips." EMS reports poor conditions at home with juice bottles used for urination and trash covering floors and counters. PT reports .*comment was truncated*^16400mL OMNIPAQUE IOHEXOL 300 MG/ML SOLNAbd pain, unspecified EXAM: CT ABDOMEN AND PELVIS WITH CONTRAST TECHNIQUE: Multidetector CT imaging of the abdomen and pelvis was performed using the standard protocol following bolus administration of intravenous contrast. CONTRAST:  100mL OMNIPAQUE IOHEXOL 300 MG/ML  SOLN COMPARISON:  CT 01/23/2018 scan if FINDINGS: Lower chest: Mild atelectasis at the LEFT lung base. Hepatobiliary: No focal hepatic lesion. Multiple round gallstones layer dependently within the gallbladder. No gallbladder inflammation. Common bile duct normal. Pancreas: Pancreas is normal. No ductal dilatation. No pancreatic inflammation. Spleen: Normal spleen Adrenals/urinary tract: Nodular lesion of the RIGHT adrenal gland again noted measuring 22 mm. Kidneys normal. Ureters and bladder normal. Stomach/Bowel: Stomach, duodenum and small-bowel normal. The cecum and terminal ileum reside within the RIGHT hemiscrotum via a large RIGHT inguinal hernia. Approximately 20 cm of cecum and ascending colon are within the RIGHT inguinal hernia sac. No evidence of obstruction. No change from CT 10 /15/19. The descending colon enters a LEFT inguinal hernia with a shorter segment (approximately 8 cm). No obstruction. Rectum normal. Vascular/Lymphatic: Abdominal aorta is normal caliber with atherosclerotic calcification. There is no  retroperitoneal or periportal lymphadenopathy. No pelvic lymphadenopathy. Reproductive: Prostate normal Other: No free fluid. Musculoskeletal: Severe degenerate change of the hips. There is remodeling of the LEFT femoral head. Degenerate changes in the lumbar spine. No acute findings. IMPRESSION: 1. No acute findings in the abdomen pelvis. 2. Large RIGHT inguinal hernia with the cecum and terminal ileum within the RIGHT hemiscrotum. No evidence of obstruction. 3. Cholelithiasis without cholecystitis. 4. Atherosclerotic calcification of the abdominal aorta. 5. Severe osteoarthritis of the hips. Degenerate remodeling of the LEFT femoral head. Electronically Signed   By: Genevive BiStewart  Edmunds M.D.   On: 04/25/2018 15:31   Dg Knee Complete 4 Views Left  Result Date: 04/25/2018 CLINICAL DATA:  Recent falls with left knee pain, initial encounter EXAM: LEFT KNEE - COMPLETE 4+ VIEW COMPARISON:  01/16/2018 FINDINGS: Postsurgical changes are noted in the proximal tibia is stable from the prior exam. Healing proximal tibial and fibular fractures are again seen and stable. No soft tissue abnormality is noted. Vascular calcifications are seen. IMPRESSION: Postsurgical change without acute abnormality. Healing proximal tibial and fibular fractures. No acute abnormality is noted. Electronically Signed   By: Alcide CleverMark  Lukens M.D.   On: 04/25/2018 14:41   Dg Hip Unilat With Pelvis 2-3 Views Left  Result Date: 04/25/2018 CLINICAL DATA:  Recent falls with left hip pain, initial encounter EXAM: DG HIP (WITH OR WITHOUT PELVIS) 3V LEFT COMPARISON:  01/30/2018 FINDINGS: Pelvic ring is intact. Remodeling of the left femoral head is noted with bone-on-bone contact in the superior aspect of the acetabulum. The overall appearance is stable. No acute fracture or dislocation is seen. Bowel gas is noted over the inguinal canals bilaterally consistent with known  hernias stable from the previous exam. IMPRESSION: Chronic degenerative changes of the  left hip with remodeling of left femoral head. Bilateral inguinal hernias without obstructive change. Electronically Signed   By: Alcide CleverMark  Lukens M.D.   On: 04/25/2018 14:40     ASSESSMENT AND PLAN:  68 year old male patient with a known history of hypertension, hyperlipidemia currently under hospitalist service with some redness and tenderness in the groin area.   -Sepsis secondary to groin cellulitis improving  Continue IV vancomycin and cefepime antibiotic IV fluids  Follow-up cultures  -Groin cellulitis Broad-spectrum IV antibiotics on board Oral Diflucan 1 dose Status post wound care consultation Daily wound care  -Right inguinal hernia with no evidence of obstruction Supportive care  -Osteoarthritis of hip Pain management  -DVT prophylaxis subcu Lovenox daily   All the records are reviewed and case discussed with Care Management/Social Worker. Management plans discussed with the patient, family and they are in agreement.  CODE STATUS: Full code  DVT Prophylaxis: SCDs  TOTAL TIME TAKING CARE OF THIS PATIENT: 35 minutes.   POSSIBLE D/C IN 1 DAYS, DEPENDING ON CLINICAL CONDITION.  Ihor AustinPavan Tauri Ethington M.D on 04/27/2018 at 1:26 PM  Between 7am to 6pm - Pager - [phone]  After 6pm go to [email or web address] - password [email or web address] [email or web address]  [facility] [place] [facility]  Office  [phone]  CC: Primary care physician; Leanna SatoMiles, Linda M, MD  Note: This dictation was prepared with Dragon dictation along with smaller phrase technology. Any transcriptional errors that result from this process are unintentional.

## 2018-04-27 NOTE — Consult Note (Signed)
Pharmacy Antibiotic Note  Alejandro Cook is a 68 y.o. male admitted on 04/25/2018 with sepsis/skin and soft tissue infection.    Pharmacy has been consulted for cefepime and vancomycin dosing.  Plan: Cefepime 2 gm IV every 8 hours   Dose change: Vancomycin 1250mg  IV q 12 hrs.  Goal AUC 400-550 Expected AUC: 484 Scr used: 0.8  Height: 5\' 10"  (177.8 cm) Weight: 200 lb (90.7 kg) IBW/kg (Calculated) : 73  Temp (24hrs), Avg:98.3 F (36.8 C), Min:98.1 F (36.7 C), Max:98.5 F (36.9 C)  Recent Labs  Lab 04/25/18 1327 04/25/18 1335 04/25/18 1647 04/26/18 0550 04/27/18 0557  WBC 21.5*  --   --  14.4* 11.5*  CREATININE 1.11  --   --  1.07 0.77  LATICACIDVEN  --  2.04* 2.1*  --  0.8    Estimated Creatinine Clearance: 98.7 mL/min (by C-G formula based on SCr of 0.77 mg/dL).    No Known Allergies  Antimicrobials this admission: Vanco 1/15 >>  Cefepime 1/15 >>  Zosyn x 1 dose in ED  Dose adjustments this admission: Old dosing (1/15): Vancomycin 1000 mg IV Q 12 hrs. Goal AUC 400-550. Expected AUC: 526 SCr used: 1.11  New Dosing (1/17 1500): Vancomycin 1250mg  IV q 12 hrs.  Goal AUC 400-550 Expected AUC: 484 Scr used: 0.8   Microbiology results: 1/15 BCx: pending  Thank you for allowing pharmacy to be a part of this patient's care.  Albina Billet, PharmD, BCPS Clinical Pharmacist 04/27/2018 8:17 AM

## 2018-04-27 NOTE — Care Management Important Message (Signed)
Copy of signed Medicare IM left with patient in room. 

## 2018-04-28 DIAGNOSIS — A419 Sepsis, unspecified organism: Secondary | ICD-10-CM | POA: Diagnosis not present

## 2018-04-28 DIAGNOSIS — L03314 Cellulitis of groin: Secondary | ICD-10-CM | POA: Diagnosis not present

## 2018-04-28 LAB — CBC
HCT: 35.3 % — ABNORMAL LOW (ref 39.0–52.0)
Hemoglobin: 11.2 g/dL — ABNORMAL LOW (ref 13.0–17.0)
MCH: 29.7 pg (ref 26.0–34.0)
MCHC: 31.7 g/dL (ref 30.0–36.0)
MCV: 93.6 fL (ref 80.0–100.0)
PLATELETS: 315 10*3/uL (ref 150–400)
RBC: 3.77 MIL/uL — ABNORMAL LOW (ref 4.22–5.81)
RDW: 14.9 % (ref 11.5–15.5)
WBC: 11.3 10*3/uL — AB (ref 4.0–10.5)
nRBC: 0 % (ref 0.0–0.2)

## 2018-04-28 MED ORDER — FLUCONAZOLE 100 MG PO TABS: 100.0000 mg | ORAL_TABLET | Freq: Every day | ORAL | 0 refills | Status: AC

## 2018-04-28 MED ORDER — CLINDAMYCIN HCL 300 MG PO CAPS: 300.0000 mg | ORAL_CAPSULE | Freq: Three times a day (TID) | ORAL | 0 refills | Status: AC

## 2018-04-28 MED ORDER — GERHARDT'S BUTT CREAM: 1.0000 "application " | TOPICAL_CREAM | Freq: Four times a day (QID) | CUTANEOUS | 0 refills | Status: AC

## 2018-04-28 NOTE — Progress Notes (Signed)
Pt discharged per MD order. IV removed. Discharge instructions reviewed with pt. Prescription given to pt. Pt taken home via EMS.

## 2018-04-28 NOTE — Discharge Summary (Signed)
[employer] - [place] at [facility]   PATIENT NAME: Alejandro Cook    MR#:  [id number]  DATE OF BIRTH:  September 21, 1950  DATE OF ADMISSION:  04/25/2018 ADMITTING PHYSICIAN: Ihor Austin, MD  DATE OF DISCHARGE: 04/28/2018  PRIMARY CARE PHYSICIAN: Leanna Sato, MD   ADMISSION DIAGNOSIS:  Cellulitis of groin [L03.314] Unilateral inguinal hernia without obstruction or gangrene, recurrence not specified [K40.90] Sepsis without acute organ dysfunction, due to unspecified organism (HCC) [A41.9]  DISCHARGE DIAGNOSIS:  Active Problems:   Sepsis (HCC) Cellulitis of the groin Right inguinal hernia Osteoarthritis of the hip  SECONDARY DIAGNOSIS:   Past Medical History:  Diagnosis Date  . Depression   . Hyperlipidemia   . Hypertension      ADMITTING HISTORY  Alejandro Cook  is a 68 y.o. male with a known history of hypertension, hyperlipidemia presented to the emergency room with some redness and tenderness in the groin area.  It has history of inguinal hernia.  Patient was evaluated in the emergency room was found to be septic from cellulitis of the groin area.  Started on broad-spectrum IV antibiotics.  His WBC count and lactic acid level are elevated.  He was worked up with CT abdomen which showed cholelithiasis, large right inguinal hernia without obstruction.  CT head showed no acute abnormality.  Is wheelchair-bound at home.   HOSPITAL COURSE:  Patient was admitted to medical floor.  Was worked up with CT abdomen which showed a large right inguinal hernia but no obstruction.  Patient was started on IV vancomycin and cefepime antibiotics for groin cellulitis.  He had sepsis secondary to cellulitis.  Lactic acid levels were checked.  Lactic acid level was elevated at the time of admission along with WBC count around 21,000.  With antibiotics and fluids lactic acid level is come down as well as WBC count also came down.  Chronic cellulitis improved.  Wound care consultation was  done during the hospitalization and recommendations followed.  Blood cultures no growth.  Patient WBC is normal tolerating diet well will be discharged home with home health services.  CONSULTS OBTAINED:    DRUG ALLERGIES:  No Known Allergies  DISCHARGE MEDICATIONS:   Allergies as of 04/28/2018   No Known Allergies     Medication List    TAKE these medications   clindamycin 300 MG capsule Commonly known as:  CLEOCIN Take 1 capsule (300 mg total) by mouth 3 (three) times daily for 7 days.   diclofenac 75 MG EC tablet Commonly known as:  VOLTAREN Take 1 tablet by mouth 2 (two) times daily.   DULoxetine 60 MG capsule Commonly known as:  CYMBALTA Take 1 capsule by mouth daily.   fluconazole 100 MG tablet Commonly known as:  DIFLUCAN Take 1 tablet (100 mg total) by mouth daily for 7 days.   Gerhardt's butt cream Crea Apply 1 application topically 4 (four) times daily for 21 days.   lisinopril-hydrochlorothiazide 20-12.5 MG tablet Commonly known as:  PRINZIDE,ZESTORETIC Take 1 tablet by mouth daily.   lovastatin 40 MG tablet Commonly known as:  MEVACOR Take 1 tablet (40 mg total) by mouth daily.       Today  Patient seen today Decreased redness in the groin area Tolerating diet well No fever Hemodynamically stable  VITAL SIGNS:  Blood pressure (!) 145/76, pulse 68, temperature 98.3 F (36.8 C), temperature source Oral, resp. rate 20, height 5\' 10"  (1.778 m), weight 90.7 kg, SpO2 94 %.  I/O:  Intake/Output Summary (Last 24 hours) at 04/28/2018 1245 Last data filed at 04/28/2018 1009 Gross per 24 hour  Intake 2396.56 ml  Output 350 ml  Net 2046.56 ml    PHYSICAL EXAMINATION:  Physical Exam  GENERAL:  68 y.o.-year-old patient lying in the bed with no acute distress.  LUNGS: Normal breath sounds bilaterally, no wheezing, rales,rhonchi or crepitation. No use of accessory muscles of respiration.  CARDIOVASCULAR: S1, S2 normal. No murmurs, rubs, or gallops.   ABDOMEN: Soft, non-tender, non-distended. Bowel sounds present. No organomegaly or mass.  NEUROLOGIC: Moves all 4 extremities. PSYCHIATRIC: The patient is alert and oriented x 3.  SKIN: No obvious rash, lesion, or ulcer.   DATA REVIEW:   CBC Recent Labs  Lab 04/28/18 0514  WBC 11.3*  HGB 11.2*  HCT 35.3*  PLT 315    Chemistries  Recent Labs  Lab 04/25/18 1327 04/26/18 0550 04/27/18 0557  NA 136 137  --   K 3.7 3.7  --   CL 104 108  --   CO2 25 23  --   GLUCOSE 113* 99  --   BUN 24* 26*  --   CREATININE 1.11 1.07 0.77  CALCIUM 8.7* 8.1*  --   AST 22  --   --   ALT 19  --   --   ALKPHOS 107  --   --   BILITOT 0.8  --   --     Cardiac Enzymes No results for input(s): TROPONINI in the last 168 hours.  Microbiology Results  Results for orders placed or performed during the hospital encounter of 04/25/18  Blood culture (routine x 2)     Status: None (Preliminary result)   Collection Time: 04/25/18  1:29 PM  Result Value Ref Range Status   Specimen Description BLOOD BLOOD RIGHT FOREARM  Final   Special Requests   Final    BOTTLES DRAWN AEROBIC AND ANAEROBIC Blood Culture results may not be optimal due to an inadequate volume of blood received in culture bottles   Culture   Final    NO GROWTH 3 DAYS Performed at [facility], [address] [place], [place] [address]    Report Status PENDING  Incomplete  Blood culture (routine x 2)     Status: None (Preliminary result)   Collection Time: 04/25/18  1:29 PM  Result Value Ref Range Status   Specimen Description BLOOD RIGHT WRIST  Final   Special Requests   Final    BOTTLES DRAWN AEROBIC AND ANAEROBIC Blood Culture results may not be optimal due to an inadequate volume of blood received in culture bottles   Culture   Final    NO GROWTH 3 DAYS Performed at [facility], [address], [place], [place] [address]    Report Status PENDING  Incomplete    RADIOLOGY:  No results found.  Follow  up with PCP in 1 week.  Management plans discussed with the patient, family and they are in agreement.  CODE STATUS: Full code    Code Status Orders  (From admission, onward)         Start     Ordered   04/25/18 1745  Full code  Continuous     04/25/18 1744        Code Status History    Date Active Date Inactive Code Status Order ID Comments User Context   03/02/2018 2123 03/05/2018 1733 Full Code [id number]  Ihor Austin, MD Inpatient   01/23/2018 2153  01/31/2018 1837 Full Code [id number]  Altamese DillingVachhani, Vaibhavkumar, MD Inpatient   01/14/2018 2050 01/18/2018 1917 Full Code [id number]  Ihor AustinPyreddy, Pavan, MD Inpatient      TOTAL TIME TAKING CARE OF THIS PATIENT ON DAY OF DISCHARGE: more than 36 minutes.   Ihor AustinPavan Pyreddy M.D on 04/28/2018 at 12:45 PM  Between 7am to 6pm - Pager - [phone]  After 6pm go to [email or web address] - password [email or web address] [facility]  [facility] [place] [facility]  Office  [phone]  CC: Primary care physician; Leanna SatoMiles, Linda M, MD  Note: This dictation was prepared with Dragon dictation along with smaller phrase technology. Any transcriptional errors that result from this process are unintentional.

## 2018-04-28 NOTE — Progress Notes (Signed)
EMS called for transport.

## 2018-04-28 NOTE — Care Management Note (Signed)
Case Management Note  Patient Details  Name: DEAVEN SANTER MRN: [id number] Date of Birth: June 09, 1950  Subjective/Objective:   Patient to be discharged per MD order. Orders in place for home health services. CMS Medicare.gov Compare Post Acute Care list reviewed with patient and he prefers [facility]. Referral placed with Jermaine. No DME needs. Patient uses wheelchair at home. Will require EMS transport. Has PCS 4 times a week.                Action/Plan:   Expected Discharge Date:  04/28/18               Expected Discharge Plan:  Home w Home Health Services  In-House Referral:     Discharge planning Services  CM Consult  Post Acute Care Choice:  Home Health Choice offered to:  Patient  DME Arranged:    DME Agency:     HH Arranged:  RN, PT, Nurse's Aide HH Agency:  [facility]  Status of Service:  Completed, signed off  If discussed at Long Length of Stay Meetings, dates discussed:    Additional Comments:  Virgel Manifold, RN 04/28/2018, 12:07 PM

## 2018-04-30 LAB — CULTURE, BLOOD (ROUTINE X 2)
CULTURE: NO GROWTH
CULTURE: NO GROWTH

## 2018-05-01 ENCOUNTER — Encounter: Payer: Self-pay | Admitting: *Deleted

## 2018-05-01 ENCOUNTER — Emergency Department
Admission: EM | Admit: 2018-05-01 | Discharge: 2018-05-02 | Disposition: A | Discharge status: Skilled Nursing Facility | Payer: Medicare HMO | Attending: Emergency Medicine | Admitting: Emergency Medicine

## 2018-05-01 DIAGNOSIS — I10 Essential (primary) hypertension: Secondary | ICD-10-CM | POA: Insufficient documentation

## 2018-05-01 DIAGNOSIS — F17210 Nicotine dependence, cigarettes, uncomplicated: Secondary | ICD-10-CM | POA: Insufficient documentation

## 2018-05-01 DIAGNOSIS — R531 Weakness: Secondary | ICD-10-CM | POA: Diagnosis present

## 2018-05-01 DIAGNOSIS — L0390 Cellulitis, unspecified: Secondary | ICD-10-CM | POA: Insufficient documentation

## 2018-05-01 DIAGNOSIS — R627 Adult failure to thrive: Secondary | ICD-10-CM | POA: Insufficient documentation

## 2018-05-01 DIAGNOSIS — R6251 Failure to thrive (child): Secondary | ICD-10-CM

## 2018-05-01 HISTORY — DX: Pressure-induced deep tissue damage of unspecified buttock: L89.306

## 2018-05-01 LAB — URINALYSIS, COMPLETE (UACMP) WITH MICROSCOPIC
Bacteria, UA: NONE SEEN
Bilirubin Urine: NEGATIVE
Glucose, UA: NEGATIVE mg/dL
Hgb urine dipstick: NEGATIVE
Ketones, ur: 5 mg/dL — AB
Leukocytes, UA: NEGATIVE
Nitrite: NEGATIVE
PH: 6 (ref 5.0–8.0)
Protein, ur: NEGATIVE mg/dL
Specific Gravity, Urine: 1.021 (ref 1.005–1.030)

## 2018-05-01 LAB — CBC
HCT: 36.6 % — ABNORMAL LOW (ref 39.0–52.0)
Hemoglobin: 11.7 g/dL — ABNORMAL LOW (ref 13.0–17.0)
MCH: 29.5 pg (ref 26.0–34.0)
MCHC: 32 g/dL (ref 30.0–36.0)
MCV: 92.4 fL (ref 80.0–100.0)
Platelets: 398 10*3/uL (ref 150–400)
RBC: 3.96 MIL/uL — ABNORMAL LOW (ref 4.22–5.81)
RDW: 15.6 % — ABNORMAL HIGH (ref 11.5–15.5)
WBC: 14.1 10*3/uL — ABNORMAL HIGH (ref 4.0–10.5)
nRBC: 0 % (ref 0.0–0.2)

## 2018-05-01 LAB — BASIC METABOLIC PANEL
Anion gap: 8 (ref 5–15)
BUN: 13 mg/dL (ref 8–23)
CO2: 28 mmol/L (ref 22–32)
Calcium: 8.9 mg/dL (ref 8.9–10.3)
Chloride: 104 mmol/L (ref 98–111)
Creatinine, Ser: 0.67 mg/dL (ref 0.61–1.24)
GFR calc Af Amer: >60 mL/min (ref >60)
GFR calc non Af Amer: >60 mL/min (ref >60)
Glucose, Bld: 112 mg/dL — ABNORMAL HIGH (ref 70–99)
Potassium: 3.8 mmol/L (ref 3.5–5.1)
Sodium: 140 mmol/L (ref 135–145)

## 2018-05-01 MED ORDER — DULOXETINE HCL 60 MG PO CPEP
60.0000 mg | ORAL_CAPSULE | Freq: Every day | ORAL | Status: DC
  Administered 2018-05-02: 60 mg via ORAL
  Filled 2018-05-01 (×2): qty 1

## 2018-05-01 MED ORDER — ACETAMINOPHEN 500 MG PO TABS
1000.0000 mg | ORAL_TABLET | Freq: Once | ORAL | Status: AC
  Administered 2018-05-01: 1000 mg via ORAL
  Filled 2018-05-01: qty 2

## 2018-05-01 MED ORDER — GERHARDT'S BUTT CREAM
1.0000 "application " | TOPICAL_CREAM | Freq: Four times a day (QID) | CUTANEOUS | Status: DC
  Administered 2018-05-01 (×3): 1 via TOPICAL

## 2018-05-01 MED ORDER — CLINDAMYCIN HCL 150 MG PO CAPS
300.0000 mg | ORAL_CAPSULE | Freq: Three times a day (TID) | ORAL | Status: DC
  Administered 2018-05-01 – 2018-05-02 (×4): 300 mg via ORAL
  Filled 2018-05-01 (×5): qty 2

## 2018-05-01 MED ORDER — PRAVASTATIN SODIUM 40 MG PO TABS
40.0000 mg | ORAL_TABLET | Freq: Every day | ORAL | Status: DC
  Administered 2018-05-01: 40 mg via ORAL
  Filled 2018-05-01 (×2): qty 1

## 2018-05-01 MED ORDER — LISINOPRIL 10 MG PO TABS
20.0000 mg | ORAL_TABLET | Freq: Every day | ORAL | Status: DC
  Administered 2018-05-01 – 2018-05-02 (×2): 20 mg via ORAL
  Filled 2018-05-01 (×2): qty 2

## 2018-05-01 MED ORDER — BACITRACIN ZINC 500 UNIT/GM EX OINT
TOPICAL_OINTMENT | CUTANEOUS | Status: DC | PRN
  Filled 2018-05-01: qty 3.6

## 2018-05-01 MED ORDER — GERHARDT'S BUTT CREAM
TOPICAL_CREAM | Freq: Two times a day (BID) | CUTANEOUS | Status: DC
  Administered 2018-05-01 (×2): via TOPICAL
  Filled 2018-05-01 (×2): qty 1

## 2018-05-01 MED ORDER — DICLOFENAC SODIUM 75 MG PO TBEC
75.0000 mg | DELAYED_RELEASE_TABLET | Freq: Two times a day (BID) | ORAL | Status: DC
  Administered 2018-05-01 – 2018-05-02 (×2): 75 mg via ORAL
  Filled 2018-05-01 (×4): qty 1

## 2018-05-01 MED ORDER — HYDROCODONE-ACETAMINOPHEN 5-325 MG PO TABS
1.0000 | ORAL_TABLET | Freq: Four times a day (QID) | ORAL | Status: DC | PRN
  Administered 2018-05-01 – 2018-05-02 (×3): 1 via ORAL
  Filled 2018-05-01 (×3): qty 1

## 2018-05-01 MED ORDER — FLUCONAZOLE 100 MG PO TABS
100.0000 mg | ORAL_TABLET | Freq: Every day | ORAL | Status: DC
  Administered 2018-05-01 – 2018-05-02 (×2): 100 mg via ORAL
  Filled 2018-05-01 (×2): qty 1

## 2018-05-01 MED ORDER — GERHARDT'S BUTT CREAM
TOPICAL_CREAM | CUTANEOUS | Status: DC | PRN
  Filled 2018-05-01: qty 1

## 2018-05-01 MED ORDER — LISINOPRIL-HYDROCHLOROTHIAZIDE 20-12.5 MG PO TABS
1.0000 | ORAL_TABLET | Freq: Every day | ORAL | Status: DC
  Filled 2018-05-01: qty 1

## 2018-05-01 MED ORDER — SODIUM CHLORIDE 0.9 % IV BOLUS
1000.0000 mL | Freq: Once | INTRAVENOUS | Status: AC
  Administered 2018-05-01: 1000 mL via INTRAVENOUS

## 2018-05-01 MED ORDER — HYDROCHLOROTHIAZIDE 12.5 MG PO CAPS
12.5000 mg | ORAL_CAPSULE | Freq: Every day | ORAL | Status: DC
  Administered 2018-05-01 – 2018-05-02 (×2): 12.5 mg via ORAL
  Filled 2018-05-01 (×2): qty 1

## 2018-05-01 NOTE — ED Provider Notes (Signed)
Discussed with patient with Child psychotherapist.  He is interested and will pursue long-term placement.  He acknowledges that it is not realistic for him to go to his current home situation and with his bed sores and broken leg.  I think this is in the patient's best interest to be true long-term as well.  Give some as needed pain medication given his discomfort from his bedsores.  Will be here through tomorrow afternoon while he awaits placement, have also placed consults wound care, will place the patient in a hospital bed and do 2-hour turns, and continue his current medication and antibiotic.  Patient comfortable with this plan.  Has eaten dinner tonight.  Resting at this time.   Sharyn Creamer, MD 05/01/18 [phone]

## 2018-05-01 NOTE — ED Notes (Signed)
Pt was placed on bedpan for additional bm, pt had small amount of soft stool, cleaned pt up with wet wipes, pattd dry and appied cream (see MAR) as needed. Repositioned pt again and floated heels (heels have significant pressure injury and right heel has large dark blister, advised pt to keep it floated. Pt given [place] sandwich tray, cut sandwich for pt.

## 2018-05-01 NOTE — ED Notes (Signed)
Urinal given, advised pt that urine sample is needed

## 2018-05-01 NOTE — ED Notes (Signed)
Pt has been turned several times since here in ED, heels (which are significantly pressure damaged floated for pt comfort).  At this time pt cleaned and turned to right side

## 2018-05-01 NOTE — ED Triage Notes (Addendum)
Pt from home by ems.  Home health came to house to assume services and found living conditions that were so poor that they had to call 911 to have him brought to the ED.  Pt lives in a home by himself and is unable to take care of himself, pt is wheelchair bound since tibfib fx in October. House has no runningn water and per ems it was cool in home, space heater for heat. Pt was recently hospitalized for bedsores per ems and dc home on Saturday. Pt has not eaten a meal since he was discharged from the hospital on Saturday.  Pt is still wearing hospital gown that he was discharged in on Saturday. Pt has extensive bed sores visible which extend down on his thighs (entire sitting surface to where WC ends mid thigh)  Pt has some dried stool seen. Pt placed on bedpan on arrival.

## 2018-05-01 NOTE — ED Provider Notes (Signed)
[facility] Emergency Department Provider Note  ____________________________________________  Time seen: Approximately 1:39 PM  I have reviewed the triage vital signs and the nursing notes.   HISTORY  Chief Complaint Weakness    HPI Alejandro Cook is a 69 y.o. male discharge 1/18 for sepsis related to cellulitis and skin breakdown presenting for inability to care for himself.  The patient reports that since arriving home, he has had arthritis pain which prevents him from getting out of bed.  EMS found the patient to still be wearing his hospital gown, and be covered in dried feces, and reporting he had not had anything to eat or drink in 3 days.  The patient denies any new symptoms including worsening bedsores, fevers or chills, nausea or vomiting.    Past Medical History:  Diagnosis Date  . Depression   . Hyperlipidemia   . Hypertension   . 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 Sepsis (HCC) 04/25/2018  . Cellulitis 03/02/2018  . Ileus (HCC)   . Ogilvie's syndrome   . Large bowel obstruction (HCC) 01/23/2018  . Hernia of abdominal wall 01/23/2018  . Tibia fracture 01/14/2018    Past Surgical History:  Procedure Laterality Date  . CLOSED REDUCTION TIBIA Left 01/16/2018   Procedure: CLOSED REDUCTION TIBIA VS. ORIF TIBIA;  Surgeon: Juanell Fairly, MD;  Location: [facility] ORS;  Service: Orthopedics;  Laterality: Left;  . ORIF TIBIA FRACTURE Left 01/16/2018   Procedure: OPEN REDUCTION INTERNAL FIXATION (ORIF) TIBIA FRACTURE;  Surgeon: Juanell Fairly, MD;  Location: [facility] ORS;  Service: Orthopedics;  Laterality: Left;    Current Outpatient Rx  . Order #: [id number] Class: Normal  . Order #: [id number] Class: Historical Med  . Order #: [id number] Class: Historical Med  . Order #: [id number] Class: Normal  . Order #: [id number] Class: Print  . Order #: [id number] Class: Historical Med  . Order #:  [id number] Class: No Print    Allergies Patient has no known allergies.  Family History  Problem Relation Age of Onset  . Breast cancer Mother   . CVA Father     Social History Social History   Tobacco Use  . Smoking status: Current Every Day Smoker    Packs/day: 0.50    Types: Cigarettes  . Smokeless tobacco: Never Used  Substance Use Topics  . Alcohol use: Not Currently    Frequency: Never  . Drug use: Yes    Types: Marijuana    Review of Systems Constitutional: No fever/chills.  He did get out of bed. Eyes: No visual changes. ENT: No sore throat. No congestion or rhinorrhea. Cardiovascular: Denies chest pain. Denies palpitations. Respiratory: Denies shortness of breath.  No cough. Gastrointestinal: No abdominal pain.  No nausea, no vomiting.  No diarrhea.  No constipation. Genitourinary: Negative for dysuria. Musculoskeletal: Negative for back pain. Skin: Also for diffuse skin rash, most prominent on the groin and buttock. Neurological: Negative for headaches. No focal numbness, tingling or weakness.     ____________________________________________   PHYSICAL EXAM:  VITAL SIGNS: ED Triage Vitals  Enc Vitals Group     BP 05/01/18 1320 (!) 150/113     Pulse Rate 05/01/18 1320 76     Resp 05/01/18 1320 16     Temp 05/01/18 1320 (!) 97.5 F (36.4 C)     Temp Source 05/01/18 1320 Oral     SpO2 05/01/18 1320 96 %     Weight 05/01/18 1322  180 lb (81.6 kg)     Height 05/01/18 1322 5\' 10"  (1.778 m)     Head Circumference --      Peak Flow --      Pain Score 05/01/18 1321 8     Pain Loc --      Pain Edu? --      Excl. in GC? --     Constitutional: Alert and oriented.  Answers questions appropriately.  Uncomfortable appearing. Eyes: Conjunctivae are normal.  EOMI. No scleral icterus. Head: Atraumatic. Nose: No congestion/rhinnorhea. Mouth/Throat: Mucous membranes are moist.  Neck: No stridor.  Supple.  JVD.  No meningismus. Cardiovascular: Normal rate,  regular rhythm. No murmurs, rubs or gallops.  Respiratory: Normal respiratory effort.  No accessory muscle use or retractions. Lungs CTAB.  No wheezes, rales or ronchi. Gastrointestinal: Soft, nontender and nondistended.  No guarding or rebound.  No peritoneal signs. Musculoskeletal: No LE edema.  Neurologic:  A&Ox3.  Speech is clear.  Face and smile are symmetric.  EOMI.  Moves all extremities well. Skin: Large areas of skin breakdown with open wounds without significant purulence but over riding erythema.  Most prominent on the buttocks and groin areas. Psychiatric: Mood and affect are normal.____________________________________________   LABS (all labs ordered are listed, but only abnormal results are displayed)  Labs Reviewed  CBC - Abnormal; Notable for the following components:      Result Value   WBC 14.1 (*)    RBC 3.96 (*)    Hemoglobin 11.7 (*)    HCT 36.6 (*)    RDW 15.6 (*)    All other components within normal limits  BASIC METABOLIC PANEL - Abnormal; Notable for the following components:   Glucose, Bld 112 (*)    All other components within normal limits  URINALYSIS, COMPLETE (UACMP) WITH MICROSCOPIC   ____________________________________________  EKG  Not indicated ____________________________________________  RADIOLOGY  No results found.  ____________________________________________   PROCEDURES  Procedure(s) performed: None  Procedures  Critical Care performed: No ____________________________________________   INITIAL IMPRESSION / ASSESSMENT AND PLAN / ED COURSE  Pertinent labs & imaging results that were available during my care of the patient were reviewed by me and considered in my medical decision making (see chart for details).  68 y.o. male just discharged after sepsis from cellulitis presenting with inability to care for himself.  We will check basic laboratory studies to see if he has any acute sequelae from not eating or drinking  including severe electrolyte disturbance.  A UA has also been sent.  I anticipate the patient will require social work consult if he is not admitted for placement.  I have restarted his 3 times daily clindamycin regimen for his skin breakdown.  ----------------------------------------- 3:16 PM on 05/01/2018 -----------------------------------------  The patient's laboratory studies are not consistent with any severe dehydration, and his elevated white blood cell count is to be expected given his recent and ongoing infection.  I have ordered his home medications and place a social work and wound care consult.  The patient has been signed out to Dr. Fanny Bien, the oncoming physician, for ongoing disposition.  ____________________________________________  FINAL CLINICAL IMPRESSION(S) / ED DIAGNOSES  Final diagnoses:  Failure to thrive (0-17)  Cellulitis, unspecified cellulitis site         NEW MEDICATIONS STARTED DURING THIS VISIT:  New Prescriptions   No medications on file      Rockne Menghini, MD 05/01/18 1517

## 2018-05-01 NOTE — ED Notes (Signed)
Pt moved to a regular hospital bed and positioned on his left side. Pt has call light, urinal and tv remote

## 2018-05-01 NOTE — Clinical Social Work Note (Addendum)
Clinical Social Worker (CSW) received consult for "7050." CSW reviewed patient chart. Patient from home alone, wheelchair bound, and has bedsore. Patient was just admitted to the hospital on 1/15 for sepsis and discharged on 1/18 with home health services (RN, P/T, and nurse's aid) through [facility]. Patient was previously in short term rehab at [facility] twice in October & November of 2019. Per notes, patient's living conditions are poor, having no running water and only 1 space heater.  [place] staffed with EDP Dr. Jacqualine Code.   CSW met with patient at bedside. CSW introduced self and explained role. Patient confirmed he lives alone, but has a male friend that he pays to help patient write checks for bills, prep some meals, and assist with other tasks, as needed. Patient stated he has a son that lives locally but "doesn't want to bother him" because son told him "you're not my full-time job." Patient stated no other family. Patient feel that if he can get short-term rehab, he would be able to live on his own. CSW explained that patient would have to pay for rehab privately, as was in his co-pay days during his last rehab stay and has not been out for his complete period of wellness. CSW discussed with patient long-term care. Patient initially not agreeable due to having other bills to pay." CSW and EDP Dr. Jacqualine Code spoke with patient together and reiterated the unsafe conditions of the home and patient's need for a higher level of care. Patient agreeable to long-term care at this point. CSW provided patient a list of long-term care Skilled Nursing Facilities in the area. Patient's preference is [facility]. CSW to complete FL-2 and send out referral to local facilities for a Medicaid bed. CSW also called Adult Protective Services (APS) after hours. Awaiting a call back to file an APS report. CSW continuing to follow for transitions of care.   Alejandro Cook, Alejandro Cook, [place] Worker-Emergency  Department [phone]

## 2018-05-01 NOTE — ED Notes (Signed)
Regular diet was ordered for pt.

## 2018-05-01 NOTE — Consult Note (Addendum)
WOC Nurse wound consult note Consult requested for multiple areas of skin breakdown.  Pt is familiar to [facility] team from recent admission; refer to progress notes on 1/16 for assessment and measurements: Reason for Consult: Large area of moisture associated skin damage; buttocks, posterior and medial thighs, left hip, inguinal and scrotal/perineal. Partial thickness and full thickness wound (full thickness in right inguinal) Right inguinal wound measures 2cm x 8cm x 0.2cm with 60% yellow fibrinous slough, 40% red, moist tissue. Small to moderate amount of serous to light yellow exudate that is trapped in inguinal skin fold. Other areas are partial thickness with moist wound bed, red/pink and scant to small amounts of serous exudate Periwound: erythematous, indurated and edematous Dressing procedure/placement/frequency: Air mattress replacement with low air loss feature to assist in the microclimate management for this patient, specifically to dry the macerated skin with prolonged exposure to dual incontinence including erythema and partial thickness tissue loss. Bilateral heel boots to reduce pressure injury. Barrier cream to repel moisture and promote healing (Gerhart's Butt Cream), Our house antimicrobial textile ([employer]+) will be used to treat the right inguinal area wound and will wick away excess moisture.  Pt could benefit from asystemic antifungal (e.g., diflucan) to promote healing; please order if desired. WOC team will plan to reassess skin tomorrow am. Cammie Mcgee MSN, RN, [place], [place], CNS [phone]

## 2018-05-01 NOTE — Evaluation (Signed)
 Physical Therapy Evaluation Patient Details Name: Alejandro ClintonJohn S Cook MRN: [id number] DOB: April 28, 1950 Today's Date: 05/01/2018   History of Present Illness  68 y.o. male who was here in the ED last week with cellulitis, pressure sores.  Patient is status post ORIF of a left proximal tibia fracture on 01/16/2018.  Patient has had difficulty at home.  He has been sleeping in his wheelchair.  Likely because of this the patient has developed skin breakdown the posterior calf likely from continuous pressure from the wheelchair leg rest.  Pt has AVN of the left hip as well as advanced osteoarthritis of the left knee in addition to his tibial fracture.  He is not longer able to transfer and he has minimal assitance at home, it appears the home situation is very unsafe.  Clinical Impression  Very tough situation as pt has not been w/c bound since L tib/fib fx 3 months ago, apparently he went to rehab and was able to transfer and manage at home but has had a harder and harder time that now results in very poor home condition, multiple pressure sores, b/l knee flexion contractures, severe OA crepitus (and hip AVN) general inability to care for himself.  Overall this is an unfortunate situation and at this time pt is completely unsafe at home and unable to care for himself.  Again a difficult situation and pt will need rehab.    Follow Up Recommendations SNF    Equipment Recommendations  None recommended by PT    Recommendations for Other Services       Precautions / Restrictions Restrictions Weight Bearing Restrictions: No      Mobility  Bed Mobility               General bed mobility comments: pt calling out in pain with even minimal attepts at movement.  Unable to tolerate max assist getting to EOB  Transfers                 General transfer comment: Pt has not stood in months, unable to even get to sitting  Ambulation/Gait                Stairs            Wheelchair  Mobility    Modified Rankin (Stroke Patients Only)       Balance Overall balance assessment: (unable to get to sitting, presumably he has zero balance)                                           Pertinent Vitals/Pain Pain Assessment: 0-10 Pain Score: 9  Pain Location: t/o b/l hip and knee joints, sacral and R heel ulcer, and general overall pain    Home Living Family/patient expects to be discharged to:: Skilled nursing facility Living Arrangements: Alone Available Help at Discharge: Family;Available PRN/intermittently Type of Home: House Home Access: Ramped entrance     Home Layout: Able to live on main level with bedroom/bathroom;Two level Home Equipment: Walker - 2 wheels;Walker - standard;Bedside commode;Shower seat;Wheelchair - manual      Prior Function Level of Independence: Needs assistance   Gait / Transfers Assistance Needed: Pt has not been ambulating with RW since recent d/c from SNF and has been receiving HHPT.  Prior to recent surgery Pt ambulated household and community distances with 2WW.     Comments: pt's home situation  has deteriorated signficantly in the recent past     Hand Dominance        Extremity/Trunk Assessment   Upper Extremity Assessment Upper Extremity Assessment: Generalized weakness(grossly 3+/5)    Lower Extremity Assessment Lower Extremity Assessment: Generalized weakness(lacks TKE b/l, crepitus in all joints, poor strength t/o)       Communication   Communication: No difficulties  Cognition Arousal/Alertness: Awake/alert Behavior During Therapy: Restless Overall Cognitive Status: Within Functional Limits for tasks assessed                                 General Comments: Pt tearful about situation       General Comments      Exercises     Assessment/Plan    PT Assessment Patient needs continued PT services  PT Problem List Decreased strength;Decreased activity tolerance;Decreased  range of motion;Decreased balance;Decreased mobility;Decreased knowledge of use of DME;Decreased safety awareness;Decreased knowledge of precautions;Pain       PT Treatment Interventions DME instruction;Gait training;Functional mobility training;Therapeutic activities;Therapeutic exercise;Balance training;Neuromuscular re-education;Patient/family education    PT Goals (Current goals can be found in the Care Plan section)  Acute Rehab PT Goals Patient Stated Goal: get back to walking PT Goal Formulation: With patient Time For Goal Achievement:  Potential to Achieve Goals: Poor    Frequency Min 2X/week   Barriers to discharge        Co-evaluation               AM-PAC PT "6 Clicks" Mobility  Outcome Measure Help needed turning from your back to your side while in a flat bed without using bedrails?: Total Help needed moving from lying on your back to sitting on the side of a flat bed without using bedrails?: Total Help needed moving to and from a bed to a chair (including a wheelchair)?: Total Help needed standing up from a chair using your arms (e.g., wheelchair or bedside chair)?: Total Help needed to walk in hospital room?: Total Help needed climbing 3-5 steps with a railing? : Total 6 Click Score: 6    End of Session   Activity Tolerance: Patient limited by pain Patient left: in bed;with call bell/phone within reach Nurse Communication: Mobility status PT Visit Diagnosis: Muscle weakness (generalized) (M62.81);Difficulty in walking, not elsewhere classified (R26.2);Pain Pain - Right/Left: Left Pain - part of body: Hip    Time: [id number] PT Time Calculation (min) (ACUTE ONLY): 31 min   Charges:   PT Evaluation $PT Eval Moderate Complexity: 1 Mod          Malachi ProGalen R Danarius Mcconathy, DPT 05/01/2018, 6:40 PM

## 2018-05-02 DIAGNOSIS — R627 Adult failure to thrive: Secondary | ICD-10-CM | POA: Diagnosis not present

## 2018-05-02 MED ORDER — BACITRACIN ZINC 500 UNIT/GM EX OINT
TOPICAL_OINTMENT | Freq: Two times a day (BID) | CUTANEOUS | Status: DC
  Administered 2018-05-02: 1 via TOPICAL
  Filled 2018-05-02: qty 0.9

## 2018-05-02 NOTE — NC FL2 (Signed)
[place] MEDICAID FL2 LEVEL OF CARE SCREENING TOOL     IDENTIFICATION  Patient Name: Alejandro Cook Birthdate: October 06, 1950 Sex: male Admission Date (Current Location): 05/01/2018  [place] and [place] Number:  Haydee Monica [id number] [facility] Facility and Address:  [facility], [address], [place], [place] [address]      Provider Number: [id number]  Attending Physician Name and Address:  No att. providers found  Relative Name and Phone Number:       Current Level of Care: Hospital Recommended Level of Care: Skilled Nursing Facility Prior Approval Number:    Date Approved/Denied:   PASRR Number: [id number]  Discharge Plan: SNF    Current Diagnoses: Patient Active Problem List   Diagnosis Date Noted  . Sepsis (HCC) 04/25/2018  . Cellulitis 03/02/2018  . Ileus (HCC)   . Ogilvie's syndrome   . Large bowel obstruction (HCC) 01/23/2018  . Hernia of abdominal wall 01/23/2018  . Tibia fracture 01/14/2018    Orientation RESPIRATION BLADDER Height & Weight     Self, Time, Situation, Place  Normal Continent(Needs assistance with cleaning/bedpan) Weight: 81.6 kg Height:  5\' 10"  (177.8 cm)  BEHAVIORAL SYMPTOMS/MOOD NEUROLOGICAL BOWEL NUTRITION STATUS      Continent(Needs assistance with cleaning/bedpan) Diet  AMBULATORY STATUS COMMUNICATION OF NEEDS Skin   Extensive Assist Verbally PU Stage and Appropriate Care, Bruising, Skin abrasions(Bilateral heels) PU Stage 1 Dressing: No Dressing(Buttocks)                     Personal Care Assistance Level of Assistance  Bathing, Feeding, Dressing Bathing Assistance: Maximum assistance Feeding assistance: Limited assistance Dressing Assistance: Maximum assistance     Functional Limitations Info  Sight, Hearing, Speech Sight Info: Adequate Hearing Info: Adequate Speech Info: Adequate    SPECIAL CARE FACTORS FREQUENCY                       Contractures Contractures Info: Not present     Additional Factors Info  Code Status, Allergies, Psychotropic Code Status Info: Full Allergies Info: No Known Allergies Psychotropic Info: See med list         Current Medications (05/02/2018):  This is the current hospital active medication list Current Facility-Administered Medications  Medication Dose Route Frequency Provider Last Rate Last Dose  . bacitracin ointment   Topical BID Rockne Menghini, MD   1 application at 05/02/18 1014  . clindamycin (CLEOCIN) capsule 300 mg  300 mg Oral Q8H Rockne Menghini, MD   300 mg at 05/02/18 0646  . diclofenac (VOLTAREN) EC tablet 75 mg  75 mg Oral BID Rockne Menghini, MD   75 mg at 05/02/18 1013  . DULoxetine (CYMBALTA) DR capsule 60 mg  60 mg Oral Daily Rockne Menghini, MD   60 mg at 05/02/18 1013  . fluconazole (DIFLUCAN) tablet 100 mg  100 mg Oral Daily Rockne Menghini, MD   100 mg at 05/02/18 1013  . Gerhardt's butt cream   Topical BID Rockne Menghini, MD      . lisinopril (PRINIVIL,ZESTRIL) tablet 20 mg  20 mg Oral Daily Sharyn Creamer, MD   20 mg at 05/02/18 1012   And  . hydrochlorothiazide (MICROZIDE) capsule 12.5 mg  12.5 mg Oral Daily Sharyn Creamer, MD   12.5 mg at 05/02/18 1012  . HYDROcodone-acetaminophen (NORCO/VICODIN) 5-325 MG per tablet 1 tablet  1 tablet Oral Q6H PRN Sharyn Creamer, MD   1 tablet at 05/02/18 1013  . pravastatin (PRAVACHOL) tablet 40 mg  40 mg Oral q1800 Rockne Menghini, MD   40 mg at 05/01/18 2318   Current Outpatient Medications  Medication Sig Dispense Refill  . clindamycin (CLEOCIN) 300 MG capsule Take 1 capsule (300 mg total) by mouth 3 (three) times daily for 7 days. 21 capsule 0  . diclofenac (VOLTAREN) 75 MG EC tablet Take 1 tablet by mouth 2 (two) times daily.    . DULoxetine (CYMBALTA) 60 MG capsule Take 1 capsule by mouth daily.    . fluconazole (DIFLUCAN) 100 MG tablet Take 1 tablet (100 mg total) by mouth daily for 7 days. 7 tablet 0  . Hydrocortisone  (GERHARDT'S BUTT CREAM) CREA Apply 1 application topically 4 (four) times daily for 21 days. 84 each 0  . lisinopril-hydrochlorothiazide (PRINZIDE,ZESTORETIC) 20-12.5 MG tablet Take 1 tablet by mouth daily.    Marland Kitchen lovastatin (MEVACOR) 40 MG tablet Take 1 tablet (40 mg total) by mouth daily.       Discharge Medications: Please see discharge summary for a list of discharge medications.  Relevant Imaging Results:  Relevant Lab Results:   Additional Information SSN: [id number]  Dominic Pea, LCSW

## 2018-05-02 NOTE — ED Notes (Signed)
Pt states he has called for help to turn side to side during the night

## 2018-05-02 NOTE — ED Notes (Signed)
Social work to bedside at this time. 

## 2018-05-02 NOTE — ED Notes (Signed)
Pt resting comfortably at this time.

## 2018-05-02 NOTE — ED Notes (Signed)
Breakfast tray given at this time, pt eating

## 2018-05-02 NOTE — ED Notes (Signed)
Turned to left side

## 2018-05-02 NOTE — Consult Note (Addendum)
WOC follow-up: Refer to previous consult note on 1/21.  Skin assessment completed today; patient is in the ED awaiting placement to an LTAC and has been placed in a hospital bed.  Bilat buttocks/sacrum/perineum are red, moist and macerated with patchy areas of partial thickness skin loss; appearance is consistent with moisture associated skin damage and probable candidiasis from previously noted incontinence of urine and stool.  Left buttock with linear shaped dark red-purple deep tissue injury; 15X2cm Right buttock with stage 1 area in the middle of the MASD; 3X3cm Left heel 3X5cm with dark purple deep tissue injury; intact blood-filled blister Right heel with dark red-purple deep tissue injury; 4X5cm with intact skin Patchy areas of dark red partial thickness skin loss abrasions to bilat hads and legs, dry without odor or drainage. Plan: Prevalon boots to reduce pressure to bilat heels.  Air mattress to reduce pressure when room is available.  Barrier cream and antifungal powder to buttocks and perineum to promote healing. Antibiotic ointment has already been ordered for hand and leg abrasions. Discussed plan of care with patient; no family members at the bedside. Please re-consult if further assistance is needed.  Thank-you,  Cammie Mcgee MSN, RN, CWOCN, [place], CNS [phone]

## 2018-05-02 NOTE — ED Notes (Signed)
prevelon boots applied to bilat lower extremities at this time

## 2018-05-02 NOTE — Clinical Social Work Note (Addendum)
Clinical Child psychotherapistocial Worker (CSW) staffed with EDRN [place]. CSW completed FL-2 and sent out referral to local facilities for a Medicaid bed. CSW called [facility] (SNF) and spoke with Tammy in admissions and Judeth CornfieldStephanie in the business office. Judeth CornfieldStephanie stated patient has only had 51 days of the 60 wellness days need for Medicare to pay at 100% (first 20 days). Tammy stated they do not have any Medicaid beds available. CSW also called/left voicemail for [facility], as CSW never received a call back from yesterday's call.    UPDATE: CS[facility] received [facility]. [facility] stating Director is considering extending a bed offer to patient and will call CSW back with an answer.   UPDATE: CSW received call from Director of Nursing Dorene Arina Russell who confirmed they can accept patient long term. Patient will be discharging to [facility] for long term care placement. CSW updated EDP Dr. Don PerkingVeronese and Leola BrazilEDRN Ashley S. Patient made aware of discharge plans and agreeable. CSW retrieved patient's phone from his bag, at his request, for patient to call his friend Margaretha GlassingLoretta. [facility]. EDRN Morrie Sheldonshley to call report to [phone] and ask for RN on [address]. CSW sent all documents and accepted facility in the HUB. CSW to fax discharge summary to Inetta Fermoina at fax#: [phone]. ED Secretary to arrange EMS transport. CSW signing off as no further Social Work needs identified.   Corlis HoveJeneya Aeisha Minarik, Theresia MajorsLCSWA, [facility] Clinical Social Worker-Emergency Department [phone]

## 2018-05-02 NOTE — ED Notes (Signed)
Pt provided lunch tray; pt repositioned in bed and able to feed self.

## 2018-05-02 NOTE — ED Provider Notes (Signed)
-----------------------------------------   7:03 AM on 05/02/2018 -----------------------------------------   Blood pressure 128/68, pulse 80, temperature 98.9 F (37.2 C), temperature source Rectal, resp. rate 14, height 1.778 m (5\' 10" ), weight 81.6 kg, SpO2 96 %.  The patient is calm and cooperative at this time.  There have been no acute events since the last update.  Awaiting disposition plan from social work.   Alejandro Cook, Shakerra Red, MD 05/02/18 [phone]

## 2018-06-10 DEATH — deceased

## 2019-09-19 IMAGING — DX DG KNEE 1-2V PORT*L*
2 series · 2 of 2 positions shown · non-contrast
Comparison: Prior studies

CLINICAL DATA: ORIF proximal tibial fracture.

EXAM:
PORTABLE LEFT KNEE - 1-2 VIEW

[knee ap]
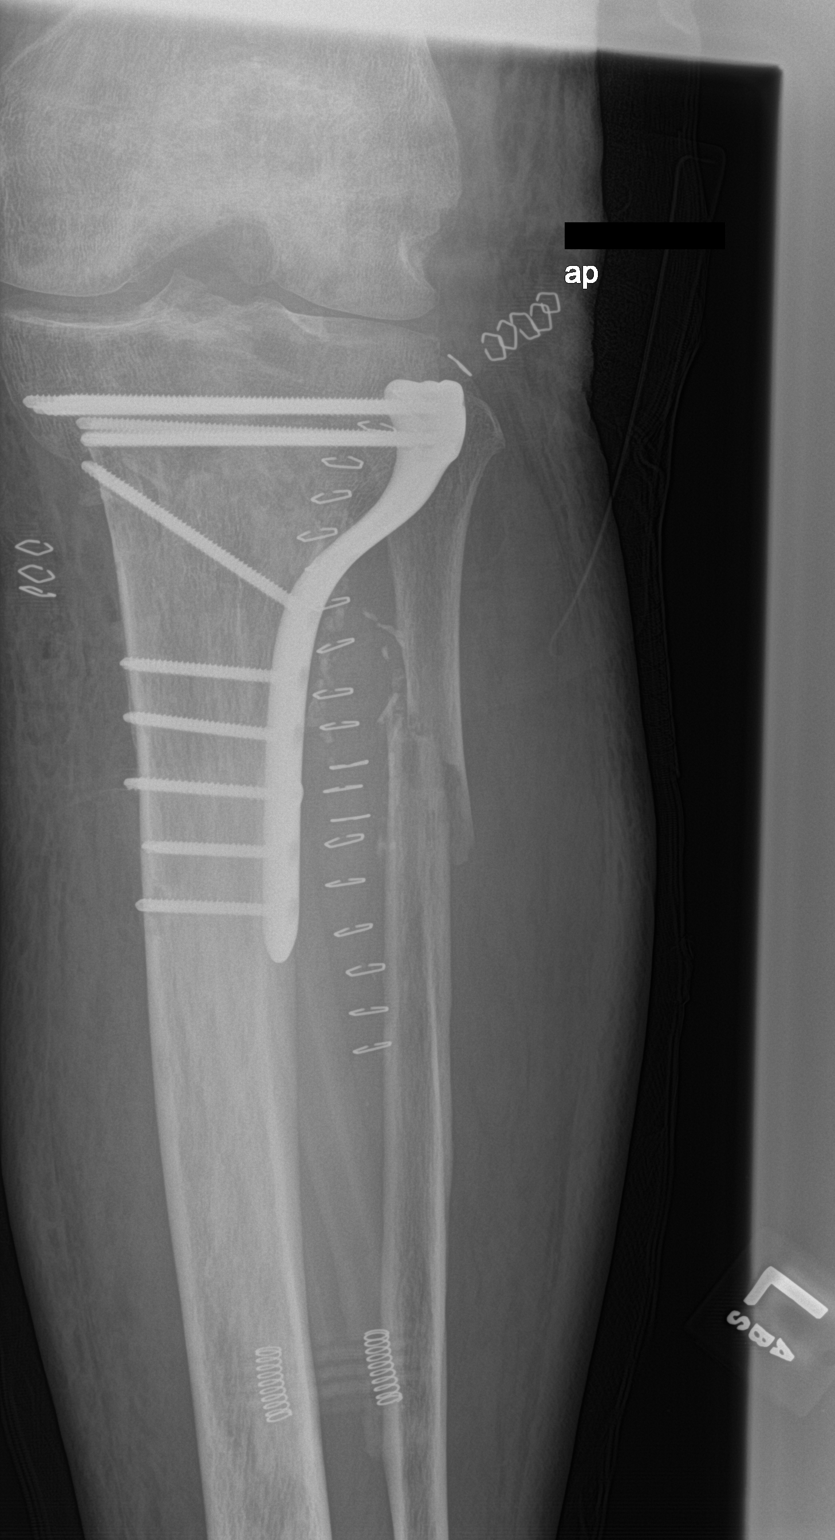

[knee lat]
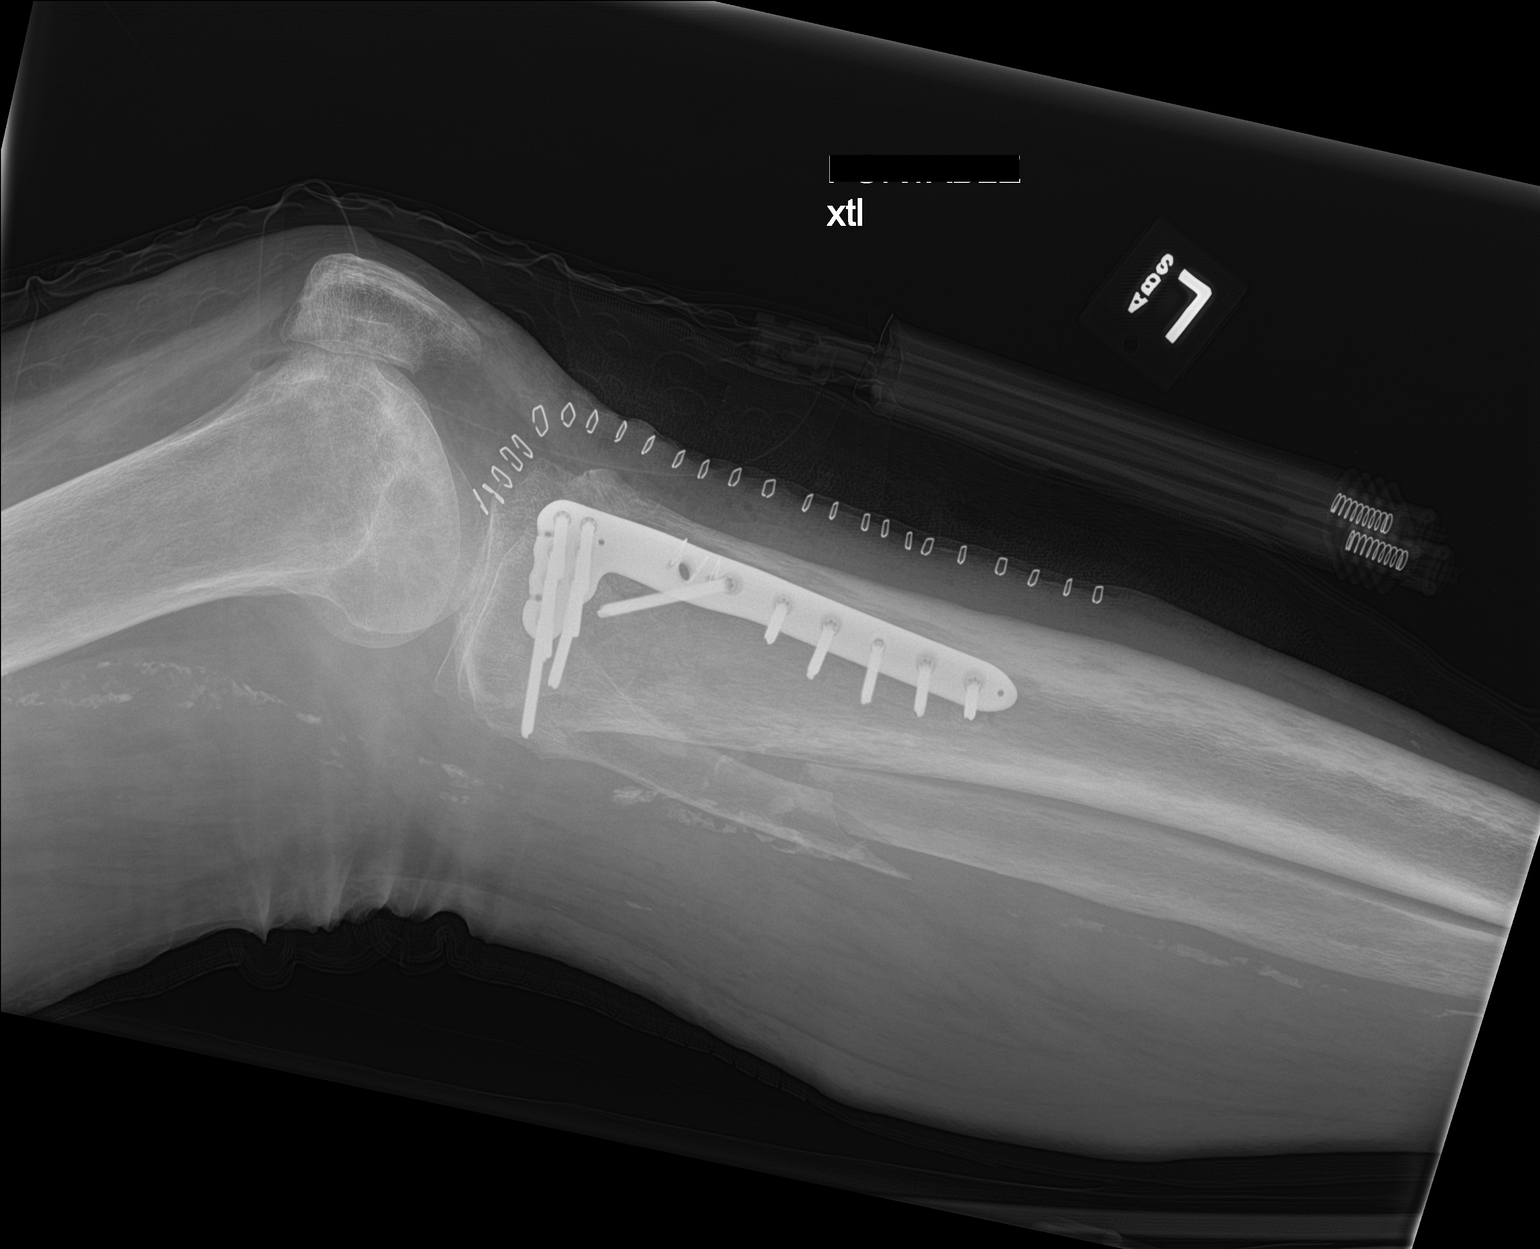

[2 of 2 positions shown; findings below may reference images not displayed]

FINDINGS: Internal plate screw fixation noted traversing a proximal tibial
fracture, in near-anatomic alignment and position.

A mildly displaced proximal fibular fracture is identified.

No complicating features are identified.
IMPRESSION: ORIF proximal tibial fracture, in near-anatomic alignment and
position.
